# Patient Record
Sex: Male | Born: 1955
Health system: Southern US, Community
[De-identification: ages and names within clinical notes are randomized; demographics above are authoritative.]

## PROBLEM LIST (undated history)

## (undated) DIAGNOSIS — Z9289 Personal history of other medical treatment: Secondary | ICD-10-CM

## (undated) DIAGNOSIS — M1990 Unspecified osteoarthritis, unspecified site: Secondary | ICD-10-CM

## (undated) DIAGNOSIS — C61 Malignant neoplasm of prostate: Secondary | ICD-10-CM

## (undated) DIAGNOSIS — E785 Hyperlipidemia, unspecified: Secondary | ICD-10-CM

## (undated) DIAGNOSIS — T4145XA Adverse effect of unspecified anesthetic, initial encounter: Secondary | ICD-10-CM

## (undated) DIAGNOSIS — I2510 Atherosclerotic heart disease of native coronary artery without angina pectoris: Secondary | ICD-10-CM

## (undated) DIAGNOSIS — I519 Heart disease, unspecified: Secondary | ICD-10-CM

## (undated) DIAGNOSIS — I319 Disease of pericardium, unspecified: Secondary | ICD-10-CM

## (undated) DIAGNOSIS — C439 Malignant melanoma of skin, unspecified: Secondary | ICD-10-CM

## (undated) DIAGNOSIS — T8859XA Other complications of anesthesia, initial encounter: Secondary | ICD-10-CM

## (undated) DIAGNOSIS — K51919 Ulcerative colitis, unspecified with unspecified complications: Secondary | ICD-10-CM

## (undated) DIAGNOSIS — D500 Iron deficiency anemia secondary to blood loss (chronic): Secondary | ICD-10-CM

## (undated) HISTORY — DX: Ulcerative colitis, unspecified with unspecified complications: K51.919

## (undated) HISTORY — PX: MOHS SURGERY: SUR867

## (undated) HISTORY — PX: HIP ARTHROPLASTY: SHX981

## (undated) HISTORY — DX: Personal history of other medical treatment: Z92.89

## (undated) HISTORY — DX: Iron deficiency anemia secondary to blood loss (chronic): D50.0

## (undated) HISTORY — PX: CERVICAL SPINE SURGERY: SHX589

## (undated) HISTORY — PX: TONSILLECTOMY: SUR1361

---

## 2006-12-20 HISTORY — PX: CORONARY ANGIOPLASTY WITH STENT PLACEMENT: SHX49

## 2014-09-13 ENCOUNTER — Encounter: Payer: Self-pay | Admitting: Internal Medicine

## 2014-09-13 ENCOUNTER — Ambulatory Visit (INDEPENDENT_AMBULATORY_CARE_PROVIDER_SITE_OTHER): Payer: BC Managed Care – PPO | Admitting: Internal Medicine

## 2014-09-13 VITALS — BP 136/80 | HR 67 | Ht 64.0 in | Wt 184.0 lb

## 2014-09-13 DIAGNOSIS — I2510 Atherosclerotic heart disease of native coronary artery without angina pectoris: Secondary | ICD-10-CM

## 2014-09-13 DIAGNOSIS — E785 Hyperlipidemia, unspecified: Secondary | ICD-10-CM

## 2014-09-13 LAB — CBC
HEMATOCRIT: 39.8 % (ref 39.0–52.0)
HEMOGLOBIN: 13 g/dL (ref 13.0–17.0)
MCHC: 32.6 g/dL (ref 30.0–36.0)
MCV: 88.8 fl (ref 78.0–100.0)
PLATELETS: 312 10*3/uL (ref 150.0–400.0)
RBC: 4.48 Mil/uL (ref 4.22–5.81)
RDW: 14.3 % (ref 11.5–15.5)
WBC: 6.6 10*3/uL (ref 4.0–10.5)

## 2014-09-13 LAB — BASIC METABOLIC PANEL
BUN: 13 mg/dL (ref 6–23)
CHLORIDE: 106 meq/L (ref 96–112)
CO2: 26 mEq/L (ref 19–32)
Calcium: 9.4 mg/dL (ref 8.4–10.5)
Creatinine, Ser: 0.8 mg/dL (ref 0.4–1.5)
GFR: 104.11 mL/min (ref >60.00)
Glucose, Bld: 107 mg/dL — ABNORMAL HIGH (ref 70–99)
Potassium: 4.1 mEq/L (ref 3.5–5.1)
SODIUM: 138 meq/L (ref 135–145)

## 2014-09-13 LAB — LIPID PANEL
Cholesterol: 127 mg/dL (ref 0–200)
HDL: 36.8 mg/dL — AB (ref >39.00)
LDL Cholesterol: 65 mg/dL (ref 0–99)
NonHDL: 90.2
TRIGLYCERIDES: 127 mg/dL (ref 0.0–149.0)
Total CHOL/HDL Ratio: 3
VLDL: 25.4 mg/dL (ref 0.0–40.0)

## 2014-09-13 LAB — HEPATIC FUNCTION PANEL
ALBUMIN: 4 g/dL (ref 3.5–5.2)
ALK PHOS: 80 U/L (ref 39–117)
ALT: 25 U/L (ref 0–53)
AST: 24 U/L (ref 0–37)
BILIRUBIN DIRECT: 0.1 mg/dL (ref 0.0–0.3)
TOTAL PROTEIN: 7.3 g/dL (ref 6.0–8.3)
Total Bilirubin: 0.7 mg/dL (ref 0.2–1.2)

## 2014-09-13 MED ORDER — ASPIRIN EC 81 MG PO TBEC: 81.0000 mg | DELAYED_RELEASE_TABLET | Freq: Every day | ORAL | Status: DC

## 2014-09-13 NOTE — Progress Notes (Signed)
HPI Pateint is a 58 yo who relocated to [facility]  Had cardiologist in [place] Pain chest and jaw prior to cath in 2008  Admitted  :L heart cath done  Showed 50% LAD  90% D1; 100% RCA  LVEF 55%  Patient underwent PCI/stent Has had nuclear stress test since.  Normal Remained on chronic ASA and Plavix.    Memory problems a few yrs ago   Lasted about 30 min.  Testing done.  No recurrence  Hx of ulcerative colitis  Mild flare recently   Doubled up on asacol  Denies CP  No SOB  No dizziness  No palp Knees limit activity  Had hip replaced 1 year ago.  Has appt with Alvan Dame Also has appt with Cleaster Corin for UC No Known Allergies  Current Outpatient Prescriptions  Medication Sig Dispense Refill  . aspirin EC 325 MG tablet Take 325 mg by mouth daily.      . clopidogrel (PLAVIX) 75 MG tablet Take 75 mg by mouth daily.      Marland Kitchen desloratadine (CLARINEX) 5 MG tablet Take 5 mg by mouth daily.      . mesalamine (ASACOL) 400 MG EC tablet Take 400 mg by mouth 2 (two) times daily.      . metoprolol tartrate (LOPRESSOR) 25 MG tablet Take 1/2 tablet by mouth daily      . Multiple Vitamins-Minerals (CENTRUM SILVER PO) Take by mouth. 1 tablet daily      . Omega-3 Fatty Acids (FISH OIL) 1200 MG CAPS Take by mouth. Take 1 tablet by mouth twice a day      . simvastatin (ZOCOR) 40 MG tablet Take 40 mg by mouth daily.       No current facility-administered medications for this visit.    No past medical history on file.  No past surgical history on file.  History reviewed. No pertinent family history.  History   Social History  . Marital Status: Married    Spouse Name: N/A    Number of Children: N/A  . Years of Education: N/A   Occupational History  . Not on file.   Social History Main Topics  . Smoking status: Never Smoker   . Smokeless tobacco: Not on file  . Alcohol Use: Yes  . Drug Use: No  . Sexual Activity: Not on file   Other Topics Concern  . Not on file   Social History Narrative  . No  narrative on file    Review of Systems:  All systems reviewed.  They are negative to the above problem except as previously stated.  Vital Signs: BP 136/80  Pulse 67  Ht 5' 4"  (1.626 m)  Wt 184 lb (83.462 kg)  BMI 31.57 kg/m2  Physical Exam Patient is in AND  HEENT:  Normocephalic, atraumatic. EOMI, PERRLA.  Neck: JVP is normal.  No bruits.  Lungs: clear to auscultation. No rales no wheezes.  Heart: Regular rate and rhythm. Normal S1, S2. No S3.   No significant murmurs. PMI not displaced.  Abdomen:  Supple, nontender. Normal bowel sounds. No masses. No hepatomegaly.  Extremities:   Good distal pulses throughout. No lower extremity edema.  Musculoskeletal :moving all extremities.  Neuro:   alert and oriented x3.  CN II-XII grossly intact.  EKG  SR 67    Assessment and Plan:  1.  CAD  Doing well  Stay on ASA and Plavix  Cut ASA to 81 Check CBC  2.  HL  Check labs  3  DJD  Appt with ortho soon  If needs surgery I think he is OK to proceed with it from a cardiac standpoint  4  UC>  GI appt soon  5.  Question TIA  Few years ago  Nothing since  Follow    Recomm he increae his activity as tolerated.  Bikd  Pool  Will f/u in 1 year

## 2014-09-13 NOTE — Patient Instructions (Signed)
Your physician has recommended you make the following change in your medication:  1.) change aspirin to 81 mg tablet (enteric coated) once daily Your physician recommends that you return for lab work today (bmet, cbc, lipid, liver) Your physician wants you to follow-up in: 1 year with Dr. Harrington Challenger.  You will receive a reminder letter in the mail two months in advance. If you don't receive a letter, please call our office to schedule the follow-up appointment.

## 2014-10-03 ENCOUNTER — Other Ambulatory Visit: Payer: Self-pay

## 2014-10-03 MED ORDER — METOPROLOL TARTRATE 25 MG PO TABS: ORAL_TABLET | ORAL | Status: DC

## 2014-12-09 ENCOUNTER — Other Ambulatory Visit: Payer: Self-pay | Admitting: *Deleted

## 2014-12-09 MED ORDER — SIMVASTATIN 40 MG PO TABS: 40.0000 mg | ORAL_TABLET | Freq: Every day | ORAL | Status: DC

## 2015-02-21 ENCOUNTER — Other Ambulatory Visit: Payer: Self-pay

## 2015-02-21 MED ORDER — CLOPIDOGREL BISULFATE 75 MG PO TABS: 75.0000 mg | ORAL_TABLET | Freq: Every day | ORAL | Status: DC

## 2015-05-21 ENCOUNTER — Encounter (HOSPITAL_COMMUNITY): Payer: Self-pay | Admitting: *Deleted

## 2015-05-21 ENCOUNTER — Emergency Department (HOSPITAL_COMMUNITY)
Admission: EM | Admit: 2015-05-21 | Discharge: 2015-05-21 | Disposition: A | Discharge status: Home / Self Care | Payer: 59 | Attending: Emergency Medicine | Admitting: Emergency Medicine

## 2015-05-21 ENCOUNTER — Emergency Department (HOSPITAL_COMMUNITY): Payer: 59

## 2015-05-21 DIAGNOSIS — Z7902 Long term (current) use of antithrombotics/antiplatelets: Secondary | ICD-10-CM | POA: Diagnosis not present

## 2015-05-21 DIAGNOSIS — R109 Unspecified abdominal pain: Secondary | ICD-10-CM

## 2015-05-21 DIAGNOSIS — E876 Hypokalemia: Secondary | ICD-10-CM | POA: Insufficient documentation

## 2015-05-21 DIAGNOSIS — K5190 Ulcerative colitis, unspecified, without complications: Secondary | ICD-10-CM | POA: Insufficient documentation

## 2015-05-21 DIAGNOSIS — K51919 Ulcerative colitis, unspecified with unspecified complications: Secondary | ICD-10-CM

## 2015-05-21 DIAGNOSIS — Z79899 Other long term (current) drug therapy: Secondary | ICD-10-CM | POA: Diagnosis not present

## 2015-05-21 DIAGNOSIS — Z7982 Long term (current) use of aspirin: Secondary | ICD-10-CM | POA: Insufficient documentation

## 2015-05-21 DIAGNOSIS — Z9861 Coronary angioplasty status: Secondary | ICD-10-CM | POA: Diagnosis not present

## 2015-05-21 LAB — COMPREHENSIVE METABOLIC PANEL
ALBUMIN: 3.3 g/dL — AB (ref 3.5–5.0)
ALT: 16 U/L — AB (ref 17–63)
AST: 14 U/L — AB (ref 15–41)
Alkaline Phosphatase: 66 U/L (ref 38–126)
Anion gap: 12 (ref 5–15)
BUN: 8 mg/dL (ref 6–20)
CHLORIDE: 102 mmol/L (ref 101–111)
CO2: 24 mmol/L (ref 22–32)
Calcium: 9 mg/dL (ref 8.9–10.3)
Creatinine, Ser: 0.73 mg/dL (ref 0.61–1.24)
GFR calc Af Amer: >60 mL/min (ref >60)
GFR calc non Af Amer: >60 mL/min (ref >60)
Glucose, Bld: 126 mg/dL — ABNORMAL HIGH (ref 65–99)
POTASSIUM: 3 mmol/L — AB (ref 3.5–5.1)
SODIUM: 138 mmol/L (ref 135–145)
Total Bilirubin: 0.7 mg/dL (ref 0.3–1.2)
Total Protein: 6.6 g/dL (ref 6.5–8.1)

## 2015-05-21 LAB — CBC WITH DIFFERENTIAL/PLATELET
Basophils Absolute: 0 10*3/uL (ref 0.0–0.1)
Basophils Relative: 0 % (ref 0–1)
EOS ABS: 0.1 10*3/uL (ref 0.0–0.7)
Eosinophils Relative: 1 % (ref 0–5)
HCT: 37.7 % — ABNORMAL LOW (ref 39.0–52.0)
HEMOGLOBIN: 12.7 g/dL — AB (ref 13.0–17.0)
Lymphocytes Relative: 14 % (ref 12–46)
Lymphs Abs: 1.3 10*3/uL (ref 0.7–4.0)
MCH: 28.5 pg (ref 26.0–34.0)
MCHC: 33.7 g/dL (ref 30.0–36.0)
MCV: 84.7 fL (ref 78.0–100.0)
Monocytes Absolute: 1.7 10*3/uL — ABNORMAL HIGH (ref 0.1–1.0)
Monocytes Relative: 18 % — ABNORMAL HIGH (ref 3–12)
Neutro Abs: 6.2 10*3/uL (ref 1.7–7.7)
Neutrophils Relative %: 67 % (ref 43–77)
Platelets: 375 10*3/uL (ref 150–400)
RBC: 4.45 MIL/uL (ref 4.22–5.81)
RDW: 12.7 % (ref 11.5–15.5)
WBC: 9.3 10*3/uL (ref 4.0–10.5)

## 2015-05-21 LAB — ABO/RH: ABO/RH(D): B POS

## 2015-05-21 LAB — LIPASE, BLOOD: LIPASE: 20 U/L — AB (ref 22–51)

## 2015-05-21 LAB — TYPE AND SCREEN
ABO/RH(D): B POS
Antibody Screen: NEGATIVE

## 2015-05-21 MED ORDER — ONDANSETRON 8 MG PO TBDP: 8.0000 mg | ORAL_TABLET | Freq: Three times a day (TID) | ORAL | Status: DC | PRN

## 2015-05-21 MED ORDER — OXYCODONE-ACETAMINOPHEN 5-325 MG PO TABS: 1.0000 | ORAL_TABLET | ORAL | Status: DC | PRN

## 2015-05-21 MED ORDER — IOHEXOL 300 MG/ML  SOLN
100.0000 mL | Freq: Once | INTRAMUSCULAR | Status: AC | PRN
  Administered 2015-05-21: 100 mL via INTRAVENOUS

## 2015-05-21 MED ORDER — ONDANSETRON HCL 4 MG/2ML IJ SOLN
4.0000 mg | Freq: Once | INTRAMUSCULAR | Status: AC
  Administered 2015-05-21: 4 mg via INTRAVENOUS
  Filled 2015-05-21: qty 2

## 2015-05-21 MED ORDER — POTASSIUM CHLORIDE CRYS ER 20 MEQ PO TBCR
40.0000 meq | EXTENDED_RELEASE_TABLET | Freq: Once | ORAL | Status: AC
  Administered 2015-05-21: 40 meq via ORAL
  Filled 2015-05-21: qty 2

## 2015-05-21 MED ORDER — IOHEXOL 300 MG/ML  SOLN: 25.0000 mL | Freq: Once | INTRAMUSCULAR | Status: DC | PRN

## 2015-05-21 MED ORDER — PREDNISONE 10 MG PO TABS: 60.0000 mg | ORAL_TABLET | Freq: Every day | ORAL | Status: DC

## 2015-05-21 MED ORDER — POTASSIUM CHLORIDE 10 MEQ/100ML IV SOLN
10.0000 meq | Freq: Once | INTRAVENOUS | Status: AC
  Administered 2015-05-21: 10 meq via INTRAVENOUS
  Filled 2015-05-21: qty 100

## 2015-05-21 MED ORDER — SODIUM CHLORIDE 0.9 % IV BOLUS (SEPSIS)
2000.0000 mL | Freq: Once | INTRAVENOUS | Status: AC
  Administered 2015-05-21: 2000 mL via INTRAVENOUS

## 2015-05-21 MED ORDER — HYDROMORPHONE HCL 1 MG/ML IJ SOLN
1.0000 mg | Freq: Once | INTRAMUSCULAR | Status: AC
  Administered 2015-05-21: 1 mg via INTRAVENOUS
  Filled 2015-05-21: qty 1

## 2015-05-21 MED ORDER — METHYLPREDNISOLONE SODIUM SUCC 125 MG IJ SOLR
125.0000 mg | Freq: Once | INTRAMUSCULAR | Status: AC
  Administered 2015-05-21: 125 mg via INTRAVENOUS
  Filled 2015-05-21: qty 2

## 2015-05-21 MED ORDER — HYDROMORPHONE HCL 1 MG/ML IJ SOLN
1.0000 mg | Freq: Once | INTRAMUSCULAR | Status: AC
Start: 2015-05-21 — End: 2015-05-21
  Administered 2015-05-21: 1 mg via INTRAVENOUS
  Filled 2015-05-21: qty 1

## 2015-05-21 NOTE — ED Provider Notes (Signed)
Pt reevaluated - has recurrent cramping pain and nausea - redosed with m eds - solumedrol added, pt in agreement with plan - fluids, meds and trial at home.  5:09 PM   Noemi Chapel, MD 05/21/15 1709

## 2015-05-21 NOTE — ED Provider Notes (Signed)
CSN: [id number]     Arrival date & time 05/21/15  1245 History   First MD Initiated Contact with Patient 05/21/15 1307     Chief Complaint  Patient presents with  . Rectal Bleeding  . Abdominal Pain      HPI Patient reports nausea and abdominal cramping over the past 10 days.  Is also reported some bright red blood from his rectum.  He has a history of ulcerative colitis or was recently started on increased prednisone.  He is unable to see his GI doctor is GI doctors on vacation.  He states his been several years since his had a flare of his ulcerative colitis.  He also reports new left-sided abdominal discomfort and pain.  No history of diverticulitis or diverticulosis.  The patient.  His pain is mild to moderate in severity.  His rectal bleeding seems to be slowing down.  He is on Plavix.  No other complaints.   Past Medical History  Diagnosis Date  . Ulcerative colitis    Past Surgical History  Procedure Laterality Date  . Coronary angioplasty with stent placement    . Hip surgery     No family history on file. History  Substance Use Topics  . Smoking status: Never Smoker   . Smokeless tobacco: Not on file  . Alcohol Use: Yes    Review of Systems  All other systems reviewed and are negative.     Allergies  Review of patient's allergies indicates no known allergies.  Home Medications   Prior to Admission medications   Medication Sig Start Date End Date Taking? Authorizing Provider  aspirin EC 81 MG tablet Take 1 tablet (81 mg total) by mouth daily. 09/13/14   Fay Records, MD  clopidogrel (PLAVIX) 75 MG tablet Take 1 tablet (75 mg total) by mouth daily. 02/21/15   Fay Records, MD  desloratadine (CLARINEX) 5 MG tablet Take 5 mg by mouth daily.    Historical Provider, MD  mesalamine (ASACOL) 400 MG EC tablet Take 400 mg by mouth 2 (two) times daily.    Historical Provider, MD  metoprolol tartrate (LOPRESSOR) 25 MG tablet Take 1/2 tablet by mouth daily 10/03/14   Fay Records, MD  Multiple Vitamins-Minerals (CENTRUM SILVER PO) Take by mouth. 1 tablet daily    Historical Provider, MD  Omega-3 Fatty Acids (FISH OIL) 1200 MG CAPS Take by mouth. Take 1 tablet by mouth twice a day    Historical Provider, MD  simvastatin (ZOCOR) 40 MG tablet Take 1 tablet (40 mg total) by mouth daily. 12/09/14   Fay Records, MD   BP 138/84 mmHg  Pulse 91  Temp(Src) 98.4 F (36.9 C) (Oral)  Resp 18  SpO2 98% Physical Exam  Constitutional: He is oriented to person, place, and time. He appears well-developed and well-nourished.  HENT:  Head: Normocephalic and atraumatic.  Eyes: EOM are normal.  Neck: Normal range of motion.  Cardiovascular: Normal rate, regular rhythm, normal heart sounds and intact distal pulses.   Pulmonary/Chest: Effort normal and breath sounds normal. No respiratory distress.  Abdominal: Soft. He exhibits no distension.  Mild left lower quadrant abdominal tenderness.  No peritoneal signs.  Musculoskeletal: Normal range of motion.  Neurological: He is alert and oriented to person, place, and time.  Skin: Skin is warm and dry.  Psychiatric: He has a normal mood and affect. Judgment normal.  Nursing note and vitals reviewed.   ED Course  Procedures (including critical care time)  Labs Review Labs Reviewed  CBC WITH DIFFERENTIAL/PLATELET - Abnormal; Notable for the following:    Hemoglobin 12.7 (*)    HCT 37.7 (*)    Monocytes Relative 18 (*)    Monocytes Absolute 1.7 (*)    All other components within normal limits  COMPREHENSIVE METABOLIC PANEL - Abnormal; Notable for the following:    Potassium 3.0 (*)    Glucose, Bld 126 (*)    Albumin 3.3 (*)    AST 14 (*)    ALT 16 (*)    All other components within normal limits  LIPASE, BLOOD - Abnormal; Notable for the following:    Lipase 20 (*)    All other components within normal limits  TYPE AND SCREEN  ABO/RH    Imaging Review No results found.   EKG Interpretation None      MDM    Final diagnoses:  None    Mild hypokalemia.  Patient's potassium will be replaced.  Home with nausea medicine and pain medication.  CT scan still pending at this time.  His CT scan is normal I suspect the patient can be discharged home safely to follow-up with his GI doctor.  He was CT scan is without acute pathology will give him a dose of IV Solu-Medrol and sent home with a dose of steroids for 5 days.  Care to Dr Sabra Heck to follow up on CT imaging    Jola Schmidt, MD 05/21/15 [phone]

## 2015-05-21 NOTE — ED Notes (Signed)
Called CT to verify pt almost done with contrast

## 2015-05-21 NOTE — ED Notes (Signed)
Pt states that he has been sent from GI md for eval of rectal bleeding, nausea and abdominal cramping for 10 days.

## 2015-06-26 ENCOUNTER — Telehealth: Payer: Self-pay | Admitting: Internal Medicine

## 2015-06-26 NOTE — Telephone Encounter (Signed)
pt walked and wants to know if he can start the aspirin EC 81 MG tablet & clopidogrel (PLAVIX) 75 MG tablet back as he had a medical issue that caused him to have to hold those meds. Please call him @ [phone]

## 2015-06-27 NOTE — Telephone Encounter (Signed)
Called the patient. He said his GI Dr. Watt Climes and Dr. Harrington Challenger spoke about 3 weeks ago and determined he should hold asa and plavix due to his flare of ulcerative colitis.  He is still tapering his steroids, but states he feels "100 percent back to normal" and wants to know if it is time to restart asa and plavix.  Pt is aware I am forwarding to Dr. Harrington Challenger for recommendation and will call him once I get a reply.

## 2015-06-30 NOTE — Telephone Encounter (Signed)
Restart aspirin.   Call in 2 wks with how feeling

## 2015-07-02 NOTE — Telephone Encounter (Signed)
Informed patient to restart aspirin only.  He will not restart plavix. He will call with update on how he is feeling in couple weeks. Otherwise, he has appt in September with Dr. Harrington Challenger.

## 2015-07-18 ENCOUNTER — Other Ambulatory Visit: Payer: Self-pay | Admitting: Internal Medicine

## 2015-09-10 DIAGNOSIS — K51919 Ulcerative colitis, unspecified with unspecified complications: Secondary | ICD-10-CM | POA: Insufficient documentation

## 2015-09-10 DIAGNOSIS — D500 Iron deficiency anemia secondary to blood loss (chronic): Secondary | ICD-10-CM

## 2015-09-10 HISTORY — DX: Ulcerative colitis, unspecified with unspecified complications: K51.919

## 2015-09-10 HISTORY — DX: Iron deficiency anemia secondary to blood loss (chronic): D50.0

## 2015-09-15 ENCOUNTER — Ambulatory Visit (INDEPENDENT_AMBULATORY_CARE_PROVIDER_SITE_OTHER): Payer: 59 | Admitting: Internal Medicine

## 2015-09-15 VITALS — BP 138/70 | Ht 64.0 in | Wt 183.0 lb

## 2015-09-15 DIAGNOSIS — Z7982 Long term (current) use of aspirin: Secondary | ICD-10-CM | POA: Diagnosis not present

## 2015-09-15 DIAGNOSIS — R609 Edema, unspecified: Secondary | ICD-10-CM

## 2015-09-15 DIAGNOSIS — E785 Hyperlipidemia, unspecified: Secondary | ICD-10-CM

## 2015-09-15 LAB — BASIC METABOLIC PANEL
BUN: 10 mg/dL (ref 6–23)
CO2: 27 mEq/L (ref 19–32)
Calcium: 9.3 mg/dL (ref 8.4–10.5)
Chloride: 108 mEq/L (ref 96–112)
Creatinine, Ser: 0.82 mg/dL (ref 0.40–1.50)
GFR: 102.29 mL/min (ref >60.00)
GLUCOSE: 116 mg/dL — AB (ref 70–99)
Potassium: 4.1 mEq/L (ref 3.5–5.1)
SODIUM: 143 meq/L (ref 135–145)

## 2015-09-15 LAB — CBC
HEMATOCRIT: 39.8 % (ref 39.0–52.0)
Hemoglobin: 13.1 g/dL (ref 13.0–17.0)
MCHC: 32.8 g/dL (ref 30.0–36.0)
MCV: 84.4 fl (ref 78.0–100.0)
Platelets: 252 10*3/uL (ref 150.0–400.0)
RBC: 4.72 Mil/uL (ref 4.22–5.81)
RDW: 13.9 % (ref 11.5–15.5)
WBC: 7.3 10*3/uL (ref 4.0–10.5)

## 2015-09-15 LAB — LIPID PANEL
CHOL/HDL RATIO: 3
CHOLESTEROL: 115 mg/dL (ref 0–200)
HDL: 44.9 mg/dL (ref >39.00)
LDL CALC: 51 mg/dL (ref 0–99)
NonHDL: 70.45
Triglycerides: 99 mg/dL (ref 0.0–149.0)
VLDL: 19.8 mg/dL (ref 0.0–40.0)

## 2015-09-15 LAB — AST: AST: 28 U/L (ref 0–37)

## 2015-09-15 LAB — BRAIN NATRIURETIC PEPTIDE: Pro B Natriuretic peptide (BNP): 29 pg/mL (ref 0.0–100.0)

## 2015-09-15 MED ORDER — METOPROLOL TARTRATE 25 MG PO TABS: 12.5000 mg | ORAL_TABLET | Freq: Every day | ORAL | Status: DC

## 2015-09-15 MED ORDER — SIMVASTATIN 40 MG PO TABS: 40.0000 mg | ORAL_TABLET | Freq: Every day | ORAL | Status: DC

## 2015-09-15 NOTE — Progress Notes (Signed)
Cardiology Office Note   Date:  09/15/2015   ID:  Tyler Estrada, DOB June 07, 1956, MRN [id number]  PCP:  Eloise Levels, NP  Cardiologist:   Dorris Carnes, MD   No chief complaint on file.  F/U of CAD     History of Present Illness: Tyler Estrada is a 59 y.o. male with a history of  Had cardiologist in [facility] Pain chest and jaw prior to cath in 2008 Admitted :L heart cath done Showed 50% LAD 90% D1; 100% RCA LVEF 55% Patient underwent PCI/stent Has had nuclear stress test since. Normal Remained on chronic ASA and Plavix.  I saw him in Sept 2015  Flare of Ulcerative colitis in June  Worse spell ever  Had edema after  Had to get lasix for 1 wk due to ankle swelling  20 mg  Has bad knee  Mobiltiy down  Deneis CP No SOB  No edema now  Is not very active due to knee  Has already had hip replacement    Current Outpatient Prescriptions  Medication Sig Dispense Refill  . aspirin EC 81 MG tablet Take 1 tablet (81 mg total) by mouth daily. 90 tablet 3  . desloratadine (CLARINEX) 5 MG tablet Take 5 mg by mouth daily.    . mesalamine (ASACOL) 400 MG EC tablet Take 400 mg by mouth 6 (six) times daily.     . metoprolol tartrate (LOPRESSOR) 25 MG tablet TAKE HALF TABLET BY MOUTH DAILY 45 tablet 1  . Multiple Vitamins-Minerals (CENTRUM SILVER PO) Take by mouth. 1 tablet daily    . Omega-3 Fatty Acids (FISH OIL) 1200 MG CAPS Take by mouth. Take 1 tablet by mouth twice a day    . ondansetron (ZOFRAN ODT) 8 MG disintegrating tablet Take 1 tablet (8 mg total) by mouth every 8 (eight) hours as needed for nausea or vomiting. 12 tablet 0  . oxyCODONE-acetaminophen (PERCOCET/ROXICET) 5-325 MG per tablet Take 1 tablet by mouth every 4 (four) hours as needed for severe pain. 15 tablet 0  . predniSONE (DELTASONE) 10 MG tablet Take 6 tablets (60 mg total) by mouth daily. 30 tablet 0  . simvastatin (ZOCOR) 40 MG tablet Take 1 tablet (40 mg total) by mouth daily. 30 tablet 8   No current  facility-administered medications for this visit.    Allergies:   Review of patient's allergies indicates no known allergies.   Past Medical History  Diagnosis Date  . Ulcerative colitis   . Iron deficiency anemia due to chronic blood loss 09/10/2015  . Ulcerative colitis with complication 2/84/1324    Past Surgical History  Procedure Laterality Date  . Coronary angioplasty with stent placement    . Hip surgery       Social History:  The patient  reports that he has never smoked. He does not have any smokeless tobacco history on file. He reports that he drinks alcohol. He reports that he does not use illicit drugs.   Family History:  The patient's family history includes CAD in his father; Heart murmur in his sister; Hypertension in his brother and mother; Lymphoma in his mother.    ROS:  Please see the history of present illness. All other systems are reviewed and  Negative to the above problem except as noted.    PHYSICAL EXAM: VS:  BP 138/70 mmHg  Ht 5' 4"  (1.626 m)  Wt 183 lb (83.008 kg)  BMI 31.40 kg/m2  GEN: Well nourished, well developed, in no acute distress HEENT:  normal Neck: no JVD, carotid bruits, or masses Cardiac: RRR; no murmurs, rubs, or gallops,no edema  Respiratory:  clear to auscultation bilaterally, normal work of breathing GI: soft, nontender, nondistended, + BS  No hepatomegaly  MS: no deformity Moving all extremities   Skin: warm and dry, no rash Neuro:  Strength and sensation are intact Psych: euthymic mood, full affect   EKG:  EKG is ordered today.  SR 66 bpm   Lipid Panel    Component Value Date/Time   CHOL 127 09/13/2014 1111   TRIG 127.0 09/13/2014 1111   HDL 36.80* 09/13/2014 1111   CHOLHDL 3 09/13/2014 1111   VLDL 25.4 09/13/2014 1111   LDLCALC 65 09/13/2014 1111      Wt Readings from Last 3 Encounters:  09/15/15 183 lb (83.008 kg)  09/13/14 184 lb (83.462 kg)      ASSESSMENT AND PLAN:  1.  CAD  NO symptoms of angina    tTransient edema in June  Got IV fluids and steroids  None now   Continue current regiimen  Check labs  2. HL Check labs  From a cardiac standpoint I think he would be at low risk for surgery and OK to proceed.    F/U in 1 year         Signed, Dorris Carnes, MD  09/15/2015 8:36 AM    [address], [place], [place]  [address] Phone: [phone]; Fax: [phone]

## 2015-09-15 NOTE — Patient Instructions (Signed)
Your physician recommends that you continue on your current medications as directed. Please refer to the Current Medication list given to you today. Your physician recommends that you return for lab work (CBC, BMET, BNP, LIPIDS, AST)  Your physician wants you to follow-up in: [place].  You will receive a reminder letter in the mail two months in advance. If you don't receive a letter, please call our office to schedule the follow-up appointment.

## 2015-11-18 ENCOUNTER — Encounter (HOSPITAL_COMMUNITY): Payer: Self-pay | Admitting: Emergency Medicine

## 2015-11-18 ENCOUNTER — Encounter (HOSPITAL_COMMUNITY)
Admission: EM | Disposition: A | Discharge status: Home / Self Care | Payer: Self-pay | Source: Home / Self Care | Attending: Emergency Medicine

## 2015-11-18 ENCOUNTER — Emergency Department (HOSPITAL_COMMUNITY): Payer: 59

## 2015-11-18 ENCOUNTER — Observation Stay (HOSPITAL_COMMUNITY)
Admission: EM | Admit: 2015-11-18 | Discharge: 2015-11-19 | DRG: 287 | Disposition: A | Discharge status: Home / Self Care | Payer: 59 | Attending: Cardiology | Admitting: Cardiology

## 2015-11-18 DIAGNOSIS — R079 Chest pain, unspecified: Secondary | ICD-10-CM

## 2015-11-18 DIAGNOSIS — I2584 Coronary atherosclerosis due to calcified coronary lesion: Secondary | ICD-10-CM | POA: Insufficient documentation

## 2015-11-18 DIAGNOSIS — K51819 Other ulcerative colitis with unspecified complications: Secondary | ICD-10-CM | POA: Diagnosis not present

## 2015-11-18 DIAGNOSIS — Z9861 Coronary angioplasty status: Secondary | ICD-10-CM | POA: Diagnosis not present

## 2015-11-18 DIAGNOSIS — Y831 Surgical operation with implant of artificial internal device as the cause of abnormal reaction of the patient, or of later complication, without mention of misadventure at the time of the procedure: Secondary | ICD-10-CM | POA: Insufficient documentation

## 2015-11-18 DIAGNOSIS — Z7982 Long term (current) use of aspirin: Secondary | ICD-10-CM | POA: Diagnosis not present

## 2015-11-18 DIAGNOSIS — I249 Acute ischemic heart disease, unspecified: Secondary | ICD-10-CM | POA: Diagnosis not present

## 2015-11-18 DIAGNOSIS — I2510 Atherosclerotic heart disease of native coronary artery without angina pectoris: Secondary | ICD-10-CM | POA: Insufficient documentation

## 2015-11-18 DIAGNOSIS — Z79899 Other long term (current) drug therapy: Secondary | ICD-10-CM | POA: Insufficient documentation

## 2015-11-18 DIAGNOSIS — I309 Acute pericarditis, unspecified: Principal | ICD-10-CM | POA: Insufficient documentation

## 2015-11-18 DIAGNOSIS — Z8249 Family history of ischemic heart disease and other diseases of the circulatory system: Secondary | ICD-10-CM | POA: Insufficient documentation

## 2015-11-18 DIAGNOSIS — R0602 Shortness of breath: Secondary | ICD-10-CM | POA: Insufficient documentation

## 2015-11-18 DIAGNOSIS — E785 Hyperlipidemia, unspecified: Secondary | ICD-10-CM | POA: Diagnosis not present

## 2015-11-18 DIAGNOSIS — R748 Abnormal levels of other serum enzymes: Secondary | ICD-10-CM | POA: Diagnosis not present

## 2015-11-18 DIAGNOSIS — K51919 Ulcerative colitis, unspecified with unspecified complications: Secondary | ICD-10-CM | POA: Diagnosis present

## 2015-11-18 DIAGNOSIS — K5190 Ulcerative colitis, unspecified, without complications: Secondary | ICD-10-CM | POA: Diagnosis not present

## 2015-11-18 DIAGNOSIS — T82858A Stenosis of vascular prosthetic devices, implants and grafts, initial encounter: Secondary | ICD-10-CM | POA: Diagnosis not present

## 2015-11-18 DIAGNOSIS — R7989 Other specified abnormal findings of blood chemistry: Secondary | ICD-10-CM

## 2015-11-18 DIAGNOSIS — R778 Other specified abnormalities of plasma proteins: Secondary | ICD-10-CM

## 2015-11-18 HISTORY — DX: Hyperlipidemia, unspecified: E78.5

## 2015-11-18 HISTORY — DX: Atherosclerotic heart disease of native coronary artery without angina pectoris: I25.10

## 2015-11-18 HISTORY — DX: Disease of pericardium, unspecified: I31.9

## 2015-11-18 HISTORY — PX: CARDIAC CATHETERIZATION: SHX172

## 2015-11-18 LAB — BASIC METABOLIC PANEL
Anion gap: 8 (ref 5–15)
BUN: 12 mg/dL (ref 6–20)
CHLORIDE: 105 mmol/L (ref 101–111)
CO2: 26 mmol/L (ref 22–32)
CREATININE: 0.82 mg/dL (ref 0.61–1.24)
Calcium: 9.6 mg/dL (ref 8.9–10.3)
GFR calc non Af Amer: >60 mL/min (ref >60)
Glucose, Bld: 124 mg/dL — ABNORMAL HIGH (ref 65–99)
Potassium: 4.4 mmol/L (ref 3.5–5.1)
Sodium: 139 mmol/L (ref 135–145)

## 2015-11-18 LAB — CBC
HCT: 40 % (ref 39.0–52.0)
HEMOGLOBIN: 13 g/dL (ref 13.0–17.0)
MCH: 28.3 pg (ref 26.0–34.0)
MCHC: 32.5 g/dL (ref 30.0–36.0)
MCV: 87 fL (ref 78.0–100.0)
PLATELETS: 218 10*3/uL (ref 150–400)
RBC: 4.6 MIL/uL (ref 4.22–5.81)
RDW: 14 % (ref 11.5–15.5)
WBC: 12.9 10*3/uL — ABNORMAL HIGH (ref 4.0–10.5)

## 2015-11-18 LAB — PROTIME-INR
INR: 1.15 (ref 0.00–1.49)
PROTHROMBIN TIME: 14.9 s (ref 11.6–15.2)

## 2015-11-18 LAB — APTT: APTT: 26 s (ref 24–37)

## 2015-11-18 LAB — MRSA PCR SCREENING: MRSA by PCR: NEGATIVE

## 2015-11-18 LAB — I-STAT TROPONIN, ED: TROPONIN I, POC: 0 ng/mL (ref 0.00–0.08)

## 2015-11-18 LAB — TROPONIN I: Troponin I: 0.07 ng/mL — ABNORMAL HIGH (ref <0.031)

## 2015-11-18 SURGERY — LEFT HEART CATH AND CORONARY ANGIOGRAPHY
Anesthesia: LOCAL

## 2015-11-18 MED ORDER — FENTANYL CITRATE (PF) 100 MCG/2ML IJ SOLN
INTRAMUSCULAR | Status: AC
  Filled 2015-11-18: qty 2

## 2015-11-18 MED ORDER — SODIUM CHLORIDE 0.9 % IJ SOLN: 3.0000 mL | INTRAMUSCULAR | Status: DC | PRN

## 2015-11-18 MED ORDER — SODIUM CHLORIDE 0.9 % IJ SOLN
3.0000 mL | Freq: Two times a day (BID) | INTRAMUSCULAR | Status: DC
  Administered 2015-11-18: 3 mL via INTRAVENOUS

## 2015-11-18 MED ORDER — HEPARIN SODIUM (PORCINE) 1000 UNIT/ML IJ SOLN
INTRAMUSCULAR | Status: DC | PRN
  Administered 2015-11-18: 3000 [IU] via INTRAVENOUS

## 2015-11-18 MED ORDER — ONDANSETRON HCL 4 MG/2ML IJ SOLN: 4.0000 mg | Freq: Four times a day (QID) | INTRAMUSCULAR | Status: DC | PRN

## 2015-11-18 MED ORDER — SODIUM CHLORIDE 0.9 % IV SOLN: INTRAVENOUS | Status: AC

## 2015-11-18 MED ORDER — NITROGLYCERIN 0.4 MG SL SUBL: 0.4000 mg | SUBLINGUAL_TABLET | SUBLINGUAL | Status: DC | PRN

## 2015-11-18 MED ORDER — FENTANYL CITRATE (PF) 100 MCG/2ML IJ SOLN
INTRAMUSCULAR | Status: DC | PRN
  Administered 2015-11-18: 25 ug via INTRAVENOUS

## 2015-11-18 MED ORDER — HEPARIN SODIUM (PORCINE) 1000 UNIT/ML IJ SOLN
INTRAMUSCULAR | Status: AC
  Filled 2015-11-18: qty 1

## 2015-11-18 MED ORDER — VERAPAMIL HCL 2.5 MG/ML IV SOLN
INTRAVENOUS | Status: DC | PRN
  Administered 2015-11-18: 10 mL via INTRA_ARTERIAL

## 2015-11-18 MED ORDER — LORATADINE 10 MG PO TABS
10.0000 mg | ORAL_TABLET | Freq: Every day | ORAL | Status: DC
  Administered 2015-11-19: 10 mg via ORAL
  Filled 2015-11-18: qty 1

## 2015-11-18 MED ORDER — METOPROLOL TARTRATE 25 MG PO TABS: 25.0000 mg | ORAL_TABLET | Freq: Once | ORAL | Status: DC

## 2015-11-18 MED ORDER — ASPIRIN EC 81 MG PO TBEC
81.0000 mg | DELAYED_RELEASE_TABLET | Freq: Every day | ORAL | Status: DC
  Administered 2015-11-19: 81 mg via ORAL
  Filled 2015-11-18: qty 1

## 2015-11-18 MED ORDER — VERAPAMIL HCL 2.5 MG/ML IV SOLN
INTRAVENOUS | Status: AC
  Filled 2015-11-18: qty 2

## 2015-11-18 MED ORDER — METOPROLOL TARTRATE 12.5 MG HALF TABLET
12.5000 mg | ORAL_TABLET | Freq: Every day | ORAL | Status: DC
  Administered 2015-11-19: 12.5 mg via ORAL
  Filled 2015-11-18: qty 1

## 2015-11-18 MED ORDER — ACETAMINOPHEN 325 MG PO TABS: 650.0000 mg | ORAL_TABLET | ORAL | Status: DC | PRN

## 2015-11-18 MED ORDER — MESALAMINE 400 MG PO CPDR
1600.0000 mg | DELAYED_RELEASE_CAPSULE | Freq: Two times a day (BID) | ORAL | Status: DC
  Administered 2015-11-18 – 2015-11-19 (×2): 1600 mg via ORAL
  Filled 2015-11-18 (×3): qty 4

## 2015-11-18 MED ORDER — HEPARIN (PORCINE) IN NACL 2-0.9 UNIT/ML-% IJ SOLN
INTRAMUSCULAR | Status: AC
  Filled 2015-11-18: qty 1500

## 2015-11-18 MED ORDER — SODIUM CHLORIDE 0.9 % IV SOLN
INTRAVENOUS | Status: DC
  Administered 2015-11-18: 15:00:00 via INTRAVENOUS

## 2015-11-18 MED ORDER — SODIUM CHLORIDE 0.9 % IV SOLN: 250.0000 mL | INTRAVENOUS | Status: DC | PRN

## 2015-11-18 MED ORDER — ASPIRIN 81 MG PO CHEW
324.0000 mg | CHEWABLE_TABLET | Freq: Once | ORAL | Status: AC
Start: 2015-11-18 — End: 2015-11-18
  Administered 2015-11-18: 324 mg via ORAL
  Filled 2015-11-18: qty 4

## 2015-11-18 MED ORDER — IOHEXOL 350 MG/ML SOLN
INTRAVENOUS | Status: DC | PRN
  Administered 2015-11-18: 80 mL via INTRA_ARTERIAL

## 2015-11-18 MED ORDER — HEPARIN SODIUM (PORCINE) 5000 UNIT/ML IJ SOLN
4000.0000 [IU] | INTRAMUSCULAR | Status: AC
  Administered 2015-11-18: 4000 [IU] via INTRAVENOUS
  Filled 2015-11-18: qty 1

## 2015-11-18 MED ORDER — LIDOCAINE HCL (PF) 1 % IJ SOLN
INTRAMUSCULAR | Status: DC | PRN
  Administered 2015-11-18: 5 mL

## 2015-11-18 MED ORDER — ASPIRIN 81 MG PO CHEW: 324.0000 mg | CHEWABLE_TABLET | Freq: Once | ORAL | Status: DC

## 2015-11-18 MED ORDER — MORPHINE SULFATE (PF) 2 MG/ML IV SOLN
2.0000 mg | INTRAVENOUS | Status: DC | PRN
  Administered 2015-11-18 (×2): 2 mg via INTRAVENOUS
  Filled 2015-11-18: qty 1

## 2015-11-18 MED ORDER — MORPHINE SULFATE (PF) 2 MG/ML IV SOLN
INTRAVENOUS | Status: AC
  Filled 2015-11-18: qty 1

## 2015-11-18 MED ORDER — SIMVASTATIN 40 MG PO TABS
40.0000 mg | ORAL_TABLET | Freq: Every day | ORAL | Status: DC
  Administered 2015-11-19: 40 mg via ORAL
  Filled 2015-11-18: qty 1

## 2015-11-18 MED ORDER — PNEUMOCOCCAL VAC POLYVALENT 25 MCG/0.5ML IJ INJ
0.5000 mL | INJECTION | INTRAMUSCULAR | Status: DC
  Filled 2015-11-18: qty 0.5

## 2015-11-18 MED ORDER — NITROGLYCERIN 1 MG/10 ML FOR IR/CATH LAB
INTRA_ARTERIAL | Status: DC | PRN
  Administered 2015-11-18: 16:00:00

## 2015-11-18 MED ORDER — MIDAZOLAM HCL 2 MG/2ML IJ SOLN
INTRAMUSCULAR | Status: DC | PRN
  Administered 2015-11-18: 2 mg via INTRAVENOUS

## 2015-11-18 MED ORDER — MIDAZOLAM HCL 2 MG/2ML IJ SOLN
INTRAMUSCULAR | Status: AC
  Filled 2015-11-18: qty 2

## 2015-11-18 MED ORDER — LIDOCAINE HCL (PF) 1 % IJ SOLN
INTRAMUSCULAR | Status: AC
  Filled 2015-11-18: qty 30

## 2015-11-18 MED ORDER — IOHEXOL 350 MG/ML SOLN
100.0000 mL | Freq: Once | INTRAVENOUS | Status: AC | PRN
  Administered 2015-11-18: 100 mL via INTRAVENOUS

## 2015-11-18 MED ORDER — OXYCODONE-ACETAMINOPHEN 5-325 MG PO TABS
1.0000 | ORAL_TABLET | ORAL | Status: DC | PRN
  Administered 2015-11-18 – 2015-11-19 (×2): 1 via ORAL
  Filled 2015-11-18 (×2): qty 1

## 2015-11-18 MED ORDER — NITROGLYCERIN 1 MG/10 ML FOR IR/CATH LAB
INTRA_ARTERIAL | Status: AC
  Filled 2015-11-18: qty 10

## 2015-11-18 SURGICAL SUPPLY — 11 items

## 2015-11-18 NOTE — H&P (Signed)
Patient ID: Tyler Estrada MRN: [id number], DOB/AGE: [birth year]   Admit date: 11/18/2015  Primary Physician: Eloise Levels, NP Primary Cardiologist: Dr. Harrington Challenger  [id number] Tyler Estrada is a 59 y.o. male with past medical history of CAD (s/p DES to RCA in 2008) and Ulcerative Colitis who presents to [facility] ED on 11/18/2015 for an episode of chest pain which started at 1100 today.   Patient reports he was sitting down at work when all of a sudden he developed a pressure across his upper chest. He reports it was a 9/10 initially and associated with shortness of breath and fatigue. Denies any diaphoresis or radiating pain. He took SL NTG which then caused him to feel nauseated. He noticed little relief with the first NTG, then took two more 5 minutes apart which he says helped alleviate his pain.   At time of examination, he still reports having chest pressure which he rates as a 6/10. He denies any shortness of breath, nausea, or vomiting at this time. Reports he feels as if he cannot take a deep breath. Positional changes do not influence his pain.  Denies any chest pain in the recent months prior to today's episode. Reports having left arm pain and jaw pain in 2008 when he required stent placement, which he does not have currently. Reports good compliance with his medications. Had been on ASA and Plavix since his stent placement in 2008, however his Plavix was stopped earlier this year due to GI bleeding associated with his Ulcerative Colitis. Notes decreased physical activity in the past months due to knee pain (planning to have a knee replacement in the future).  His initial troponin is negative. Creatinine stable at 0.82. His EKG shows inferior lead ST elevation. This similar elevation was noted on previous tracings but is more prominent today.    Home Medications  Prior to Admission medications   Medication Sig Start Date End Date Taking? Authorizing Provider  aspirin EC 81 MG tablet  Take 1 tablet (81 mg total) by mouth daily. 09/13/14  Yes Fay Records, MD  desloratadine (CLARINEX) 5 MG tablet Take 5 mg by mouth daily.   Yes Historical Provider, MD  mesalamine (ASACOL) 400 MG EC tablet Take 1,600 mg by mouth 2 (two) times daily.    Yes Historical Provider, MD  metoprolol tartrate (LOPRESSOR) 25 MG tablet Take 0.5 tablets (12.5 mg total) by mouth daily. 09/15/15  Yes Fay Records, MD  Multiple Vitamins-Minerals (CENTRUM SILVER PO) Take 1 tablet by mouth daily.    Yes Historical Provider, MD  Omega-3 Fatty Acids (FISH OIL) 1200 MG CAPS Take 1 capsule by mouth 2 (two) times daily. Take 1 tablet by mouth twice a day   Yes Historical Provider, MD  simvastatin (ZOCOR) 40 MG tablet Take 1 tablet (40 mg total) by mouth daily. 09/15/15  Yes Fay Records, MD  ondansetron (ZOFRAN ODT) 8 MG disintegrating tablet Take 1 tablet (8 mg total) by mouth every 8 (eight) hours as needed for nausea or vomiting. Patient not taking: Reported on 11/18/2015 05/21/15   Jola Schmidt, MD  oxyCODONE-acetaminophen (PERCOCET/ROXICET) 5-325 MG per tablet Take 1 tablet by mouth every 4 (four) hours as needed for severe pain. Patient not taking: Reported on 11/18/2015 05/21/15   Jola Schmidt, MD    Allergies No Known Allergies  Past Medical History Past Medical History  Diagnosis Date  . Iron deficiency anemia due to chronic blood loss 09/10/2015  . Ulcerative colitis with complication ([place]) 06/22/8888  .  Coronary artery disease     a. s/p DES to RCA in 2008     Surgical History Past Surgical History  Procedure Laterality Date  . Coronary angioplasty with stent placement      a. Cordis stent to RCA in 2008 2.74m x 180m . Hip surgery       Family History Family History  Problem Relation Age of Onset  . CAD Father   . Lymphoma Mother   . Hypertension Mother   . Hypertension Brother   . Heart murmur Sister     "as a small child, resolved on its own"    Social History Social History   Social  History  . Marital Status: Married    Spouse Name: N/A  . Number of Children: N/A  . Years of Education: N/A   Occupational History  . Not on file.   Social History Main Topics  . Smoking status: Never Smoker   . Smokeless tobacco: Not on file  . Alcohol Use: 0.0 oz/week    0 Standard drinks or equivalent per week     Comment: 1-2 drinks per week.  . Drug Use: No  . Sexual Activity: Not on file   Other Topics Concern  . Not on file   Social History Narrative     Review of Systems General:  No chills, fever, night sweats or weight changes.  Cardiovascular:  No edema, orthopnea, palpitations, paroxysmal nocturnal dyspnea. Positive for chest pain and dyspnea on exertion. Dermatological: No rash, lesions/masses Respiratory: No cough, Positive for dyspnea Urologic: No hematuria, dysuria Abdominal:   No nausea, vomiting, diarrhea, bright red blood per rectum, melena, or hematemesis Neurologic:  No visual changes, wkns, changes in mental status. All other systems reviewed and are otherwise negative except as noted above.   Physical Exam: Blood pressure 102/75, pulse 90, temperature 97.8 F (36.6 C), temperature source Oral, resp. rate 18, height 5' 4"  (1.626 m), weight 185 lb (83.915 kg), SpO2 99 %.  General: Well developed, well nourished Caucasian ,male in no acute distress. Head: Normocephalic, atraumatic, sclera non-icteric, no xanthomas, nares are without discharge. Dentition:  Neck: No carotid bruits. JVD not elevated.  Lungs: Respirations regular and unlabored, without wheezes or rales.  Heart: Regular  Rhythm, tachycardiac rate. No S3 or S4.  No murmur, no rubs, or gallops appreciated. Abdomen: Soft, non-tender, non-distended with normoactive bowel sounds. No hepatomegaly. No rebound/guarding. No obvious abdominal masses. Msk:  Strength and tone appear normal for age. No joint deformities or effusions. Extremities: No clubbing or cyanosis. No edema.  Distal pedal pulses  are 2+ bilaterally. Neuro: Alert and oriented X 3. Moves all extremities spontaneously. No focal deficits noted. Psych:  Responds to questions appropriately with a normal affect. Skin: No rashes or lesions noted   Labs Lab Results  Component Value Date   WBC 12.9* 11/18/2015   HGB 13.0 11/18/2015   HCT 40.0 11/18/2015   MCV 87.0 11/18/2015   PLT 218 11/18/2015     Recent Labs Lab 11/18/15 1334  NA 139  K 4.4  CL 105  CO2 26  BUN 12  CREATININE 0.82  CALCIUM 9.6  GLUCOSE 124*   Troponin (Point of Care Test)  Recent Labs  11/18/15 1343  TROPIPOC 0.00   Lab Results  Component Value Date   CHOL 115 09/15/2015   HDL 44.90 09/15/2015   LDLCALC 51 09/15/2015   TRIG 99.0 09/15/2015   No results found for: BNP PRO B NATRIURETIC PEPTIDE (BNP)  Date/Time Value Ref Range Status  09/15/2015 09:09 AM 29.0 0.0 - 100.0 pg/mL Final    ECG: Sinus tach with rate of 107. ST elevation in inferior leads.  Radiology/Studies: Dg Chest 2 View: 11/18/2015  CLINICAL DATA:  59 year old male with history of chest pain and shortness of breath for the past 40 minutes. EXAM: CHEST  2 VIEW COMPARISON:  No priors. FINDINGS: Lung volumes are low. No consolidative airspace disease. No pleural effusions. No pneumothorax. No pulmonary nodule or mass noted. Pulmonary vasculature and the cardiomediastinal silhouette are within normal limits. IMPRESSION: 1. Low lung volumes without radiographic evidence of acute cardiopulmonary disease. Electronically Signed   By: Vinnie Langton M.D.   On: 11/18/2015 13:26    ASSESSMENT AND PLAN:   1. Chest pain concerning for acute coronary syndrome - onset while at rest. Accompanied by shortness of breath and fatigue. Relieved with SL NTG. Still having chest pain. - EKG shows minimal ST elevation in inferior leads, this was noted in previous EKG's, yet is more prominent on today's tracing. - initial troponin negative. Will cycle cardiac enzymes. - will take for  cardiac catheterization today. Reports having nothing to eat today. - continue ASA, statin, and BB. Received Heparin bolus in the ED.  2.  CAD S/P percutaneous coronary angioplasty - history of DES to RCA in 2008. - continue ASA, statin, and BB  3. Ulcerative colitis with complication (HCC) - Hgb stable at 13.0 - continue home medications.   Signed, Erma Heritage, PA-C 11/18/2015, 2:56 PM Pager: [phone]   I have seen, examined and evaluated the patient this PM along with Ms. Ahmed Prima, PA-C.  After reviewing all the available data and chart,  I agree with her findings, examination as well as impression recommendations.  Very pleasant 23 year old gentleman with history of a peer STEMI and PCI to the RCA (cath report not available - Was done in [place]) who recently stopped his Plavix after a counselor colitis flare in June. He presents here with some signs symptoms return for unstable angina/acute coronary syndrome. He continues to have chest pain despite having been given sublingual nitroglycerin. The chest discomfort and dyspnea is very similar to his MI related angina, however he does not have the radiation to the jaw and arm at this time. Pain is currently 7 out of 10 despite having received supplemental nitroglycerin. He is not taking his beta blocker today which we will go ahead and give him.  He is already on aspirin, statin and beta blocker.  Based on persistent symptoms subtle EKG changes but not consistent with STEMI, and known CAD this is clearly consistent with acute coronary syndrome until proven otherwise. We discussed the best course of action and felt like the best way to proceed was to receipt of directly to urgent cardiac catheterization to exclude ACS.  Because of his recent UC flare, he was no longer on Plavix. Would consider it DES is to be used that we would consider using Synergy stent which would allow for shorter duration of mandatory dual antiplatelet  therapy.  Further assessment plan pending results the cardiac catheterization.   Performing MD:  Sherren Mocha, M.D..  Procedure:  Left Heart Catheterization with Coronary Angiography and Possible Percutaneous Coronary Intervention  The procedure with Risks/Benefits/Alternatives and Indications was reviewed with the patient and family.  All questions were answered.    Risks / Complications include, but not limited to: Death, MI, CVA/TIA, VF/VT (with defibrillation), Bradycardia (need for temporary pacer placement), contrast induced nephropathy ,  bleeding / bruising / hematoma / pseudoaneurysm, vascular or coronary injury (with possible emergent CT or Vascular Surgery), adverse medication reactions, infection.  Additional risks involving the use of radiation with the possibility of radiation burns and cancer were explained in detail.  The patient (and family) voice understanding and agree to proceed.    Cath Lab Visit (complete for each Cath Lab visit)  Clinical Evaluation Leading to the Procedure:   ACS: Yes.    Non-ACS:    Anginal Classification: CCS IV  Anti-ischemic medical therapy: Maximal Therapy (2 or more classes of medications)  Non-Invasive Test Results: No non-invasive testing performed  Prior CABG: No previous CABG   AUC FOR PCI TIMI SCORE  Patient Information:  TIMI Score is 3  UA/NSTEMI and intermediate-risk features (e.g., TIMI score 3?4) for short-term risk of death or nonfatal MI  Revascularization of the presumed culprit artery   A (9)  Indication: 10; Score: 9    Kalon Erhardt, Leonie Green, M.D., M.S. Interventional Cardiologist   Pager # [phone]

## 2015-11-18 NOTE — ED Provider Notes (Signed)
CSN: [id number]     Arrival date & time 11/18/15  1223 History   First MD Initiated Contact with Patient 11/18/15 1345     Chief Complaint  Patient presents with  . Chest Pain  . Shortness of Breath    HPI   Tyler Estrada is a 59 y.o. male with a PMH of CAD s/p coronary angioplasty with stent placement who presents to the ED with chest pain and shortness of breath, which started approximately 1 hour prior to arrival. 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 He reports associated dizziness and nausea with the onset of his symptoms, now resolved. He denies exacerbating or alleviating factors. He states he took 3 nitroglycerin with subsequent brief period of symptom relief. He denies fever, chills, abdominal pain, vomiting, diarrhea, constipation, lower extremity edema.  Past Medical History  Diagnosis Date  . Iron deficiency anemia due to chronic blood loss 09/10/2015  . Ulcerative colitis with complication ([place]) 4/56/2563  . Coronary artery disease     a. s/p DES to RCA in 2008   Past Surgical History  Procedure Laterality Date  . Coronary angioplasty with stent placement      a. Cordis stent to RCA in 2008 2.71m x 165m . Hip surgery     Family History  Problem Relation Age of Onset  . CAD Father   . Lymphoma Mother   . Hypertension Mother   . Hypertension Brother   . Heart murmur Sister     "as a small child, resolved on its own"   Social History  Substance Use Topics  . Smoking status: Never Smoker   . Smokeless tobacco: None  . Alcohol Use: 0.0 oz/week    0 Standard drinks or equivalent per week     Comment: 1-2 drinks per week.      Review of Systems  Constitutional: Negative for fever and chills.  Respiratory: Positive for shortness of breath.   Cardiovascular: Positive for chest pain.  Gastrointestinal: Positive for nausea. Negative for vomiting, abdominal pain, diarrhea and constipation.  Neurological: Positive for  dizziness. Negative for syncope.  All other systems reviewed and are negative.     Allergies  Review of patient's allergies indicates no known allergies.  Home Medications   Prior to Admission medications   Medication Sig Start Date End Date Taking? Authorizing Provider  aspirin EC 81 MG tablet Take 1 tablet (81 mg total) by mouth daily. 09/13/14  Yes PaFay RecordsMD  desloratadine (CLARINEX) 5 MG tablet Take 5 mg by mouth daily.   Yes Historical Provider, MD  mesalamine (ASACOL) 400 MG EC tablet Take 1,600 mg by mouth 2 (two) times daily.    Yes Historical Provider, MD  metoprolol tartrate (LOPRESSOR) 25 MG tablet Take 0.5 tablets (12.5 mg total) by mouth daily. 09/15/15  Yes PaFay RecordsMD  Multiple Vitamins-Minerals (CENTRUM SILVER PO) Take 1 tablet by mouth daily.    Yes Historical Provider, MD  Omega-3 Fatty Acids (FISH OIL) 1200 MG CAPS Take 1 capsule by mouth 2 (two) times daily. Take 1 tablet by mouth twice a day   Yes Historical Provider, MD  simvastatin (ZOCOR) 40 MG tablet Take 1 tablet (40 mg total) by mouth daily. 09/15/15  Yes PaFay RecordsMD  ondansetron (ZOFRAN ODT) 8 MG disintegrating tablet Take 1 tablet (8 mg total) by mouth every 8 (eight) hours as needed for nausea or vomiting. Patient not taking:  Reported on 11/18/2015 05/21/15   Jola Schmidt, MD  oxyCODONE-acetaminophen (PERCOCET/ROXICET) 5-325 MG per tablet Take 1 tablet by mouth every 4 (four) hours as needed for severe pain. Patient not taking: Reported on 11/18/2015 05/21/15   Jola Schmidt, MD    BP 102/75 mmHg  Pulse 90  Temp(Src) 97.8 F (36.6 C) (Oral)  Resp 18  Ht 5' 4"  (1.626 m)  Wt 83.915 kg  BMI 31.74 kg/m2  SpO2 99% Physical Exam  Constitutional: He is oriented to person, place, and time. He appears well-developed and well-nourished. No distress.  HENT:  Head: Normocephalic and atraumatic.  Right Ear: External ear normal.  Left Ear: External ear normal.  Nose: Nose normal.  Mouth/Throat: Uvula  is midline, oropharynx is clear and moist and mucous membranes are normal.  Eyes: Conjunctivae, EOM and lids are normal. Pupils are equal, round, and reactive to light. Right eye exhibits no discharge. Left eye exhibits no discharge. No scleral icterus.  Neck: Normal range of motion. Neck supple.  Cardiovascular: Normal rate, regular rhythm, normal heart sounds, intact distal pulses and normal pulses.   Pulmonary/Chest: Effort normal and breath sounds normal. No respiratory distress. He has no wheezes. He has no rales.  Abdominal: Soft. Normal appearance and bowel sounds are normal. He exhibits no distension and no mass. There is no tenderness. There is no rigidity, no rebound and no guarding.  Musculoskeletal: Normal range of motion. He exhibits no edema or tenderness.  Neurological: He is alert and oriented to person, place, and time. He has normal strength. No cranial nerve deficit or sensory deficit.  Skin: Skin is warm, dry and intact. No rash noted. He is not diaphoretic. No erythema. No pallor.  Psychiatric: He has a normal mood and affect. His speech is normal and behavior is normal.  Nursing note and vitals reviewed.   ED Course  Procedures (including critical care time)  Labs Review Labs Reviewed  BASIC METABOLIC PANEL - Abnormal; Notable for the following:    Glucose, Bld 124 (*)    All other components within normal limits  CBC - Abnormal; Notable for the following:    WBC 12.9 (*)    All other components within normal limits  APTT  PROTIME-INR  I-STAT TROPOININ, ED  Randolm Idol, ED    Imaging Review Dg Chest 2 View  11/18/2015  CLINICAL DATA:  59 year old male with history of chest pain and shortness of breath for the past 40 minutes. EXAM: CHEST  2 VIEW COMPARISON:  No priors. FINDINGS: Lung volumes are low. No consolidative airspace disease. No pleural effusions. No pneumothorax. No pulmonary nodule or mass noted. Pulmonary vasculature and the cardiomediastinal  silhouette are within normal limits. IMPRESSION: 1. Low lung volumes without radiographic evidence of acute cardiopulmonary disease. Electronically Signed   By: Vinnie Langton M.D.   On: 11/18/2015 13:26   I have personally reviewed and evaluated these images and lab results as part of my medical decision-making.   EKG Interpretation   Date/Time:  Tuesday November 18 2015 13:53:51 EST Ventricular Rate:  107 PR Interval:  135 QRS Duration: 81 QT Interval:  314 QTC Calculation: 419 R Axis:   42 Text Interpretation:  Sinus tachycardia Low voltage, precordial leads  Minimal ST elevation, inferior leads Baseline wander in lead(s) V2 ST  elevation has improved but still mild diffuse ST elevation Confirmed by  Wyvonnia Dusky  MD, STEPHEN [phone]) on 11/18/2015 2:13:31 PM      MDM   Final diagnoses:  Chest pain,  unspecified chest pain type    59 year old male presents with chest pain and shortness of breath, which he states started approximately 1 hour prior to arrival. He reports associated dizziness and nausea, now resolved. Denies fever, chills, abdominal pain, vomiting, diarrhea, constipation, lower extremity edema. Per cardiology note, patient had cardiologist in [place], and had a cath in 2008 which showed 50% LAD, 90% D1, 100% RCA, LVEF 55%, and underwent PCI/stent. Patient follows with Dr. Harrington Challenger (last seen September 2016).  Patient is afebrile. Vital signs stable. Heart regular rate and rhythm. Lungs clear to auscultation bilaterally. Abdomen soft, nontender, nondistended. Distal pulses symmetric bilaterally. Strength and sensation intact. No lower extremity edema.  EKG remarkable for sinus tachycardia, minimal ST elevation in inferior leads. Initial troponin negative. Given ASA, heparin. Dr. Wyvonnia Dusky spoke with Dr. Irish Lack regarding EKG findings.  Cardiology consulted, spoke with Wannetta Sender, cardiology to see the patient.  CBC remarkable for leukocytosis of 12.9, hemoglobin 13. BMP  unremarkable. Chest x-ray with low lung volumes without radiographic evidence of acute cardiopulmonary disease.  Cardiology (Dr. Ellyn Hack) saw patient in the ED and advised they will take patient to the cath lab.  BP 102/75 mmHg  Pulse 90  Temp(Src) 97.8 F (36.6 C) (Oral)  Resp 18  Ht 5' 4"  (1.626 m)  Wt 83.915 kg  BMI 31.74 kg/m2  SpO2 99%   Marella Chimes, PA-C 11/18/15 [place], MD 11/18/15 [phone]

## 2015-11-18 NOTE — ED Notes (Signed)
Pt sts generalized CP and SOB starting today; pt sts took 3 SL nitro without relief; pt sts pain worse with inspiration and cough

## 2015-11-18 NOTE — ED Notes (Signed)
Unable to obtain IV access.

## 2015-11-19 ENCOUNTER — Inpatient Hospital Stay (HOSPITAL_COMMUNITY): Payer: 59

## 2015-11-19 ENCOUNTER — Encounter (HOSPITAL_COMMUNITY): Payer: Self-pay | Admitting: Cardiovascular Disease

## 2015-11-19 ENCOUNTER — Telehealth: Payer: Self-pay | Admitting: Physician Assistant

## 2015-11-19 DIAGNOSIS — I5189 Other ill-defined heart diseases: Secondary | ICD-10-CM

## 2015-11-19 DIAGNOSIS — R778 Other specified abnormalities of plasma proteins: Secondary | ICD-10-CM

## 2015-11-19 DIAGNOSIS — I2510 Atherosclerotic heart disease of native coronary artery without angina pectoris: Secondary | ICD-10-CM | POA: Diagnosis not present

## 2015-11-19 DIAGNOSIS — R7989 Other specified abnormal findings of blood chemistry: Secondary | ICD-10-CM

## 2015-11-19 DIAGNOSIS — I309 Acute pericarditis, unspecified: Secondary | ICD-10-CM

## 2015-11-19 DIAGNOSIS — E785 Hyperlipidemia, unspecified: Secondary | ICD-10-CM | POA: Diagnosis present

## 2015-11-19 DIAGNOSIS — R0602 Shortness of breath: Secondary | ICD-10-CM | POA: Diagnosis not present

## 2015-11-19 DIAGNOSIS — R079 Chest pain, unspecified: Secondary | ICD-10-CM

## 2015-11-19 DIAGNOSIS — I2584 Coronary atherosclerosis due to calcified coronary lesion: Secondary | ICD-10-CM | POA: Diagnosis not present

## 2015-11-19 HISTORY — DX: Other ill-defined heart diseases: I51.89

## 2015-11-19 LAB — BASIC METABOLIC PANEL
Anion gap: 7 (ref 5–15)
BUN: 8 mg/dL (ref 6–20)
CO2: 26 mmol/L (ref 22–32)
Calcium: 9.1 mg/dL (ref 8.9–10.3)
Chloride: 106 mmol/L (ref 101–111)
Creatinine, Ser: 0.77 mg/dL (ref 0.61–1.24)
GFR calc Af Amer: >60 mL/min (ref >60)
GLUCOSE: 108 mg/dL — AB (ref 65–99)
POTASSIUM: 3.7 mmol/L (ref 3.5–5.1)
Sodium: 139 mmol/L (ref 135–145)

## 2015-11-19 LAB — TROPONIN I
TROPONIN I: 0.1 ng/mL — AB (ref <0.031)
TROPONIN I: 0.06 ng/mL — AB (ref <0.031)

## 2015-11-19 MED ORDER — COLCHICINE 0.6 MG PO TABS
0.6000 mg | ORAL_TABLET | Freq: Two times a day (BID) | ORAL | Status: DC
  Administered 2015-11-19: 0.6 mg via ORAL
  Filled 2015-11-19: qty 1

## 2015-11-19 MED ORDER — COLCHICINE 0.6 MG PO TABS: 0.6000 mg | ORAL_TABLET | Freq: Two times a day (BID) | ORAL | Status: DC

## 2015-11-19 MED ORDER — NITROGLYCERIN 0.4 MG SL SUBL: 0.4000 mg | SUBLINGUAL_TABLET | SUBLINGUAL | Status: DC | PRN

## 2015-11-19 NOTE — Telephone Encounter (Signed)
New message      TCM appt made on 11-25-12 with Tyler Estrada

## 2015-11-19 NOTE — Discharge Summary (Signed)
Discharge Summary   Patient ID: Tyler Estrada,  MRN: [id number], DOB/AGE: 59-08-1956 59 y.o.  Admit date: 11/18/2015 Discharge date: 11/19/2015  Primary Care Provider: Eloise Levels Primary Cardiologist: Lizbeth Bark, MD   Discharge Diagnoses    Principal Problem:   Acute pericarditis  **S/P catheterization this admission revealing non-obstructive dzs.  **CTA chest this admission negative for PE.  **Colchicine initiated.  Active Problems:   CAD S/P percutaneous coronary angioplasty   Acute chest pain   Elevated troponin   Ulcerative colitis with complication (HCC)   Hyperlipidemia   Allergies No Known Allergies  Diagnostic Studies/Procedures    Cardiac Catheterization 11.29.2016  Coronary Findings     Dominance: Right    Left Main  . Vessel is angiographically normal. The left main is calcified without obstructive disease      Left Anterior Descending   . Ost LAD lesion, 50% stenosed. There is 50% stenosis of the proximal LAD best appreciated in the RAO caudal view      Left Circumflex   . First Obtuse Marginal Branch   The vessel exhibits minimal luminal irregularities.   . Second Obtuse Marginal Branch   The vessel exhibits minimal luminal irregularities.      Right Coronary Artery   . Prox RCA to Mid RCA lesion, 25% stenosed. The lesion was previously treated with a stent (unknown type). Mild diffuse in-stent restenosis   . Mid RCA to Dist RCA lesion, 30% stenosed. The lesion was previously treated with a stent (unknown type). Mild diffuse in-stent restenosis     Left Ventricle The left ventricular size is normal. There is hyperdynamic left ventricular systolic function. The left ventricular ejection fraction is greater tha 65% by visual estimate. There are no wall motion abnormalities in the left ventricle. _____________  CT Angio of the Chest with Contrast 11.29.2016  IMPRESSION: No evidence of acute pulmonary thromboembolism. _____________   2D  Echocardiogram 11.30.2016   Study Conclusions  - Left ventricle: The cavity size was normal. Wall thickness was   normal. Systolic function was normal. The estimated ejection   fraction was in the range of 60% to 65%. Wall motion was normal;   there were no regional wall motion abnormalities. Doppler   parameters are consistent with abnormal left ventricular   relaxation (grade 1 diastolic dysfunction). _____________   History of Present Illness  59 y/o male with a h/o CAD s/p DES to the RCA in 2008.  He also has a h/o ulcerative colitis on chronic mesalamine therapy.  He was in his usual state of health until 11/18/2015, when he developed sudden upper chest pressure associated with dyspnea and fatigue.  He took sublingual NTG without much relief, though subsequent NTG helped to reduce the severity of his discomfort.  He presented to the [facility] ED where he continued to have c/p, especially with lying down and deep breathing.  ECG was notable for inferior ST elevation, which was similar, though more prominent than what was noted on previous tracings.  Initial troponin was mildly elevated at 0.06.  He was seen by cardiology and subsequently admitted for further evaluation and management of presumed ACS.  Hospital Course   Consultants: None   Given ongoing chest pain and troponin elevation, Tyler Estrada underwent diagnostic catheterization on 11/29 revealing non-obstructive CAD with minimal in-stent restenosis within the RCA.  Medical therapy was recommended.  CT angio of the chest with contrast was then performed and was negative for pulmonary embolus.  Tyler Estrada continued  to have pleuritic chest discomfort and it is felt that this most likely represent pericarditis.  Neither CT nor Echo has shown any significant pericardial effusion and he has been hemodynamically stable.  We have started colchicine 0.6 mg BID and he has had symptomatic improvement.  Given h/o ulcerative colitis, we have opted to  forego usage of NSAIDS.  He should remain on colchicine for 3 months.    Tyler Estrada has been ambulating without limitations this afternoon.  He will be discharged home today in good condition and we have arranged for early follow-up next week. _____________  Discharge Vitals Blood pressure 119/106, pulse 86, temperature 98.3 F (36.8 C), temperature source Oral, resp. rate 12, height 5' 4"  (1.626 m), weight 185 lb (83.915 kg), SpO2 96 %.  Filed Weights   11/18/15 1400 11/18/15 1414  Weight: 185 lb (83.915 kg) 185 lb (83.915 kg)    Labs     CBC  Recent Labs  11/18/15 1334  WBC 12.9*  HGB 13.0  HCT 40.0  MCV 87.0  PLT 101   Basic Metabolic Panel  Recent Labs  11/18/15 1334 11/19/15 0653  NA 139 139  K 4.4 3.7  CL 105 106  CO2 26 26  GLUCOSE 124* 108*  BUN 12 8  CREATININE 0.82 0.77  CALCIUM 9.6 9.1   Cardiac Enzymes  Recent Labs  11/18/15 2036 11/19/15 0130 11/19/15 0653  TROPONINI 0.07* 0.10* 0.06*   Disposition   Pt is being discharged home today in good condition.  Follow-up Plans & Appointments    Follow-up Information    Follow up with [name], PA-C On 11/26/2015.   Specialties:  Cardiology, Radiology   Why:  9:00 AM - Dr. Harrington Challenger' PA   Contact information:   [address] [place] [address] [phone]       Follow up with Eloise Levels, NP.   Specialty:  Nurse Practitioner   Why:  as scheduled.   Contact information:   [address] [place] [address] [phone]       Discharge Medications   Current Discharge Medication List    START taking these medications   Details  colchicine 0.6 MG tablet Take 1 tablet (0.6 mg total) by mouth 2 (two) times daily. Qty: 60 tablet, Refills: 2    nitroGLYCERIN (NITROSTAT) 0.4 MG SL tablet Place 1 tablet (0.4 mg total) under the tongue every 5 (five) minutes x 3 doses as needed for chest pain. Qty: 25 tablet, Refills: 3      CONTINUE these medications  which have NOT CHANGED   Details  aspirin EC 81 MG tablet Take 1 tablet (81 mg total) by mouth daily. Qty: 90 tablet, Refills: 3    desloratadine (CLARINEX) 5 MG tablet Take 5 mg by mouth daily.    mesalamine (ASACOL) 400 MG EC tablet Take 1,600 mg by mouth 2 (two) times daily.     metoprolol tartrate (LOPRESSOR) 25 MG tablet Take 0.5 tablets (12.5 mg total) by mouth daily. Qty: 45 tablet, Refills: 3    Multiple Vitamins-Minerals (CENTRUM SILVER PO) Take 1 tablet by mouth daily.     Omega-3 Fatty Acids (FISH OIL) 1200 MG CAPS Take 1 capsule by mouth 2 (two) times daily. Take 1 tablet by mouth twice a day    simvastatin (ZOCOR) 40 MG tablet Take 1 tablet (40 mg total) by mouth daily. Qty: 30 tablet, Refills: 11         Aspirin prescribed  at discharge:  Yes High Intensity Statin Prescribed? (Lipitor 40-77m or Crestor 20-490m: No: prior home dose of statin continued as presentation deemed to be 2/2 pericarditis. Beta Blocker Prescribed: Yes ADP Receptor Inhibitor Prescribed? (i.e. Plavix etc.-Includes Medically Managed Patients): No: not indicated in setting of pericarditis. For EF <40%, Aldosterone Inhibitor Prescribed? No: N/A. Was EF assessed during THIS hospitalization? Yes Was Cardiac Rehab II ordered? (Included Medically managed Patients): Yes   Outstanding Labs/Studies   None  Duration of Discharge Encounter   Greater than 30 minutes including physician time.  Signed, ChMurray HodgkinsP 11/19/2015, 3:57 PM   I have seen, examined and evaluated the patient this PM along with Mr. BeCyndie MullAfter reviewing all the available data and chart, I agree with his summary.  Patient actually looks much more stable today. No further significant chest pain, but does have some residual discomfort. Thankfully his cardiac catheterization and CT angiogram both were unrevealing. He just had an echo done that looked relatively normal as well. No signs of major pericardial  effusion. I did not hear a rub on exam, however with moderate troponin elevation and what sounds a potentially positional chest discomfort would say that his diagnosis of exclusion is pericarditis. I agree with using colchicine for treatment. Reluctant to use NSAIDs given his ulcerative colitis. Would also prefer not to use steroids unless we need to.  I think he is stable for d/c today, provided the Echo is normal.   HAEllyn HackDALeonie GreenM.D., M.S. Interventional Cardiologist   Pager # [phone]

## 2015-11-19 NOTE — Discharge Instructions (Signed)
**  PLEASE REMEMBER TO BRING ALL OF YOUR MEDICATIONS TO EACH OF YOUR FOLLOW-UP OFFICE VISITS.  Radial Site Care Refer to this sheet in the next few weeks. These instructions provide you with information on caring for yourself after your procedure. Your caregiver may also give you more specific instructions. Your treatment has been planned according to current medical practices, but problems sometimes occur. Call your caregiver if you have any problems or questions after your procedure. HOME CARE INSTRUCTIONS  You may shower the day after the procedure.Remove the bandage (dressing) and gently wash the site with plain soap and water.Gently pat the site dry.   Do not apply powder or lotion to the site.   Do not submerge the affected site in water for 3 to 5 days.   Inspect the site at least twice daily.   Do not flex or bend the affected arm for 24 hours.   No lifting over 5 pounds (2.3 kg) for 5 days after your procedure.   Do not drive home if you are discharged the same day of the procedure. Have someone else drive you.   You may drive 24 hours after the procedure unless otherwise instructed by your caregiver.  What to expect:  Any bruising will usually fade within 1 to 2 weeks.   Blood that collects in the tissue (hematoma) may be painful to the touch. It should usually decrease in size and tenderness within 1 to 2 weeks.  SEEK IMMEDIATE MEDICAL CARE IF:  You have unusual pain at the radial site.   You have redness, warmth, swelling, or pain at the radial site.   You have drainage (other than a small amount of blood on the dressing).   You have chills.   You have a fever or persistent symptoms for more than 72 hours.   You have a fever and your symptoms suddenly get worse.   Your arm becomes pale, cool, tingly, or numb.   You have heavy bleeding from the site. Hold pressure on the site.

## 2015-11-19 NOTE — Progress Notes (Signed)
D/C instructions given. Patient verbalizes understanding. PIV d/c'd. Patient with no complaints at the current time. Will d/c via WC to car.

## 2015-11-19 NOTE — Progress Notes (Signed)
Echocardiogram 2D Echocardiogram has been performed.  Tyler Estrada 11/19/2015, 12:30 PM

## 2015-11-19 NOTE — Progress Notes (Signed)
Patient Name: Tyler Estrada Date of Encounter: 11/19/2015   Principal Problem:   Acute pericarditis Active Problems:   CAD S/P percutaneous coronary angioplasty   Acute chest pain   Elevated troponin   Ulcerative colitis with complication (HCC)   Hyperlipidemia   SUBJECTIVE  Chest pain improved overall.  He does have mild discomfort when lying flat or taking a deep breath however.  Echo just done.  Eager to go home.  CURRENT MEDS . aspirin EC  81 mg Oral Daily  . loratadine  10 mg Oral Daily  . Mesalamine  1,600 mg Oral BID  . metoprolol tartrate  12.5 mg Oral Daily  . metoprolol tartrate  25 mg Oral Once  . pneumococcal 23 valent vaccine  0.5 mL Intramuscular Tomorrow-1000  . simvastatin  40 mg Oral Daily  . sodium chloride  3 mL Intravenous Q12H    OBJECTIVE  Filed Vitals:   11/18/15 2330 11/19/15 0000 11/19/15 0400 11/19/15 0800  BP: 106/60 111/65 111/76 113/73  Pulse: 111 104 93 87  Temp:  99.2 F (37.3 C) 99 F (37.2 C) 97.9 F (36.6 C)  TempSrc:  Oral Oral Oral  Resp: 11 15 14 9   Height:      Weight:      SpO2: 95% 94% 94% 96%    Intake/Output Summary (Last 24 hours) at 11/19/15 1141 Last data filed at 11/19/15 0900  Gross per 24 hour  Intake 958.33 ml  Output   1301 ml  Net -342.67 ml   Filed Weights   11/18/15 1400 11/18/15 1414  Weight: 185 lb (83.915 kg) 185 lb (83.915 kg)    PHYSICAL EXAM  General: Pleasant, NAD. Neuro: Alert and oriented X 3. Moves all extremities spontaneously. Psych: Normal affect. HEENT:  Normal  Neck: Supple without bruits or JVD. Lungs:  Resp regular and unlabored, CTA. Heart: RRR no s3, s4, or murmurs.  No rub. Abdomen: Soft, non-tender, non-distended, BS + x 4.  Extremities: No clubbing, cyanosis or edema. DP/PT/Radials 2+ and equal bilaterally. R wrist cath site w/o bleeding/bruit/hematoma.  Accessory Clinical Findings  CBC  Recent Labs  11/18/15 1334  WBC 12.9*  HGB 13.0  HCT 40.0  MCV 87.0  PLT  321   Basic Metabolic Panel  Recent Labs  11/18/15 1334 11/19/15 0653  NA 139 139  K 4.4 3.7  CL 105 106  CO2 26 26  GLUCOSE 124* 108*  BUN 12 8  CREATININE 0.82 0.77  CALCIUM 9.6 9.1   Cardiac Enzymes  Recent Labs  11/18/15 2036 11/19/15 0130 11/19/15 0653  TROPONINI 0.07* 0.10* 0.06*   TELE  rsr sinus tach.  ECG  RSR, 94, no acute st/t changes.  Radiology/Studies  Dg Chest 2 View  11/18/2015  CLINICAL DATA:  59 year old male with history of chest pain and shortness of breath for the past 40 minutes. EXAM: CHEST  2 VIEW COMPARISON:  No priors. FINDINGS: Lung volumes are low. No consolidative airspace disease. No pleural effusions. No pneumothorax. No pulmonary nodule or mass noted. Pulmonary vasculature and the cardiomediastinal silhouette are within normal limits. IMPRESSION: 1. Low lung volumes without radiographic evidence of acute cardiopulmonary disease. Electronically Signed   By: Vinnie Langton M.D.   On: 11/18/2015 13:26   Ct Angio Chest Pe W/cm &/or Wo Cm  11/18/2015  CLINICAL DATA:  Chest pain and short of breath today. EXAM: CT ANGIOGRAPHY CHEST WITH CONTRAST TECHNIQUE: Multidetector CT imaging of the chest was performed using the standard protocol during  bolus administration of intravenous contrast. Multiplanar CT image reconstructions and MIPs were obtained to evaluate the vascular anatomy. CONTRAST:  146m OMNIPAQUE IOHEXOL 350 MG/ML SOLN COMPARISON:  None. FINDINGS: There are no filling defects in the pulmonary arterial tree to suggest acute pulmonary thromboembolism. Negative for abnormal adenopathy. Three vessel coronary artery calcification. No pneumothorax.  No pleural effusion Mild dependent atelectasis bilaterally. No acute bony deformity. Review of the MIP images confirms the above findings. IMPRESSION: No evidence of acute pulmonary thromboembolism. Electronically Signed   By: AMarybelle KillingsM.D.   On: 11/18/2015 19:29   ASSESSMENT AND PLAN  1.   Acute chest pain/Elevated troponin/Acute pericarditis:  Cath yesterday revealed non-obstructive CAD with minimal ISR w/in RCA stents.  No targets for intervention.  He continues to have pleuritic and somewhat positional chest pain, most consistent with pericarditis.  No ST elev on ECG.  No effusion noted on CTA yesterday.  Echo just done - read pending.  Will add colchicine 0.650mbid.  Avoiding NSAIDS in setting of Ulcerative Colitis.  Cont med Rx for CAD ASA, bb, statin.  Ambulate.  Possible d/c later today if pain stable and echo ok.  2.  Hyperlipidemia:  LDL 51 in Sept. Cont statin.  3.  Ulcerative Colitis:  Stable on mesalamine.  Signed, ChMurray HodgkinsP   I have seen, examined and evaluated the patient this PM along with Mr. BeCyndie Mull After reviewing all the available data and chart,  I agree with his findings, examination as well as impression recommendations.  Patient actually looks much more stable today. No further significant chest pain, but does have some residual discomfort. Thankfully his cardiac catheterization and CT angiogram both were unrevealing. He just had an echo done that looked relatively normal as well. No signs of major pericardial effusion. I did not hear a rub on exam, however with moderate troponin elevation and what sounds a potentially positional chest discomfort would say that his diagnosis of exclusion is pericarditis. I agree with using colchicine for treatment. Reluctant to use NSAIDs given his ulcerative colitis. Would also prefer not to use steroids unless we need to.  I thank you be stable today, we can likely discharge him home with close follow-up to reassess.   HALeonie ManM.D., M.S. Interventional Cardiologist   Pager # [phone]

## 2015-11-20 NOTE — Telephone Encounter (Signed)
Left message on machine for pt to contact the office.   

## 2015-11-21 NOTE — Telephone Encounter (Signed)
Patient contacted regarding discharge from [facility] on November 19, 2015.  Patient understands to follow up with provider Bonney Leitz, PA-C on November 26, 2015 at [facility]. Patient understands discharge instructions? yes Patient understands medications and regiment? yes Patient understands to bring all medications to this visit? yes

## 2015-11-25 NOTE — Progress Notes (Signed)
Cardiology Office Note   Date:  11/26/2015   ID:  Tyler Estrada, DOB 1955/12/24, MRN [id number]  PCP:  Eloise Levels, NP  Cardiologist:  Dr. Annamarie Major hospital follow up.    History of Present Illness: Tyler Estrada is a 59 y.o. male with a h/o CAD s/p DES to the RCA in 2008, HLD, and ulcerative colitis on chronic mesalamine therapy who presents to clinic for post hospital follow up.    He was in his usual state of health until 11/18/2015, when he developed sudden upper chest pressure associated with dyspnea and fatigue. He took sublingual NTG without much relief, though subsequent NTG helped to reduce the severity of his discomfort. He presented to the [facility] ED where he continued to have c/p, especially with lying down and deep breathing. ECG was notable for inferior ST elevation, which was similar, though more prominent than what was noted on previous tracings. Initial troponin was mildly elevated at 0.06. He was seen by cardiology and subsequently admitted for further evaluation and management of presumed ACS.  Given ongoing chest pain and troponin elevation, Tyler Estrada underwent diagnostic catheterization on 11/18/15 revealing non-obstructive CAD with minimal in-stent restenosis within the RCA. Medical therapy was recommended. CT angio of the chest with contrast was then performed and was negative for pulmonary embolus. Tyler Estrada continued to have pleuritic chest dbniscomfort and it is felt that this most likely represent pericarditis. Neither CT nor Echo has shown any significant pericardial effusion and he has been hemodynamically stable. He was started on colchicine 0.6 mg BID and he has had symptomatic improvement. Given h/o ulcerative colitis, we have opted to forego usage of NSAIDS. He should remain on colchicine for 3 months.   Today he presents to clinic for follow up.  No LE edema, orhopnea, PND. No dizziness or passing out. He still has some chest tightness that  comes and goes. It is much better than when he was admitted. It is worse lying flat and better leaning forward. He also has some exertional SOB. He feels fatigued more than anything      Past Medical History  Diagnosis Date  . Iron deficiency anemia due to chronic blood loss 09/10/2015  . Ulcerative colitis with complication ([place]) 7/61/6073  . Coronary artery disease     a. s/p DES to RCA in 2008;  b. 10/2015 Cath: LM nl, LAD 50ost/p, OM1/2 min irregs, RCA 46mISR, 38m ISR, EF 65%.  . Hyperlipidemia   . Pericarditis     a. 10/2015->colchicine.    Past Surgical History  Procedure Laterality Date  . Coronary angioplasty with stent placement      a. Cordis stent to RCA in 2008 2.7528m 45m16m Hip surgery    . Cardiac catheterization N/A 11/18/2015    Procedure: Left Heart Cath and Coronary Angiography;  Surgeon: MichSherren Mocha;  [facility];  Service: Cardiovascular;  Laterality: N/A;     Current Outpatient Prescriptions  Medication Sig Dispense Refill  . aspirin EC 81 MG tablet Take 1 tablet (81 mg total) by mouth daily. 90 tablet 3  . colchicine 0.6 MG tablet Take 1 tablet (0.6 mg total) by mouth 2 (two) times daily. 60 tablet 2  . desloratadine (CLARINEX) 5 MG tablet Take 5 mg by mouth daily.    . mesalamine (ASACOL) 400 MG EC tablet Take 1,600 mg by mouth 2 (two) times daily.     . metoprolol tartrate (LOPRESSOR) 25  MG tablet Take 0.5 tablets (12.5 mg total) by mouth daily. 45 tablet 3  . Multiple Vitamins-Minerals (CENTRUM SILVER PO) Take 1 tablet by mouth daily.     . nitroGLYCERIN (NITROSTAT) 0.4 MG SL tablet Place 1 tablet (0.4 mg total) under the tongue every 5 (five) minutes x 3 doses as needed for chest pain. 25 tablet 3  . Omega-3 Fatty Acids (FISH OIL) 1200 MG CAPS Take 1 capsule by mouth 2 (two) times daily. Take 1 tablet by mouth twice a day    . simvastatin (ZOCOR) 40 MG tablet Take 1 tablet (40 mg total) by mouth daily. 30 tablet 11  .  traMADol (ULTRAM) 50 MG tablet TAKE 1 TABLET BY MOUTH EVERY 8-12 HOURS AS NEEDED for pain  1   No current facility-administered medications for this visit.    Allergies:   Review of patient's allergies indicates no known allergies.    Social History:  The patient  reports that he has never smoked. He does not have any smokeless tobacco history on file. He reports that he drinks alcohol. He reports that he does not use illicit drugs.   Family History:  The patient's family history includes CAD in his father; Heart murmur in his sister; Hypertension in his brother and mother; Lymphoma in his mother.    ROS:  Please see the history of present illness.   Otherwise, review of systems are positive for NONE.   All other systems are reviewed and negative.    PHYSICAL EXAM: VS:  BP 126/84 mmHg  Pulse 79  Ht 5' 4"  (1.626 m)  Wt 187 lb 3.2 oz (84.913 kg)  BMI 32.12 kg/m2 , BMI Body mass index is 32.12 kg/(m^2). GEN: Well nourished, well developed, in no acute distress HEENT: normal Neck: no JVD, carotid bruits, or masses Cardiac: RRR; no murmurs, rubs, or gallops,no edema  Respiratory:  clear to auscultation bilaterally, normal work of breathing GI: soft, nontender, nondistended, + BS MS: no deformity or atrophy Skin: warm and dry, no rash Neuro:  Strength and sensation are intact Psych: euthymic mood, full affect   EKG:  EKG is ordered today. The ekg ordered today demonstrates HR 79 NSR   Recent Labs: 05/21/2015: ALT 16* 09/15/2015: Pro B Natriuretic peptide (BNP) 29.0 11/18/2015: Hemoglobin 13.0; Platelets 218 11/19/2015: BUN 8; Creatinine, Ser 0.77; Potassium 3.7; Sodium 139    Lipid Panel    Component Value Date/Time   CHOL 115 09/15/2015 0909   TRIG 99.0 09/15/2015 0909   HDL 44.90 09/15/2015 0909   CHOLHDL 3 09/15/2015 0909   VLDL 19.8 09/15/2015 0909   LDLCALC 51 09/15/2015 0909      Wt Readings from Last 3 Encounters:  11/26/15 187 lb 3.2 oz (84.913 kg)  11/18/15  185 lb (83.915 kg)  09/15/15 183 lb (83.008 kg)      Other studies Reviewed: Additional studies/ records that were reviewed today include: 2D ECHO, LHC, CTA Review of the above records demonstrates:   [facility] 11/18/15  Conclusion    1. Patent stents in the RCA with mild in-stent restenosis 2. Moderate proximal LAD stenosis, do not suspect 'flow-limiting' lesion 3. Widely patent and large left circumflex without stenosis 4. Vigorous LV systolic function with normal LVEDP  Recommend: suspect noncardiac chest pain. Pt with 7/10 chest pain on cath table with TIMI-3 flow and no high-grade coronary obstruction. Will refer urgently for CTA - PE study to evaluate for pulmonary embolus.    CT Angio of the Chest with  Contrast 11.29.2016  IMPRESSION: No evidence of acute pulmonary thromboembolism. _____________  2D Echocardiogram 11.30.2016 Study Conclusions - Left ventricle: The cavity size was normal. Wall thickness was normal. Systolic function was normal. The estimated ejection fraction was in the range of 60% to 65%. Wall motion was normal; there were no regional wall motion abnormalities. Doppler parameters are consistent with abnormal left ventricular relaxation (grade 1 diastolic dysfunction).   ASSESSMENT AND PLAN:  Tyler Estrada is a 59 y.o. male with a h/o CAD s/p DES to the RCA in 2008, HLD, and ulcerative colitis on chronic mesalamine therapy who presents to clinic for post hospital follow up.   CAD s/p DES to RCA ( 2008) -- LHC 11/18/15 revealing non-obstructive CAD with minimal in-stent restenosis within the RCA. Medical therapy was recommended.  -- Continue ASA, Lopressor 12.75m BID and simvastatin 464m   Chest pain 2/2 acute pericarditis -- Continue colchicine 0.6 mg BID and he has had symptomatic improvement. Given h/o ulcerative colitis, we have opted to forego usage of NSAIDS. He should remain on colchicine for 3 months.   HLD- continue  statin  UC- cont chronic mesalamine therapy   Current medicines are reviewed at length with the patient today.  The patient does not have concerns regarding medicines.  The following changes have been made:  no change  Labs/ tests ordered today include:   Orders Placed This Encounter  Procedures  . EKG 12-Lead     Disposition:   FU with Dr RoHarrington Challengern 3 months   Signed, THCrista Luria12/06/2015 9:14 AM    [id number]  [id number]: [phone]: [phone]

## 2015-11-26 ENCOUNTER — Encounter: Payer: Self-pay | Admitting: Physician Assistant

## 2015-11-26 ENCOUNTER — Ambulatory Visit (INDEPENDENT_AMBULATORY_CARE_PROVIDER_SITE_OTHER): Payer: 59 | Admitting: Physician Assistant

## 2015-11-26 VITALS — BP 126/84 | HR 79 | Ht 64.0 in | Wt 187.2 lb

## 2015-11-26 DIAGNOSIS — R079 Chest pain, unspecified: Secondary | ICD-10-CM

## 2015-11-26 DIAGNOSIS — Z9861 Coronary angioplasty status: Secondary | ICD-10-CM

## 2015-11-26 DIAGNOSIS — I2510 Atherosclerotic heart disease of native coronary artery without angina pectoris: Secondary | ICD-10-CM

## 2015-11-26 DIAGNOSIS — I309 Acute pericarditis, unspecified: Secondary | ICD-10-CM | POA: Diagnosis not present

## 2015-11-26 DIAGNOSIS — E785 Hyperlipidemia, unspecified: Secondary | ICD-10-CM

## 2015-11-26 MED ORDER — METOPROLOL TARTRATE 25 MG PO TABS: 12.5000 mg | ORAL_TABLET | Freq: Every day | ORAL | Status: DC

## 2015-11-26 MED ORDER — SIMVASTATIN 40 MG PO TABS: 40.0000 mg | ORAL_TABLET | Freq: Every day | ORAL | Status: DC

## 2015-11-26 NOTE — Patient Instructions (Addendum)
Medication Instructions:  Your physician recommends that you continue on your current medications as directed. Please refer to the Current Medication list given to you today.   Labwork: None ordered  Testing/Procedures: None ordered  Follow-Up: Your physician wants you to follow-up in:  [place].    Any Other Special Instructions Will Be Listed Below (If Applicable).

## 2016-02-26 ENCOUNTER — Ambulatory Visit: Payer: 59 | Admitting: Internal Medicine

## 2016-03-22 ENCOUNTER — Encounter: Payer: Self-pay | Admitting: Internal Medicine

## 2016-03-22 ENCOUNTER — Ambulatory Visit (INDEPENDENT_AMBULATORY_CARE_PROVIDER_SITE_OTHER): Payer: 59 | Admitting: Internal Medicine

## 2016-03-22 VITALS — BP 126/60 | HR 88 | Ht 64.0 in | Wt 190.2 lb

## 2016-03-22 DIAGNOSIS — R079 Chest pain, unspecified: Secondary | ICD-10-CM | POA: Diagnosis not present

## 2016-03-22 DIAGNOSIS — E785 Hyperlipidemia, unspecified: Secondary | ICD-10-CM | POA: Diagnosis not present

## 2016-03-22 NOTE — Patient Instructions (Signed)
Your physician recommends that you continue on your current medications as directed. Please refer to the Current Medication list given to you today. Your physician wants you to follow-up in: 9 months with Dr. Harrington Challenger.  You will receive a reminder letter in the mail two months in advance. If you don't receive a letter, please call our office to schedule the follow-up appointment.

## 2016-03-22 NOTE — Progress Notes (Signed)
Cardiology Office Note   Date:  03/22/2016   ID:  Tyler Estrada, DOB 01-15-1956, MRN [id number]  PCP:  Eloise Levels, NP  Cardiologist:   Dorris Carnes, MD   F/U of CAD    History of Present Illness: Tyler Estrada is a 60 y.o. male with a history of CAD  A cath in 2008Showed 50% LAD 90% D1; 100% RCA LVEF 55% Patient underwent PCI/stent Has had nuclear stress test since. Normal Remained on chronic ASA and Plavix.  I saw him in Sept 2016 He wa hosop in November  Elev inferiory on EKG  Cath on 11/18/15 showed nonobstructie CAD with minimal instent restenosis to the RCA  CT angio of chest neg for PE   CP felt to possibly be pericarditis  No effusion  Rx empirically with colchicine.   Seen in Dec by Leonides Sake also has a history of UC     SInce seen he has done well  SYmptoms have gone away  NO CP  No SOB  Does feel fatigued Attrib to pain meds for knee (Followed by Dr Alvan Dame who recomm partial L knee replacement)    Outpatient Prescriptions Prior to Visit  Medication Sig Dispense Refill  . aspirin EC 81 MG tablet Take 1 tablet (81 mg total) by mouth daily. 90 tablet 3  . desloratadine (CLARINEX) 5 MG tablet Take 5 mg by mouth daily.    . mesalamine (ASACOL) 400 MG EC tablet Take 1,600 mg by mouth 2 (two) times daily.     . metoprolol tartrate (LOPRESSOR) 25 MG tablet Take 0.5 tablets (12.5 mg total) by mouth daily. 15 tablet 11  . Multiple Vitamins-Minerals (CENTRUM SILVER PO) Take 1 tablet by mouth daily.     . nitroGLYCERIN (NITROSTAT) 0.4 MG SL tablet Place 1 tablet (0.4 mg total) under the tongue every 5 (five) minutes x 3 doses as needed for chest pain. 25 tablet 3  . Omega-3 Fatty Acids (FISH OIL) 1200 MG CAPS Take 1 capsule by mouth 2 (two) times daily. Take 1 tablet by mouth twice a day    . simvastatin (ZOCOR) 40 MG tablet Take 1 tablet (40 mg total) by mouth daily. 30 tablet 11  . traMADol (ULTRAM) 50 MG tablet TAKE 1 TABLET BY MOUTH EVERY 8-12 HOURS AS NEEDED  for pain  1  . colchicine 0.6 MG tablet Take 1 tablet (0.6 mg total) by mouth 2 (two) times daily. (Patient not taking: Reported on 03/22/2016) 60 tablet 2   No facility-administered medications prior to visit.     Allergies:   Review of patient's allergies indicates no known allergies.   Past Medical History  Diagnosis Date  . Iron deficiency anemia due to chronic blood loss 09/10/2015  . Ulcerative colitis with complication ([place]) 4/58/0998  . Coronary artery disease     a. s/p DES to RCA in 2008;  b. 10/2015 Cath: LM nl, LAD 50ost/p, OM1/2 min irregs, RCA 85mISR, 364m ISR, EF 65%.  . Hyperlipidemia   . Pericarditis     a. 10/2015->colchicine.    Past Surgical History  Procedure Laterality Date  . Coronary angioplasty with stent placement      a. Cordis stent to RCA in 2008 2.7598m 54m3m Hip surgery    . Cardiac catheterization N/A 11/18/2015    Procedure: Left Heart Cath and Coronary Angiography;  Surgeon: MichSherren Mocha;  [facility];  Service: Cardiovascular;  Laterality: N/A;  Social History:  The patient  reports that he has never smoked. He does not have any smokeless tobacco history on file. He reports that he drinks alcohol. He reports that he does not use illicit drugs.   Family History:  The patient's family history includes CAD in his father; Heart murmur in his sister; Hypertension in his brother and mother; Lymphoma in his mother.    ROS:  Please see the history of present illness. All other systems are reviewed and  Negative to the above problem except as noted.    PHYSICAL EXAM: VS:  BP 126/60 mmHg  Pulse 88  Ht 5' 4"  (1.626 m)  Wt 190 lb 3.2 oz (86.274 kg)  BMI 32.63 kg/m2  SpO2 97%  GEN: Well nourished, well developed, in no acute distress HEENT: normal Neck: no JVD, carotid bruits, or masses Cardiac: RRR; no murmurs, rubs, or gallops,no edema  Respiratory:  clear to auscultation bilaterally, normal work of breathing GI:  soft, nontender, nondistended, + BS  No hepatomegaly  MS: no deformity Moving all extremities   Skin: warm and dry, no rash Neuro:  Strength and sensation are intact Psych: euthymic mood, full affect   EKG:  EKG is not ordered today.   Lipid Panel    Component Value Date/Time   CHOL 115 09/15/2015 0909   TRIG 99.0 09/15/2015 0909   HDL 44.90 09/15/2015 0909   CHOLHDL 3 09/15/2015 0909   VLDL 19.8 09/15/2015 0909   LDLCALC 51 09/15/2015 0909      Wt Readings from Last 3 Encounters:  03/22/16 190 lb 3.2 oz (86.274 kg)  11/26/15 187 lb 3.2 oz (84.913 kg)  11/18/15 185 lb (83.915 kg)      ASSESSMENT AND PLAN: 1CAD  Cath results as noted  ? pericarditis Doing well now  COntinue current regimen    2  HL Lipids were excellent in Sept 2016  3  UC  No symtpoms at present    4.  Ortho  From a cardiac standpoint, pt is at low risk for a major cardiac event and OK to proceed   F/U in December  2017   Signed, Dorris Carnes, MD  03/22/2016 2:03 PM    [address], [place], [place]  [address] Phone: [phone]; Fax: [phone]

## 2016-05-11 ENCOUNTER — Other Ambulatory Visit: Payer: Self-pay | Admitting: Internal Medicine

## 2016-05-12 NOTE — Telephone Encounter (Signed)
Please refill Pt should have follow up appt in cardiology this summer

## 2016-05-13 ENCOUNTER — Telehealth: Payer: Self-pay | Admitting: Internal Medicine

## 2016-05-13 MED ORDER — COLCHICINE 0.6 MG PO TABS: 0.6000 mg | ORAL_TABLET | Freq: Two times a day (BID) | ORAL | Status: DC

## 2016-05-13 NOTE — Telephone Encounter (Signed)
°*  STAT* If patient is at the pharmacy, call can be transferred to refill team.   1. Which medications need to be refilled? (please list name of each medication and dose if known) Colchicine   2. Which pharmacy/location (including street and city if local pharmacy) is medication to be sent to? [facility]  3. Do they need a 30 day or 90 day supply? [place]

## 2016-05-13 NOTE — Telephone Encounter (Signed)
Please advise on request. Thanks, MI

## 2016-05-13 NOTE — Telephone Encounter (Signed)
New message       *STAT* If patient is at the pharmacy, call can be transferred to refill team.   1. Which medications need to be refilled? (please list name of each medication and dose if known) colchicine 0.44m----1 tab bid 2. Which pharmacy/location (including street and city if local pharmacy) [facility] [address]  3. Do they need a 30 day or 90 day supply? 30 day Pt states he received pres while in hosp

## 2016-05-13 NOTE — Telephone Encounter (Signed)
Spoke with patient. He had reported NOT taking colchicine at last OV-Dr. Harrington Challenger plan states continue same medication regimen.  Patient ran out of colchicine about 10 days ago.  Uses twice daily for about a week at times when he gets the chest pain that was thought to be pericarditis.  This is effective.  He is currently not having any symptoms.  Refilled colchicine.  Forwarding to Dr. Harrington Challenger to inform and for any other recommendations.

## 2016-05-14 NOTE — Telephone Encounter (Signed)
Agree with plan as pt is doing

## 2016-07-29 ENCOUNTER — Ambulatory Visit
Admission: RE | Admit: 2016-07-29 | Discharge: 2016-07-29 | Disposition: A | Discharge status: Home / Self Care | Payer: 59 | Source: Ambulatory Visit | Attending: Physician Assistant | Admitting: Physician Assistant

## 2016-07-29 ENCOUNTER — Telehealth: Payer: Self-pay | Admitting: Internal Medicine

## 2016-07-29 ENCOUNTER — Ambulatory Visit (INDEPENDENT_AMBULATORY_CARE_PROVIDER_SITE_OTHER): Payer: 59 | Admitting: Physician Assistant

## 2016-07-29 ENCOUNTER — Telehealth: Payer: Self-pay | Admitting: Cardiovascular Disease

## 2016-07-29 ENCOUNTER — Telehealth: Payer: Self-pay | Admitting: *Deleted

## 2016-07-29 ENCOUNTER — Encounter: Payer: Self-pay | Admitting: Physician Assistant

## 2016-07-29 VITALS — BP 132/86 | HR 62 | Ht 64.0 in | Wt 185.0 lb

## 2016-07-29 DIAGNOSIS — E785 Hyperlipidemia, unspecified: Secondary | ICD-10-CM | POA: Diagnosis not present

## 2016-07-29 DIAGNOSIS — R079 Chest pain, unspecified: Secondary | ICD-10-CM

## 2016-07-29 DIAGNOSIS — I2510 Atherosclerotic heart disease of native coronary artery without angina pectoris: Secondary | ICD-10-CM | POA: Diagnosis not present

## 2016-07-29 DIAGNOSIS — I309 Acute pericarditis, unspecified: Secondary | ICD-10-CM

## 2016-07-29 DIAGNOSIS — Z9861 Coronary angioplasty status: Secondary | ICD-10-CM

## 2016-07-29 LAB — CBC
HCT: 41.3 % (ref 38.5–50.0)
HEMOGLOBIN: 13.9 g/dL (ref 13.2–17.1)
MCH: 29.1 pg (ref 27.0–33.0)
MCHC: 33.7 g/dL (ref 32.0–36.0)
MCV: 86.4 fL (ref 80.0–100.0)
MPV: 9.7 fL (ref 7.5–12.5)
PLATELETS: 236 10*3/uL (ref 140–400)
RBC: 4.78 MIL/uL (ref 4.20–5.80)
RDW: 14 % (ref 11.0–15.0)
WBC: 5.4 10*3/uL (ref 3.8–10.8)

## 2016-07-29 LAB — BASIC METABOLIC PANEL
BUN: 10 mg/dL (ref 7–25)
CALCIUM: 9.6 mg/dL (ref 8.6–10.3)
CO2: 25 mmol/L (ref 20–31)
CREATININE: 0.78 mg/dL (ref 0.70–1.33)
Chloride: 107 mmol/L (ref 98–110)
Glucose, Bld: 106 mg/dL — ABNORMAL HIGH (ref 65–99)
Potassium: 4.2 mmol/L (ref 3.5–5.3)
SODIUM: 139 mmol/L (ref 135–146)

## 2016-07-29 LAB — C-REACTIVE PROTEIN: CRP: <0.5 mg/dL (ref <0.60)

## 2016-07-29 MED ORDER — PREDNISONE 20 MG PO TABS: 30.0000 mg | ORAL_TABLET | Freq: Every day | ORAL | 0 refills | Status: DC

## 2016-07-29 MED ORDER — OMEPRAZOLE 20 MG PO CPDR: 20.0000 mg | DELAYED_RELEASE_CAPSULE | Freq: Every day | ORAL | 11 refills | Status: DC

## 2016-07-29 NOTE — Patient Instructions (Addendum)
Medication Instructions:  Your physician has recommended you make the following change in your medication:  1.  START Prednisone 20 mg taking 1 1/2 tablet daily 2.  START Prilosec 20 mg taking 1 tablet daily  Labwork: TODAY:  BMET, CBC, SEDRATE, & CRP  Testing/Procedures: A chest x-ray takes a picture of the organs and structures inside the chest, including the heart, lungs, and blood vessels. This test can show several things, including, whether the heart is enlarges; whether fluid is building up in the lungs; and whether pacemaker / defibrillator leads are still in place.  Your physician has requested that you have an echocardiogram. Echocardiography is a painless test that uses sound waves to create images of your heart. It provides your doctor with information about the size and shape of your heart and how well your heart's chambers and valves are working. This procedure takes approximately one hour. There are no restrictions for this procedure.   Follow-Up: Your physician recommends that you schedule a follow-up appointment in: 08/09/16 ARRIVE AT 11:00 FOR AN APPT WITH MICHELLE LENZE, PA-C   Any Other Special Instructions Will Be Listed Below (If Applicable). X-rays X-rays are tests that create pictures of the inside of your body using radiation. Different body parts absorb different amounts of radiation, which show up on the X-ray pictures in shades of black, gray, and white.  X-rays are used to look for many health conditions, including broken bones, lung problems, and some types of cancer.  LET [facility] CARE PROVIDER KNOW ABOUT:  Any allergies you have.  All medicines you are taking, including vitamins, herbs, eye drops, creams, and over-the-counter medicines.  Previous surgeries you have had.  Medical conditions you have. RISK AND COMPLICATIONS Getting an X-ray is a safe procedure.  BEFORE THE PROCEDURE  Tell the X-ray technician if you are pregnant or might be  pregnant.  You may be asked to wear a protective lead apron to hide parts of your body from the X-ray.  You usually will need to undress whatever part of your body needs the X-ray. You will be given a hospital gown to wear, if needed.  You may need to remove your glasses, jewelry, and other metal objects. PROCEDURE   The X-ray machine creates a picture by using a tiny burst of radiation. It is painless.  You may need to have several pictures taken at different angles.  You will need to try to be as still as you can during the examination to get the best possible images. AFTER THE PROCEDURE  You will be able to resume your normal activities.  The X-ray will be examined by your health care provider or a radiology specialist.  It is your responsibility to get your test results. Ask your health care provider when to expect your results and how to get them.   This information is not intended to replace advice given to you by your health care provider. Make sure you discuss any questions you have with your health care provider.   Document Released: 12/06/2005 Document Revised: 12/27/2014 Document Reviewed: 01/30/2014 Elsevier Interactive Patient Education 2016 [employer].    Echocardiogram An echocardiogram, or echocardiography, uses sound waves (ultrasound) to produce an image of your heart. The echocardiogram is simple, painless, obtained within a short period of time, and offers valuable information to your health care provider. The images from an echocardiogram can provide information such as:  Evidence of coronary artery disease (CAD).  Heart size.  Heart muscle function.  Heart valve function.  Aneurysm detection.  Evidence of a past heart attack.  Fluid buildup around the heart.  Heart muscle thickening.  Assess heart valve function. LET [facility] CARE PROVIDER KNOW ABOUT:  Any allergies you have.  All medicines you are taking, including vitamins, herbs, eye  drops, creams, and over-the-counter medicines.  Previous problems you or members of your family have had with the use of anesthetics.  Any blood disorders you have.  Previous surgeries you have had.  Medical conditions you have.  Possibility of pregnancy, if this applies. BEFORE THE PROCEDURE  No special preparation is needed. Eat and drink normally.  PROCEDURE   In order to produce an image of your heart, gel will be applied to your chest and a wand-like tool (transducer) will be moved over your chest. The gel will help transmit the sound waves from the transducer. The sound waves will harmlessly bounce off your heart to allow the heart images to be captured in real-time motion. These images will then be recorded.  You may need an IV to receive a medicine that improves the quality of the pictures. AFTER THE PROCEDURE You may return to your normal schedule including diet, activities, and medicines, unless your health care provider tells you otherwise.   This information is not intended to replace advice given to you by your health care provider. Make sure you discuss any questions you have with your health care provider.   Document Released: 12/03/2000 Document Revised: 12/27/2014 Document Reviewed: 08/13/2013 Elsevier Interactive Patient Education [employer].    If you need a refill on your cardiac medications before your next appointment, please call your pharmacy.

## 2016-07-29 NOTE — Telephone Encounter (Signed)
New message   Pt c/o of Chest Pain: STAT if CP now or developed within 24 hours  1. Are you having CP right now? Yes  2. Are you experiencing any other symptoms (ex. SOB, nausea, vomiting, sweating)? no 3. How long have you been experiencing CP? Weeks and today its worse 4. Is your CP continuous or coming and going? Coming and going 5. Have you taken Nitroglycerin? no?

## 2016-07-29 NOTE — Telephone Encounter (Signed)
Calling stating he has been having CP similar to pain he had last Nov but woke him up this AM with more pronounced pain. Pain in mid chest. Denies radiating to arm or neck.  Denies SOB. Has a cough and pain is worse when he coughs.  He was told he had pericarditis last Nov and he believes this is the similar pain.  Has been off the Colchicine 0.6 mg BID until 2 wks ago when he restarted taking. Spoke w/Dayna Dunn,PA who advises for him to come in at 9:00 this AM. He come as scheduled.

## 2016-07-29 NOTE — Telephone Encounter (Signed)
Returning Tyler Estrada's call.  Advised of the results of the chest xray.  He was wondering if results of blood work was back.  Advised did not see the results and someone would call him when [facility] had commented on them.  He verbalizes understanding.

## 2016-07-29 NOTE — Telephone Encounter (Signed)
called to verify Prednisone dosage from todays OV, let pt know it was 1.5 tablets (30 mg) instead of 20 mg, reaffirmed this with Melina Copa, pt aware & expressed understanding.   ASSESSMENT & PLAN:    1. Chest pain - Concerning for recurrent pericarditis. Worse with laying down and cough. Better with laying forward. Diffuse minimal PR depression with ST elevation. Will treat emperically with colchicine for 3 months (started him self approximately 2 weeks ago however no improvement), longer if on-going symptoms.  Unable to add NSAID due to ulcerative colitis. Takes Ultram as PRN for pain. Will start prednison 73m qd  then taper down slowly depending on how he is doing until clinic visit. Will get CRP and Sed rate as baseline given UC. Get CBC, BMET, Cxr and limited echo. Start Nexium for GI protection. Vital signs stable today.

## 2016-07-29 NOTE — Progress Notes (Signed)
Cardiology Office Note    Date:  07/29/2016   ID:  Tyler Estrada, DOB 1956/03/16, MRN [id number]  PCP:  Eloise Levels, NP  Cardiologist:  Dr. Harrington Challenger  Chief Complaint: Chest pain   History of Present Illness:   Tyler Estrada is a 60 y.o. male h/o CAD s/p DES to the RCA in 2008, HLD, and ulcerative colitis on chronic mesalamine therapy and pericarditis who added to schedule for chest pain.   A cath in 2008Showed 50% LAD 90% D1; 100% RCA LVEF 55% Patient underwent PCI/stent.   He was in his usual state of health until 11/18/2015, when he developed sudden upper chest pressure associated with dyspnea and fatigue.  ECG was notable for inferior ST elevation, which was similar, though more prominent than what was noted on previous tracings.underwent diagnostic catheterization on 11/18/15 revealing non-obstructive CAD with minimal in-stent restenosis within the RCA. Medical therapy was recommended. CT angio of the chest with contrast was then performed and was negative for pulmonary embolus. Mr. [name] continued to have pleuritic chest dbniscomfort and it is felt that this most likely represent pericarditis. No effusion.  Rx empirically with colchicine.   He was doing well on cardiac stand point when last seen by Dr. Harrington Challenger 03/22/16.   The patient has been having chest pain similar to pain when he was dx with pericarditis but less intensity. He describes the pain as sharp underneath left breast. No radiation. Worse with laying down and cough. Better with laying forward. He started taking colchicine 0.80m BID x 2 weeks  However no improvement. Had worse episode this morning that woke him up from sleep. Denies recent fever, chills, orthopnea, PND, syncope, LE edema, abdominal pain, nausea or vomiting.   Past Medical History:  Diagnosis Date  . Coronary artery disease    a. s/p DES to RCA in 2008;  b. 10/2015 Cath: LM nl, LAD 50ost/p, OM1/2 min irregs, RCA 219mSR, 3023mISR, EF 65%.  .  Hyperlipidemia   . Iron deficiency anemia due to chronic blood loss 09/10/2015  . Pericarditis    a. 10/2015->colchicine.  . UMarland Kitchencerative colitis with complication ([facility]/28/11/7516 Past Surgical History:  Procedure Laterality Date  . CARDIAC CATHETERIZATION N/A 11/18/2015   Procedure: Left Heart Cath and Coronary Angiography;  Surgeon: MicSherren MochaD;  Location: [facility];  Service: Cardiovascular;  Laterality: N/A;  . CORONARY ANGIOPLASTY WITH STENT PLACEMENT     a. Cordis stent to RCA in 2008 2.28m14m18mm45mHIP SURGERY      Current Medications: Prior to Admission medications   Medication Sig Start Date End Date Taking? Authorizing Provider  aspirin EC 81 MG tablet Take 1 tablet (81 mg total) by mouth daily. 09/13/14   Paula[place] Records colchicine 0.6 MG tablet Take 1 tablet (0.6 mg total) by mouth 2 (two) times daily. 05/13/16   PaulaFay Records desloratadine (CLARINEX) 5 MG tablet Take 5 mg by mouth daily.    Historical Provider, MD  mesalamine (ASACOL) 400 MG EC tablet Take 1,600 mg by mouth 2 (two) times daily.     Historical Provider, MD  metoprolol tartrate (LOPRESSOR) 25 MG tablet Take 0.5 tablets (12.5 mg total) by mouth daily. 11/26/15   KathrEileen StanfordC  Multiple Vitamins-Minerals (CENTRUM SILVER PO) Take 1 tablet by mouth daily.     Historical Provider, MD  nitroGLYCERIN (NITROSTAT) 0.4 MG SL tablet Place 1 tablet (0.4 mg total) under  the tongue every 5 (five) minutes x 3 doses as needed for chest pain. 11/19/15   Rogelia Mire, NP  Omega-3 Fatty Acids (FISH OIL) 1200 MG CAPS Take 1 capsule by mouth 2 (two) times daily. Take 1 tablet by mouth twice a day    Historical Provider, MD  simvastatin (ZOCOR) 40 MG tablet Take 1 tablet (40 mg total) by mouth daily. 11/26/15   [name], PA-C  traMADol (ULTRAM) 50 MG tablet TAKE 1 TABLET BY MOUTH EVERY 8-12 HOURS AS NEEDED for pain 11/21/15   Historical Provider, MD    Allergies:   Review of  patient's allergies indicates no known allergies.   Social History   Social History  . Marital status: Married    Spouse name: N/A  . Number of children: N/A  . Years of education: N/A   Social History Main Topics  . Smoking status: Never Smoker  . Smokeless tobacco: None  . Alcohol use 0.0 oz/week     Comment: 1-2 drinks per week.  . Drug use: No  . Sexual activity: Not Asked   Other Topics Concern  . None   Social History Narrative  . None     Family History:  The patient's family history includes CAD in his father; Heart murmur in his sister; Hypertension in his brother and mother; Lymphoma in his mother.   ROS:   Please see the history of present illness.    ROS All other systems reviewed and are negative.   PHYSICAL EXAM:   VS:  BP 132/86 (BP Location: Right Arm, Patient Position: Sitting, Cuff Size: Large)   Pulse 62   Ht 5' 4"  (1.626 m)   Wt 185 lb (83.9 kg)   BMI 31.76 kg/m    GEN: Well nourished, well developed, in no acute distress  HEENT: normal  Neck: no JVD, carotid bruits, or masses Cardiac: RRR; no murmurs, rubs, or gallops,no edema  Respiratory:  clear to auscultation bilaterally, normal work of breathing GI: soft, nontender, nondistended, + BS MS: no deformity or atrophy  Skin: warm and dry, no rash Neuro:  Alert and Oriented x 3, Strength and sensation are intact Psych: euthymic mood, full affect  Wt Readings from Last 3 Encounters:  07/29/16 185 lb (83.9 kg)  03/22/16 190 lb 3.2 oz (86.3 kg)  11/26/15 187 lb 3.2 oz (84.9 kg)      Studies/Labs Reviewed:   EKG:  EKG is ordered today.  The ekg ordered today demonstrates sinus rhythm at rate of 62 bpm. Diffuse minimal PR depression with ST elevation.   Recent Labs: 09/15/2015: Pro B Natriuretic peptide (BNP) 29.0 11/18/2015: Hemoglobin 13.0; Platelets 218 11/19/2015: BUN 8; Creatinine, Ser 0.77; Potassium 3.7; Sodium 139   Lipid Panel    Component Value Date/Time   CHOL 115 09/15/2015  0909   TRIG 99.0 09/15/2015 0909   HDL 44.90 09/15/2015 0909   CHOLHDL 3 09/15/2015 0909   VLDL 19.8 09/15/2015 0909   LDLCALC 51 09/15/2015 0909    Additional studies/ records that were reviewed today include:   Echocardiogram: 11/19/15  LV EF: 60% -   65%  ------------------------------------------------------------------- Indications:      Chest pain 786.51.  ------------------------------------------------------------------- History:   PMH:   Coronary artery disease.  Risk factors: Dyslipidemia.  ------------------------------------------------------------------- Study Conclusions  - Left ventricle: The cavity size was normal. Wall thickness was   normal. Systolic function was normal. The estimated ejection   fraction was in the range of 60% to  65%. Wall motion was normal;   there were no regional wall motion abnormalities. Doppler   parameters are consistent with abnormal left ventricular   relaxation (grade 1 diastolic dysfunction).   Cardiac Catheterization: 11/18/15 Left Heart Cath and Coronary Angiography  Conclusion   1. Patent stents in the RCA with mild in-stent restenosis 2. Moderate proximal LAD stenosis, do not suspect 'flow-limiting' lesion 3. Widely patent and large left circumflex without stenosis 4. Vigorous LV systolic function with normal LVEDP  Recommend: suspect noncardiac chest pain. Pt with 7/10 chest pain on cath table with TIMI-3 flow and no high-grade coronary obstruction. Will refer urgently for CTA - PE study to evaluate for pulmonary embolus.       ASSESSMENT & PLAN:    1. Chest pain - Concerning for recurrent pericarditis. Worse with laying down and cough. Better with laying forward. Diffuse minimal PR depression with ST elevation. Will treat emperically with colchicine for 3 months (started him self approximately 2 weeks ago however no improvement), longer if on-going symptoms.  Unable to add NSAID due to ulcerative colitis.  Takes Ultram as PRN for pain. Will start prednison 4m qd  then taper down slowly depending on how he is doing until clinic visit. Will get CRP and Sed rate as baseline given UC. Get CBC, BMET, Cxr and limited echo. Start Nexium for GI protection. Vital signs stable today.    2. [place] revealing non-obstructive CAD with minimal in-stent restenosis within the RCA. Medical therapy was recommended. Continue ASA, BB and statin.  3. HLD - 09/15/2015: Cholesterol 115; HDL 44.90; LDL Cholesterol 51; Triglycerides 99.0; VLDL 19.8  - Continue statin  4. Ulcerative colitis - On chronic mesalamine therpay   Medication Adjustments/Labs and Tests Ordered: Current medicines are reviewed at length with the patient today.  Concerns regarding medicines are outlined above.  Medication changes, Labs and Tests ordered today are listed in the Patient Instructions below. Patient Instructions  Medication Instructions:  Your physician has recommended you make the following change in your medication:  1.  START Prednisone 20 mg taking 1 1/2 tablet daily 2.  START Prilosec 20 mg taking 1 tablet daily  Labwork: TODAY:  BMET, CBC, SEDRATE, & CRP  Testing/Procedures: A chest x-ray takes a picture of the organs and structures inside the chest, including the heart, lungs, and blood vessels. This test can show several things, including, whether the heart is enlarges; whether fluid is building up in the lungs; and whether pacemaker / defibrillator leads are still in place.  Your physician has requested that you have an echocardiogram. Echocardiography is a painless test that uses sound waves to create images of your heart. It provides your doctor with information about the size and shape of your heart and how well your heart's chambers and valves are working. This procedure takes approximately one hour. There are no restrictions for this procedure.   Follow-Up: Your physician  recommends that you schedule a follow-up appointment in: 08/09/16 ARRIVE AT 11:00 FOR AN APPT WITH MICHELLE LENZE, PA-C   Any Other Special Instructions Will Be Listed Below (If Applicable). X-rays X-rays are tests that create pictures of the inside of your body using radiation. Different body parts absorb different amounts of radiation, which show up on the X-ray pictures in shades of black, gray, and white.  X-rays are used to look for many health conditions, including broken bones, lung problems, and some types of cancer.  LET YOUR  HEALTH CARE PROVIDER KNOW ABOUT:  Any allergies you have.  All medicines you are taking, including vitamins, herbs, eye drops, creams, and over-the-counter medicines.  Previous surgeries you have had.  Medical conditions you have. RISK AND COMPLICATIONS Getting an X-ray is a safe procedure.  BEFORE THE PROCEDURE  Tell the X-ray technician if you are pregnant or might be pregnant.  You may be asked to wear a protective lead apron to hide parts of your body from the X-ray.  You usually will need to undress whatever part of your body needs the X-ray. You will be given a hospital gown to wear, if needed.  You may need to remove your glasses, jewelry, and other metal objects. PROCEDURE   The X-ray machine creates a picture by using a tiny burst of radiation. It is painless.  You may need to have several pictures taken at different angles.  You will need to try to be as still as you can during the examination to get the best possible images. AFTER THE PROCEDURE  You will be able to resume your normal activities.  The X-ray will be examined by your health care provider or a radiology specialist.  It is your responsibility to get your test results. Ask your health care provider when to expect your results and how to get them.   This information is not intended to replace advice given to you by your health care provider. Make sure you discuss any  questions you have with your health care provider.   Document Released: 12/06/2005 Document Revised: 12/27/2014 Document Reviewed: 01/30/2014 Elsevier Interactive Patient Education 2016 [employer].    Echocardiogram An echocardiogram, or echocardiography, uses sound waves (ultrasound) to produce an image of your heart. The echocardiogram is simple, painless, obtained within a short period of time, and offers valuable information to your health care provider. The images from an echocardiogram can provide information such as:  Evidence of coronary artery disease (CAD).  Heart size.  Heart muscle function.  Heart valve function.  Aneurysm detection.  Evidence of a past heart attack.  Fluid buildup around the heart.  Heart muscle thickening.  Assess heart valve function. LET [facility] CARE PROVIDER KNOW ABOUT:  Any allergies you have.  All medicines you are taking, including vitamins, herbs, eye drops, creams, and over-the-counter medicines.  Previous problems you or members of your family have had with the use of anesthetics.  Any blood disorders you have.  Previous surgeries you have had.  Medical conditions you have.  Possibility of pregnancy, if this applies. BEFORE THE PROCEDURE  No special preparation is needed. Eat and drink normally.  PROCEDURE   In order to produce an image of your heart, gel will be applied to your chest and a wand-like tool (transducer) will be moved over your chest. The gel will help transmit the sound waves from the transducer. The sound waves will harmlessly bounce off your heart to allow the heart images to be captured in real-time motion. These images will then be recorded.  You may need an IV to receive a medicine that improves the quality of the pictures. AFTER THE PROCEDURE You may return to your normal schedule including diet, activities, and medicines, unless your health care provider tells you otherwise.   This information is  not intended to replace advice given to you by your health care provider. Make sure you discuss any questions you have with your health care provider.   Document Released: 12/03/2000 Document Revised: 12/27/2014 Document  Reviewed: 08/13/2013 Elsevier Interactive Patient Education [employer].    If you need a refill on your cardiac medications before your next appointment, please call your pharmacy.      Jarrett Soho, [place]  07/29/2016 9:57 AM    [facility] [place], [place], [place]  [address] Phone: [phone]; Fax: [phone]

## 2016-07-29 NOTE — Telephone Encounter (Signed)
New message ° ° ° ° ° ° °Pt returning nurse call  °

## 2016-07-30 LAB — SEDIMENTATION RATE: Sed Rate: 1 mm/h (ref 0–20)

## 2016-08-06 ENCOUNTER — Other Ambulatory Visit (HOSPITAL_COMMUNITY): Payer: 59

## 2016-08-09 ENCOUNTER — Ambulatory Visit: Payer: 59 | Admitting: Physician Assistant

## 2016-08-12 ENCOUNTER — Ambulatory Visit: Payer: 59 | Admitting: Physician Assistant

## 2016-08-17 ENCOUNTER — Other Ambulatory Visit: Payer: Self-pay | Admitting: Internal Medicine

## 2016-08-24 ENCOUNTER — Other Ambulatory Visit: Payer: Self-pay | Admitting: Physician Assistant

## 2016-08-25 ENCOUNTER — Telehealth: Payer: Self-pay | Admitting: *Deleted

## 2016-08-25 NOTE — Telephone Encounter (Signed)
Cannot refill  See other phone note.

## 2016-08-25 NOTE — Telephone Encounter (Signed)
Spoke with Dr. Harrington Challenger re: request from [facility] to refill prednisone 30 mg daily.  Pt was to have follow up and echo but has not had those, also did not have taper dose of prednisone ordered.  Per Dr. Harrington Challenger patient needs seen ASAP, needs labs.  Called patient. He was unaware of the refill request. Has been tapering the prednisone for a few weeks due to blurry vision. Wife is nurse who has helped him taper.  Since 9/1 he has been taking 5 mg daily.   Still taking colchicine BID.  Since he was seen on 8/10, he has had 2 brief episodes of pain, "like someone is punching the inside of my chest".  They subside and go away after 1/2 hr.  Both times he was under work related stress.  Also he discussed that he takes Asacol for ulcerative colitis and takes extra when he has a flare.  He noted the pericarditis symptoms each time he took extra.   Researched side effects and believes it could be the cause.  Currently feeling well.  I scheduled him with APP on 9/11 and he is aware I am forwarding to Dr. Harrington Challenger for further recommendations and will call him back with any.

## 2016-08-25 NOTE — Telephone Encounter (Signed)
Pharmacy asking for a refill on prednisone 20 mg tablet. Pt is to taper off until next appointment with Dr. Harrington Challenger, per [facility],  which is 10/07/16 at 8:00 am. Would you like to refill this medication until the appt date. Please advise

## 2016-08-25 NOTE — Telephone Encounter (Signed)
Would actually cut back to 2.5 mg per day for 1 week and then off

## 2016-08-25 NOTE — Telephone Encounter (Signed)
Will defer management per Dr. Harrington Challenger. The patient has missed f/u appointment.

## 2016-08-25 NOTE — Telephone Encounter (Signed)
Cannot rewrite for prednisone  Pt needs to be seen in clinic this week  On review was started on prednisone 8/10 while I was out of town  Did not have echo as ordered. Needs to be seen before rewriting Rx or changing to confirm status .

## 2016-08-26 MED ORDER — PREDNISONE 2.5 MG PO TABS: 2.5000 mg | ORAL_TABLET | Freq: Every day | ORAL | 0 refills | Status: AC

## 2016-08-26 NOTE — Telephone Encounter (Signed)
Patient informed to continue prednisone at 2.5 mg once a day for 1 week, [facility], keep appointment with APP on 08/30/16.  He voices understanding and appreciation.

## 2016-08-27 ENCOUNTER — Encounter: Payer: Self-pay | Admitting: Physician Assistant

## 2016-08-30 ENCOUNTER — Ambulatory Visit (INDEPENDENT_AMBULATORY_CARE_PROVIDER_SITE_OTHER): Payer: 59 | Admitting: Physician Assistant

## 2016-08-30 ENCOUNTER — Encounter: Payer: Self-pay | Admitting: Physician Assistant

## 2016-08-30 ENCOUNTER — Encounter (INDEPENDENT_AMBULATORY_CARE_PROVIDER_SITE_OTHER): Payer: Self-pay

## 2016-08-30 VITALS — BP 126/78 | HR 74 | Ht 64.0 in | Wt 188.4 lb

## 2016-08-30 DIAGNOSIS — I2510 Atherosclerotic heart disease of native coronary artery without angina pectoris: Secondary | ICD-10-CM | POA: Diagnosis not present

## 2016-08-30 DIAGNOSIS — E785 Hyperlipidemia, unspecified: Secondary | ICD-10-CM

## 2016-08-30 DIAGNOSIS — I319 Disease of pericardium, unspecified: Secondary | ICD-10-CM

## 2016-08-30 DIAGNOSIS — Z8679 Personal history of other diseases of the circulatory system: Secondary | ICD-10-CM | POA: Insufficient documentation

## 2016-08-30 NOTE — Progress Notes (Signed)
Cardiology Office Note    Date:  08/30/2016   ID:  Tyler Estrada, DOB 09/09/1956, MRN [id number]  PCP:  Tyler Levels, NP  Cardiologist:  Dr. Harrington Estrada  Chief Complaint: Pericarditis follow up  History of Present Illness:   Tyler Estrada is a 60 y.o. male h/o CAD s/p DES to the RCA in 2008, HLD, and ulcerative colitis on chronic mesalamine therapy and pericarditis who presented for follow up.   Acath in 2008Showed 50% LAD 90% D1; 100% RCA LVEF 55% Patient underwent PCI/stent. He was in his usual state of health until 11/18/2015, when he developed sudden upper chest pressure associated with dyspnea and fatigue. ECG was notable for inferior ST elevation, which was similar, though more prominent than what was noted on previous tracings.underwent diagnostic catheterization on 11/18/15 revealing non-obstructive CAD with minimal in-stent restenosis within the RCA. Medical therapy was recommended. CT angio of the chest with contrast was then performed and was negative for pulmonary embolus. Tyler Estrada continued to have pleuritic chest discomfort and it is felt that this most likely represent pericarditis. No effusion. Rx empirically with colchicine.   He was doing well on cardiac stand point when last seen by Dr. Harrington Estrada 03/22/16.   Seen in clinic 07/29/16 for chest pain similar to pain when he was dx with pericarditis but less intensity. He started taking colchicine 0.79m BID x 2 weeks  However no improvement. EKG showed diffuse minimal PR depression with ST elevation. He was  treated emperically with colchicine for 3 months. He was placed on prednison 313mqd  and plan was to taper down during follow up appointment. He missed 2 appointments since then. Also he did not showed up for echo.   The patient has episode of blurred vision and then patient took taper dose of prednisone (wife is nurse who has helped him taper). Since 9/1 he has been taking 5 mg daily. The was advised to cut back to  2.41m36md 08/25/16 for one week and then discontinue.   He is here today for follow up. Blurred vision has been resolved on taper dose. He starts that this is second time he has blurred vision on prednisone (did not mentioned last time). His chest pain has been resolved except one episode of pleuritic chest pain about 2 weeks ago. No dizziness, SOB, orthopnea, PND, syncope or LE edema. He thinks that his recurrent pericarditic episode likely due to increased in Asacol prior to presentation of 07/29/16. His first episode of pericarditis also occurred in setting of up-titrating Asacol.    Past Medical History:  Diagnosis Date  . Coronary artery disease    a. s/p DES to RCA in 2008;  b. 10/2015 Cath: LM nl, LAD 50ost/p, OM1/2 min irregs, RCA 241m42m, 73m/1mR, EF 65%.  . Hyperlipidemia   . Iron deficiency anemia due to chronic blood loss 09/10/2015  . Pericarditis    a. 10/2015->colchicine.  . UlcMarland Kitchenrative colitis with complication (HCC) [place]1/3/84/5364ast Surgical History:  Procedure Laterality Date  . CARDIAC CATHETERIZATION N/A 11/18/2015   Procedure: Left Heart Cath and Coronary Angiography;  Surgeon: MichaSherren Estrada  [facility];  Service: Cardiovascular;  Laterality: N/A;  . CORONARY ANGIOPLASTY WITH STENT PLACEMENT     a. Cordis stent to RCA in 2008 2.741mm 74mmm  14mP SURGERY      Current Medications: Prior to Admission medications   Medication Sig Start Date End Date Taking? Authorizing Provider  aspirin EC  81 MG tablet Take 1 tablet (81 mg total) by mouth daily. 09/13/14   Tyler Records, MD  colchicine 0.6 MG tablet Take 1 tablet (0.6 mg total) by mouth 2 (two) times daily. 05/13/16   Tyler Records, MD  desloratadine (CLARINEX) 5 MG tablet Take 5 mg by mouth daily.    Historical Provider, MD  mesalamine (ASACOL) 400 MG EC tablet Take 1,600 mg by mouth 2 (two) times daily.     Historical Provider, MD  metoprolol tartrate (LOPRESSOR) 25 MG tablet Take 0.5 tablets (12.5  mg total) by mouth daily. 11/26/15   Tyler Stanford, PA-C  Multiple Vitamins-Minerals (CENTRUM SILVER PO) Take 1 tablet by mouth daily.     Historical Provider, MD  nitroGLYCERIN (NITROSTAT) 0.4 MG SL tablet Place 1 tablet (0.4 mg total) under the tongue every 5 (five) minutes x 3 doses as needed for chest pain. 11/19/15   Tyler Mire, NP  Omega-3 Fatty Acids (FISH OIL) 1200 MG CAPS Take 1 capsule by mouth 2 (two) times daily. Take 1 tablet by mouth twice a day    Historical Provider, MD  omeprazole (PRILOSEC) 20 MG capsule Take 1 capsule (20 mg total) by mouth daily. 07/29/16   Tyler Kail, PA  predniSONE (DELTASONE) 2.5 MG tablet Take 1 tablet (2.5 mg total) by mouth daily with breakfast. 08/26/16 09/02/16  Tyler Records, MD  simvastatin (ZOCOR) 40 MG tablet TAKE 1 TABLET (40 MG TOTAL) BY MOUTH DAILY. 08/17/16   Tyler Records, MD  traMADol (ULTRAM) 50 MG tablet TAKE 1 TABLET BY MOUTH EVERY 8-12 HOURS AS NEEDED for pain 11/21/15   Historical Provider, MD    Allergies:   Prednisone   Social History   Social History  . Marital status: Married    Spouse name: N/A  . Number of children: N/A  . Years of education: N/A   Social History Main Topics  . Smoking status: Never Smoker  . Smokeless tobacco: Never Used  . Alcohol use 0.0 oz/week     Comment: 1-2 drinks per week.  . Drug use: No  . Sexual activity: Not Asked   Other Topics Concern  . None   Social History Narrative  . None     Family History:  The patient's family history includes CAD in his father; Heart murmur in his sister; Hypertension in his brother and mother; Lymphoma in his mother.   ROS:   Please see the history of present illness.    ROS All other systems reviewed and are negative.   PHYSICAL EXAM:   VS:  BP 126/78   Pulse 74   Ht 5' 4"  (1.626 m)   Wt 188 lb 6.4 oz (85.5 kg)   BMI 32.34 kg/m    GEN: Well nourished, well developed, in no acute distress  HEENT: normal  Neck: no JVD, carotid  bruits, or masses Cardiac: RRR; no murmurs, rubs, or gallops,no edema  Respiratory:  clear to auscultation bilaterally, normal work of breathing GI: soft, nontender, nondistended, + BS MS: no deformity or atrophy  Skin: warm and dry, no rash Neuro:  Alert and Oriented x 3, Strength and sensation are intact Psych: euthymic mood, full affect  Wt Readings from Last 3 Encounters:  08/30/16 188 lb 6.4 oz (85.5 kg)  07/29/16 185 lb (83.9 kg)  03/22/16 190 lb 3.2 oz (86.3 kg)      Studies/Labs Reviewed:   EKG:  EKG is not ordered today.   Recent Labs: 09/15/2015:  Pro B Natriuretic peptide (BNP) 29.0 07/29/2016: BUN 10; Creat 0.78; Hemoglobin 13.9; Platelets 236; Potassium 4.2; Sodium 139   Lipid Panel    Component Value Date/Time   CHOL 115 09/15/2015 0909   TRIG 99.0 09/15/2015 0909   HDL 44.90 09/15/2015 0909   CHOLHDL 3 09/15/2015 0909   VLDL 19.8 09/15/2015 0909   LDLCALC 51 09/15/2015 0909    Additional studies/ Estrada that were reviewed today include:   Echocardiogram: 11/19/15  LV EF: 60% - 65%  ------------------------------------------------------------------- Indications: Chest pain 786.51.  ------------------------------------------------------------------- History: PMH: Coronary artery disease. Risk factors: Dyslipidemia.  ------------------------------------------------------------------- Study Conclusions  - Left ventricle: The cavity size was normal. Wall thickness was normal. Systolic function was normal. The estimated ejection fraction was in the range of 60% to 65%. Wall motion was normal; there were no regional wall motion abnormalities. Doppler parameters are consistent with abnormal left ventricular relaxation (grade 1 diastolic dysfunction).   Cardiac Catheterization: 11/18/15 Left Heart Cath and Coronary Angiography  Conclusion   1. Patent stents in the RCA with mild in-stent restenosis 2. Moderate proximal  LAD stenosis, do not suspect 'flow-limiting' lesion 3. Widely patent and large left circumflex without stenosis 4. Vigorous LV systolic function with normal LVEDP  Recommend: suspect noncardiac chest pain. Pt with 7/10 chest pain on cath table with TIMI-3 flow and no high-grade coronary obstruction. Will refer urgently for CTA - PE study to evaluate for pulmonary embolus.      ASSESSMENT & PLAN:    1. Recurrent pericarditis - Symptoms improve, almost resolved. Continue to taper dose of prednisone as scheduled. Continue Colchicine. Will determine length of therapy during next office visit. Likely his pericarditis in setting of of up-titrating Asacol (both times). He will discuss this with his GI.     2. CAD s/p DES to RCA in 2008 - [place] 11/18/15 revealing non-obstructive CAD with minimal in-stent restenosis within the RCA. Medical therapy was recommended. Continue ASA, BB and statin.  3. HLD - 09/15/2015: Cholesterol 115; HDL 44.90; LDL Cholesterol 51; Triglycerides 99.0; VLDL 19.8  - Continue statin  4. Ulcerative colitis - On chronic mesalamine therpay. As above.    Medication Adjustments/Labs and Tests Ordered: Current medicines are reviewed at length with the patient today.  Concerns regarding medicines are outlined above.  Medication changes, Labs and Tests ordered today are listed in the Patient Instructions below. Patient Instructions  Medication Instructions:   Your physician recommends that you continue on your current medications as directed. Please refer to the Current Medication list given to you today.    If you need a refill on your cardiac medications before your next appointment, please call your pharmacy.  Labwork: NONE ORDER TODAY    Testing/Procedures: NONE ORDER TODAY    Follow-Up: KEEP APPOINTMENT WITH DR ROSS AS SCHEDULED   Any Other Special Instructions Will Be Listed Below (If Applicable).  Jarrett Soho, [place]  08/30/2016 11:54 AM    [facility] [address], [place], [place]  [address] Phone: [phone]; Fax: [phone]

## 2016-08-30 NOTE — Patient Instructions (Addendum)
Medication Instructions:   Your physician recommends that you continue on your current medications as directed. Please refer to the Current Medication list given to you today.    If you need a refill on your cardiac medications before your next appointment, please call your pharmacy.  Labwork: NONE ORDER TODAY    Testing/Procedures: NONE ORDER TODAY    Follow-Up: KEEP APPOINTMENT WITH DR ROSS AS SCHEDULED   Any Other Special Instructions Will Be Listed Below (If Applicable).

## 2016-10-07 ENCOUNTER — Ambulatory Visit (INDEPENDENT_AMBULATORY_CARE_PROVIDER_SITE_OTHER): Payer: 59 | Admitting: Internal Medicine

## 2016-10-07 ENCOUNTER — Other Ambulatory Visit: Payer: Self-pay

## 2016-10-07 ENCOUNTER — Encounter (INDEPENDENT_AMBULATORY_CARE_PROVIDER_SITE_OTHER): Payer: Self-pay

## 2016-10-07 ENCOUNTER — Encounter: Payer: Self-pay | Admitting: Internal Medicine

## 2016-10-07 VITALS — BP 136/84 | HR 68 | Ht 64.0 in | Wt 191.2 lb

## 2016-10-07 DIAGNOSIS — E785 Hyperlipidemia, unspecified: Secondary | ICD-10-CM

## 2016-10-07 DIAGNOSIS — I2510 Atherosclerotic heart disease of native coronary artery without angina pectoris: Secondary | ICD-10-CM

## 2016-10-07 DIAGNOSIS — Z9861 Coronary angioplasty status: Secondary | ICD-10-CM | POA: Diagnosis not present

## 2016-10-07 LAB — CBC WITH DIFFERENTIAL/PLATELET
BASOS ABS: 58 {cells}/uL (ref 0–200)
Basophils Relative: 1 %
EOS ABS: 290 {cells}/uL (ref 15–500)
Eosinophils Relative: 5 %
HEMATOCRIT: 42.4 % (ref 38.5–50.0)
HEMOGLOBIN: 14.3 g/dL (ref 13.2–17.1)
LYMPHS ABS: 1740 {cells}/uL (ref 850–3900)
LYMPHS PCT: 30 %
MCH: 29.4 pg (ref 27.0–33.0)
MCHC: 33.7 g/dL (ref 32.0–36.0)
MCV: 87.2 fL (ref 80.0–100.0)
MONO ABS: 870 {cells}/uL (ref 200–950)
MPV: 9.4 fL (ref 7.5–12.5)
Monocytes Relative: 15 %
NEUTROS PCT: 49 %
Neutro Abs: 2842 cells/uL (ref 1500–7800)
Platelets: 233 10*3/uL (ref 140–400)
RBC: 4.86 MIL/uL (ref 4.20–5.80)
RDW: 13.4 % (ref 11.0–15.0)
WBC: 5.8 10*3/uL (ref 3.8–10.8)

## 2016-10-07 LAB — LIPID PANEL
CHOL/HDL RATIO: 2.8 ratio (ref <=5.0)
Cholesterol: 113 mg/dL — ABNORMAL LOW (ref 125–200)
HDL: 41 mg/dL (ref >=40)
LDL CALC: 58 mg/dL (ref <130)
Triglycerides: 71 mg/dL (ref <150)
VLDL: 14 mg/dL (ref <30)

## 2016-10-07 NOTE — Progress Notes (Signed)
Cardiology Office Note   Date:  10/07/2016   ID:  Tyler Estrada, DOB 08-04-1956, MRN [id number]  PCP:  Eloise Levels, NP  Cardiologist:   Dorris Carnes, MD    F/U of CAD    History of Present Illness: Tyler Estrada is a 60 y.o. male with a history ofCAD  A cath in 2008Showed 50% LAD 90% D1; 100% RCA LVEF 55% Patient underwent PCI/stent He was hosp in November  Inferior STEMI   Cath on 11/18/15 showed nonobstructie CAD with minimal instent restenosis to the RCA  CT angio of chest neg for PE   CP felt to possibly be pericarditis  No effusion  Rx empirically with colchicine.   Seen in Dec by Kathlene November   Seen in august by B Bhagat  Was having sharp chest pain  Took  colchicine with no improvement  Prednisone started   He is feeling good  NO CP Breathing is OK  He thinks recent flare may have been associated with UC flare He is now on colchicine only      Outpatient Medications Prior to Visit  Medication Sig Dispense Refill  . aspirin EC 81 MG tablet Take 1 tablet (81 mg total) by mouth daily. 90 tablet 3  . colchicine 0.6 MG tablet Take 1 tablet (0.6 mg total) by mouth 2 (two) times daily. 60 tablet 3  . desloratadine (CLARINEX) 5 MG tablet Take 5 mg by mouth daily.    . mesalamine (ASACOL) 400 MG EC tablet Take 1,600 mg by mouth 2 (two) times daily.     . metoprolol tartrate (LOPRESSOR) 25 MG tablet Take 0.5 tablets (12.5 mg total) by mouth daily. 15 tablet 11  . Multiple Vitamins-Minerals (CENTRUM SILVER PO) Take 1 tablet by mouth daily.     . nitroGLYCERIN (NITROSTAT) 0.4 MG SL tablet Place 1 tablet (0.4 mg total) under the tongue every 5 (five) minutes x 3 doses as needed for chest pain. 25 tablet 3  . Omega-3 Fatty Acids (FISH OIL) 1200 MG CAPS Take 1 capsule by mouth 2 (two) times daily. Take 1 tablet by mouth twice a day    . omeprazole (PRILOSEC) 20 MG capsule Take 1 capsule (20 mg total) by mouth daily. 30 capsule 11  . simvastatin (ZOCOR) 40 MG tablet TAKE 1 TABLET  (40 MG TOTAL) BY MOUTH DAILY. 30 tablet 11  . traMADol (ULTRAM) 50 MG tablet TAKE 1 TABLET BY MOUTH EVERY 8-12 HOURS AS NEEDED for pain  1   No facility-administered medications prior to visit.      Allergies:   Prednisone   Past Medical History:  Diagnosis Date  . Coronary artery disease    a. s/p DES to RCA in 2008;  b. 10/2015 Cath: LM nl, LAD 50ost/p, OM1/2 min irregs, RCA 75mISR, 335m ISR, EF 65%.  . Hyperlipidemia   . Iron deficiency anemia due to chronic blood loss 09/10/2015  . Pericarditis    a. 10/2015->colchicine.  . Marland Kitchenlcerative colitis with complication ([id number]/5/36/[id number]  Past Surgical History:  Procedure Laterality Date  . CARDIAC CATHETERIZATION N/A 11/18/2015   Procedure: Left Heart Cath and Coronary Angiography;  Surgeon: MiSherren MochaMD;  [facility];  Service: Cardiovascular;  Laterality: N/A;  . CORONARY ANGIOPLASTY WITH STENT PLACEMENT     a. Cordis stent to RCA in 2008 2.7544m 14m88m HIP SURGERY       Social History:  The patient  reports that he has  never smoked. He has never used smokeless tobacco. He reports that he drinks alcohol. He reports that he does not use drugs.   Family History:  The patient's family history includes CAD in his father; Heart murmur in his sister; Hypertension in his brother and mother; Lymphoma in his mother.    ROS:  Please see the history of present illness. All other systems are reviewed and  Negative to the above problem except as noted.    PHYSICAL EXAM: VS:  BP 136/84   Pulse 68   Ht 5' 4"  (1.626 m)   Wt 191 lb 3.2 oz (86.7 kg)   SpO2 97%   BMI 32.82 kg/m   GEN: Well nourished, well developed, in no acute distress  HEENT: normal  Neck: no JVD, carotid bruits, or masses Cardiac: RRR; no murmurs, rubs, or gallops,no edema  Respiratory:  clear to auscultation bilaterally, normal work of breathing GI: soft, nontender, nondistended, + BS  No hepatomegaly  MS: no deformity Moving all  extremities   Skin: warm and dry, no rash Neuro:  Strength and sensation are intact Psych: euthymic mood, full affect   EKG:  EKG is ordered today.   Lipid Panel    Component Value Date/Time   CHOL 115 09/15/2015 0909   TRIG 99.0 09/15/2015 0909   HDL 44.90 09/15/2015 0909   CHOLHDL 3 09/15/2015 0909   VLDL 19.8 09/15/2015 0909   LDLCALC 51 09/15/2015 0909      Wt Readings from Last 3 Encounters:  10/07/16 191 lb 3.2 oz (86.7 kg)  08/30/16 188 lb 6.4 oz (85.5 kg)  07/29/16 185 lb (83.9 kg)      ASSESSMENT AND PLAN: 1  CAD  Cath in Nov 2016 patent stent  Rx for pericarditis    Had a couple spells of poss pericarditis  This has resolved  I would taper colchicine to qd and then stop after a couple months    2  HL  Check lipids   3  UC    4  Ortho  Activity limited by knee DJD  From cardiac standpoint he is low risk for any surgery      Current medicines are reviewed at length with the patient today.  The patient does not have concerns regarding medicines.  Signed, Dorris Carnes, MD  10/07/2016 8:46 AM    [address], [place], [place]  [address] Phone: [phone]; Fax: [phone]

## 2016-10-07 NOTE — Patient Instructions (Signed)
Medication Instructions:  Your physician recommends that you continue on your current medications as directed. Please refer to the Current Medication list given to you today.   Labwork: Lipid profile/CBCd today  Testing/Procedures: None   Follow-Up: Your physician wants you to follow-up in: July 2018 with Dr Harrington Challenger. You will receive a reminder letter in the mail two months in advance. If you don't receive a letter, please call our office to schedule the follow-up appointment.        If you need a refill on your cardiac medications before your next appointment, please call your pharmacy.

## 2016-10-08 ENCOUNTER — Telehealth: Payer: Self-pay | Admitting: Internal Medicine

## 2016-10-08 NOTE — Telephone Encounter (Signed)
New message    Pt verbalized that he is returning call for rn for labs

## 2016-10-08 NOTE — Telephone Encounter (Signed)
  Notified the pt of his lab results and recommendations per Dr Harrington Challenger, as mentioned below: Pt verbalized understanding and agrees with this plan.    Notes Recorded by Fay Records, MD on 10/07/2016 at 10:20 PM EDT CBC is normal Lpids are excellent  Keep on same meds   Notes Recorded by Fay Records, MD on 10/07/2016 at 3:43 PM EDT CBC is normal No change in meds

## 2016-11-14 ENCOUNTER — Other Ambulatory Visit: Payer: Self-pay | Admitting: Physician Assistant

## 2017-08-01 ENCOUNTER — Other Ambulatory Visit: Payer: Self-pay | Admitting: Internal Medicine

## 2017-09-01 ENCOUNTER — Other Ambulatory Visit: Payer: Self-pay | Admitting: Internal Medicine

## 2017-09-29 ENCOUNTER — Other Ambulatory Visit: Payer: Self-pay | Admitting: *Deleted

## 2017-09-29 MED ORDER — SIMVASTATIN 40 MG PO TABS: 40.0000 mg | ORAL_TABLET | Freq: Every day | ORAL | 0 refills | Status: DC

## 2017-11-12 ENCOUNTER — Other Ambulatory Visit: Payer: Self-pay | Admitting: Internal Medicine

## 2017-12-12 ENCOUNTER — Other Ambulatory Visit: Payer: Self-pay

## 2017-12-12 MED ORDER — METOPROLOL TARTRATE 25 MG PO TABS: 12.5000 mg | ORAL_TABLET | Freq: Every day | ORAL | 0 refills | Status: DC

## 2017-12-16 ENCOUNTER — Other Ambulatory Visit: Payer: Self-pay | Admitting: Internal Medicine

## 2017-12-16 MED ORDER — METOPROLOL TARTRATE 25 MG PO TABS: 12.5000 mg | ORAL_TABLET | Freq: Every day | ORAL | 0 refills | Status: DC

## 2017-12-16 NOTE — Addendum Note (Signed)
Addended by: Derl Barrow on: 12/16/2017 11:26 AM   Modules accepted: Orders

## 2017-12-20 HISTORY — PX: COLONOSCOPY: SHX174

## 2017-12-23 DIAGNOSIS — M1712 Unilateral primary osteoarthritis, left knee: Secondary | ICD-10-CM | POA: Diagnosis not present

## 2017-12-26 ENCOUNTER — Other Ambulatory Visit: Payer: Self-pay | Admitting: Internal Medicine

## 2017-12-26 MED ORDER — SIMVASTATIN 40 MG PO TABS: 40.0000 mg | ORAL_TABLET | Freq: Every day | ORAL | 0 refills | Status: DC

## 2017-12-29 ENCOUNTER — Ambulatory Visit (INDEPENDENT_AMBULATORY_CARE_PROVIDER_SITE_OTHER): Payer: 59 | Admitting: Internal Medicine

## 2017-12-29 ENCOUNTER — Encounter: Payer: Self-pay | Admitting: Internal Medicine

## 2017-12-29 VITALS — BP 134/88 | HR 67 | Ht 64.0 in | Wt 177.2 lb

## 2017-12-29 DIAGNOSIS — I10 Essential (primary) hypertension: Secondary | ICD-10-CM | POA: Diagnosis not present

## 2017-12-29 DIAGNOSIS — Z9861 Coronary angioplasty status: Secondary | ICD-10-CM | POA: Diagnosis not present

## 2017-12-29 DIAGNOSIS — I2510 Atherosclerotic heart disease of native coronary artery without angina pectoris: Secondary | ICD-10-CM

## 2017-12-29 DIAGNOSIS — E785 Hyperlipidemia, unspecified: Secondary | ICD-10-CM

## 2017-12-29 LAB — LIPID PANEL
CHOL/HDL RATIO: 2.6 ratio (ref 0.0–5.0)
Cholesterol, Total: 138 mg/dL (ref 100–199)
HDL: 53 mg/dL (ref >39)
LDL Calculated: 71 mg/dL (ref 0–99)
Triglycerides: 72 mg/dL (ref 0–149)
VLDL Cholesterol Cal: 14 mg/dL (ref 5–40)

## 2017-12-29 LAB — BASIC METABOLIC PANEL
BUN / CREAT RATIO: 18 (ref 10–24)
BUN: 14 mg/dL (ref 8–27)
CHLORIDE: 102 mmol/L (ref 96–106)
CO2: 24 mmol/L (ref 20–29)
Calcium: 9.6 mg/dL (ref 8.6–10.2)
Creatinine, Ser: 0.8 mg/dL (ref 0.76–1.27)
GFR calc Af Amer: 111 mL/min/{1.73_m2} (ref >59)
GFR calc non Af Amer: 96 mL/min/{1.73_m2} (ref >59)
GLUCOSE: 106 mg/dL — AB (ref 65–99)
Potassium: 4.6 mmol/L (ref 3.5–5.2)
SODIUM: 141 mmol/L (ref 134–144)

## 2017-12-29 LAB — CBC
HEMATOCRIT: 41.9 % (ref 37.5–51.0)
Hemoglobin: 14 g/dL (ref 13.0–17.7)
MCH: 29.6 pg (ref 26.6–33.0)
MCHC: 33.4 g/dL (ref 31.5–35.7)
MCV: 89 fL (ref 79–97)
Platelets: 295 10*3/uL (ref 150–379)
RBC: 4.73 x10E6/uL (ref 4.14–5.80)
RDW: 14.1 % (ref 12.3–15.4)
WBC: 6.1 10*3/uL (ref 3.4–10.8)

## 2017-12-29 MED ORDER — SIMVASTATIN 40 MG PO TABS: 40.0000 mg | ORAL_TABLET | Freq: Every day | ORAL | 3 refills | Status: DC

## 2017-12-29 MED ORDER — METOPROLOL TARTRATE 25 MG PO TABS: 12.5000 mg | ORAL_TABLET | Freq: Every day | ORAL | 3 refills | Status: DC

## 2017-12-29 NOTE — Patient Instructions (Signed)
Your physician recommends that you continue on your current medications as directed. Please refer to the Current Medication list given to you today. Your physician recommends that you return for lab work today --cbc, lipid, bmet Your physician wants you to follow-up in: 1 year with Dr. Harrington Challenger.  You will receive a reminder letter in the mail two months in advance. If you don't receive a letter, please call our office to schedule the follow-up appointment.

## 2017-12-29 NOTE — Progress Notes (Signed)
Cardiology Office Note   Date:  12/29/2017   ID:  Caedmon Louque, DOB 1956/08/30, MRN [id number]  PCP:  Eloise Levels, NP (Inactive)  Cardiologist:   Dorris Carnes, MD    F/U of CAD    History of Present Illness: Tyler Estrada is a 62 y.o. male with a history ofCAD  A cath in 2008Showed 50% LAD 90% D1; 100% RCA LVEF 55% Patient underwent PCI/stent  Cath on 11/18/15 showed nonobstructie CAD with minimal instent restenosis to the RCA  CT angio of chest neg for PE   CP felt to possibly be pericarditis  No effusion  Rx empirically with colchicine.    Pt seen in Oct 2017    Since then he denies CP  Feeling good  No SOB  About to retire    Outpatient Medications Prior to Visit  Medication Sig Dispense Refill  . aspirin EC 81 MG tablet Take 1 tablet (81 mg total) by mouth daily. 90 tablet 3  . desloratadine (CLARINEX) 5 MG tablet Take 5 mg by mouth daily.    . mesalamine (ASACOL) 400 MG EC tablet Take 1,600 mg by mouth 2 (two) times daily.     . metoprolol tartrate (LOPRESSOR) 25 MG tablet Take 0.5 tablets (12.5 mg total) by mouth daily. Please keep upcoming appt in January with Dr. Harrington Challenger. Thanks 45 tablet 0  . Multiple Vitamins-Minerals (CENTRUM SILVER PO) Take 1 tablet by mouth daily.     . nitroGLYCERIN (NITROSTAT) 0.4 MG SL tablet Place 1 tablet (0.4 mg total) under the tongue every 5 (five) minutes x 3 doses as needed for chest pain. 25 tablet 3  . Omega-3 Fatty Acids (FISH OIL) 1200 MG CAPS Take 1 capsule by mouth 2 (two) times daily. Take 1 tablet by mouth twice a day    . simvastatin (ZOCOR) 40 MG tablet Take 1 tablet (40 mg total) by mouth daily. Please keep 12/29/17 appointment for further refills 30 tablet 0  . colchicine 0.6 MG tablet Take 1 tablet (0.6 mg total) by mouth 2 (two) times daily. (Patient not taking: Reported on 12/29/2017) 60 tablet 3  . omeprazole (PRILOSEC) 20 MG capsule Take 1 capsule (20 mg total) by mouth daily. (Patient not taking: Reported on 12/29/2017) 30  capsule 11  . traMADol (ULTRAM) 50 MG tablet TAKE 1 TABLET BY MOUTH EVERY 8-12 HOURS AS NEEDED for pain  1   No facility-administered medications prior to visit.      Allergies:   Prednisone   Past Medical History:  Diagnosis Date  . Coronary artery disease    a. s/p DES to RCA in 2008;  b. 10/2015 Cath: LM nl, LAD 50ost/p, OM1/2 min irregs, RCA 49mISR, 355m ISR, EF 65%.  . Hyperlipidemia   . Iron deficiency anemia due to chronic blood loss 09/10/2015  . Pericarditis    a. 10/2015->colchicine.  . Marland Kitchenlcerative colitis with complication ([id number]/1/85/6314  Past Surgical History:  Procedure Laterality Date  . CARDIAC CATHETERIZATION N/A 11/18/2015   Procedure: Left Heart Cath and Coronary Angiography;  Surgeon: MiSherren MochaMD;  [facility];  Service: Cardiovascular;  Laterality: N/A;  . CORONARY ANGIOPLASTY WITH STENT PLACEMENT     a. Cordis stent to RCA in 2008 2.7561m 36m36m HIP SURGERY       Social History:  The patient  reports that  has never smoked. he has never used smokeless tobacco. He reports that he drinks alcohol. He reports that  he does not use drugs.   Family History:  The patient's family history includes CAD in his father; Heart murmur in his sister; Hypertension in his brother and mother; Lymphoma in his mother.    ROS:  Please see the history of present illness. All other systems are reviewed and  Negative to the above problem except as noted.    PHYSICAL EXAM: VS:  BP 134/88   Pulse 67   Ht 5' 4"  (1.626 m)   Wt 177 lb 3.2 oz (80.4 kg)   BMI 30.42 kg/m   BP on my check 122/84     GEN: Well nourished, well developed, in no acute distress  HEENT: normal  Neck: JVP normal   carotid bruits, or masses Cardiac: RRR; no murmurs, rubs, or gallops,no edema  Respiratory:  clear to auscultation bilaterally, normal work of breathing GI: soft, nontender, nondistended, + BS  No hepatomegaly  MS: no deformity Moving all extremities   Skin:  warm and dry, no rash Neuro:  Strength and sensation are intact Psych: euthymic mood, full affect   EKG:  EKG is ordered today.  SR 67 bpm     Lipid Panel    Component Value Date/Time   CHOL 113 (L) 10/07/2016 0857   TRIG 71 10/07/2016 0857   HDL 41 10/07/2016 0857   CHOLHDL 2.8 10/07/2016 0857   VLDL 14 10/07/2016 0857   LDLCALC 58 10/07/2016 0857      Wt Readings from Last 3 Encounters:  12/29/17 177 lb 3.2 oz (80.4 kg)  10/07/16 191 lb 3.2 oz (86.7 kg)  08/30/16 188 lb 6.4 oz (85.5 kg)      ASSESSMENT AND PLAN: 1  CAD  Pt without anginal symptoms  Keep on same regimen   Encouraged him to try stationary bike (has knee problems) 2  HL  Check lipids today    3  HTN  BP is improved on second check    3  UC    Denies active issues   4  Ortho  Activity limited by knee DJD  From cardiac standpoint he is low risk for any surgery      Current medicines are reviewed at length with the patient today.  The patient does not have concerns regarding medicines.  Signed, Dorris Carnes, MD  12/29/2017 9:50 AM    [address], [place], [place]  [address] Phone: [phone]; Fax: [phone]

## 2017-12-30 ENCOUNTER — Telehealth: Payer: Self-pay

## 2017-12-30 NOTE — Telephone Encounter (Signed)
-----   Message from Fay Records, MD sent at 12/29/2017 11:24 PM EST ----- CBC is normal   Electrolytes and kidney funciton are OK Lpids are very good

## 2017-12-30 NOTE — Telephone Encounter (Signed)
Spoke with patient and advised him of recent lab results per Dr. Harrington Challenger. Patient verbalized understanding and had no questions.

## 2018-01-12 DIAGNOSIS — B349 Viral infection, unspecified: Secondary | ICD-10-CM | POA: Diagnosis not present

## 2018-01-12 DIAGNOSIS — R509 Fever, unspecified: Secondary | ICD-10-CM | POA: Diagnosis not present

## 2018-03-29 DIAGNOSIS — M1712 Unilateral primary osteoarthritis, left knee: Secondary | ICD-10-CM | POA: Diagnosis not present

## 2018-03-29 DIAGNOSIS — M25562 Pain in left knee: Secondary | ICD-10-CM | POA: Diagnosis not present

## 2018-05-03 ENCOUNTER — Telehealth: Payer: Self-pay | Admitting: *Deleted

## 2018-05-03 NOTE — Telephone Encounter (Signed)
Patient called and requested a refill on colchicine. Not on his current med list but patient reports that he takes this medication off and on. He would like a call back at [phone]. He has a few pills on hand. Please advise. Thanks, MI

## 2018-05-04 MED ORDER — COLCHICINE 0.6 MG PO TABS: 0.6000 mg | ORAL_TABLET | Freq: Every day | ORAL | 3 refills | Status: DC

## 2018-05-04 NOTE — Telephone Encounter (Signed)
Pains off and on-same symptoms that he ended up in hospital with. Was given colchicine then and it's been helpful. Same sharp stabbing pain under left breast area.  The pain is not nearly as bad as originally. Last few seconds only, can be once a day or every other day or couple times per day.  Not as intense, otherwise same as before.  No other symptoms.  Took one every day this week because it happened couple times at beginning of week.  Took last one today. It is sporadic Cant tell what brings it on.  Can be just sitting and it hits him.    Advised we would refill colchicine 0.6 mg one tablet daily for now and that I will forward this to Dr. Harrington Challenger and if she has any new recommendations we will call him back.

## 2018-05-04 NOTE — Telephone Encounter (Signed)
Okay to refill? Please advise. Thanks, MI

## 2018-05-05 NOTE — Telephone Encounter (Signed)
See note below

## 2018-05-05 NOTE — Telephone Encounter (Signed)
Unusual for pericarditis to be so fleeting. ? Muscular   ? Skeletal Would try colchicine to see if hepss

## 2018-05-09 ENCOUNTER — Other Ambulatory Visit: Payer: Self-pay

## 2018-05-09 MED ORDER — MITIGARE 0.6 MG PO CAPS: 0.6000 mg | ORAL_CAPSULE | Freq: Every day | ORAL | 1 refills | Status: DC

## 2018-05-10 NOTE — Telephone Encounter (Signed)
I called patient and informed of Dr. Alan Ripper comments. Advised patient to follow up with PCP if symptoms persist in this manner. Advised to call back if symptoms worsen. He was appreciative for my call.

## 2018-06-08 DIAGNOSIS — K51919 Ulcerative colitis, unspecified with unspecified complications: Secondary | ICD-10-CM | POA: Diagnosis not present

## 2018-06-21 DIAGNOSIS — M1712 Unilateral primary osteoarthritis, left knee: Secondary | ICD-10-CM | POA: Diagnosis not present

## 2018-06-21 DIAGNOSIS — M25562 Pain in left knee: Secondary | ICD-10-CM | POA: Diagnosis not present

## 2018-07-11 DIAGNOSIS — L309 Dermatitis, unspecified: Secondary | ICD-10-CM | POA: Diagnosis not present

## 2018-09-02 DIAGNOSIS — Z23 Encounter for immunization: Secondary | ICD-10-CM | POA: Diagnosis not present

## 2018-09-05 ENCOUNTER — Ambulatory Visit: Payer: 59 | Admitting: Family Medicine

## 2018-09-06 DIAGNOSIS — R21 Rash and other nonspecific skin eruption: Secondary | ICD-10-CM | POA: Diagnosis not present

## 2018-09-06 DIAGNOSIS — L308 Other specified dermatitis: Secondary | ICD-10-CM | POA: Diagnosis not present

## 2018-09-13 ENCOUNTER — Telehealth: Payer: Self-pay

## 2018-09-13 NOTE — Telephone Encounter (Signed)
Please advise if okay for patient to be worked in sooner?     Copied from [place] [id number]. Topic: Appointment Scheduling - Scheduling Inquiry for Clinic >> Sep 11, 2018  2:00 PM Hewitt Shorts wrote: Pt was scheduled to see Dr. Deborra Medina on 09/05/18 and it was cancelled due to provider not being in the office  and provider is scheduling all the way into next year pt feels this is not fair to him and would like to be worked in sooner since the cancellation was not his doing  Best number   [phone]

## 2018-09-13 NOTE — Telephone Encounter (Signed)
Can we get him in with Dr. Zigmund Daniel sooner?

## 2018-09-15 NOTE — Telephone Encounter (Signed)
Is there anything we can do for this pt?

## 2018-09-15 NOTE — Telephone Encounter (Signed)
Patient wife called to cancel her appointment on 09/19/18 at 12.15 pm due to issues with driving from recent surgery. She was hoping that her husband would had been able to get this time slot but it was not possible since he is a new patient. She was hoping that since his last appointment was cancelled through no fault of his that the Dr  would make an exception this one time. Also request a call back with a decision. Ph# [phone]

## 2018-09-26 ENCOUNTER — Telehealth: Payer: Self-pay | Admitting: Internal Medicine

## 2018-09-26 ENCOUNTER — Telehealth: Payer: Self-pay | Admitting: *Deleted

## 2018-09-26 NOTE — Telephone Encounter (Signed)
   [facility] Pre-operative Risk Assessment    Request for surgical clearance:  1. What type of surgery is being performed? Colonoscopy    2. When is this surgery scheduled? 10-06-18    3. What type of clearance is required (medical clearance vs. Pharmacy clearance to hold med vs. Both)? pharmacy   4. Are there any medications that need to be held prior to surgery and how long? Plavix   5. Practice name and name of physician performing surgery? Dr. Watt Climes   6. What is your office phone number:  [phone]    7.   What is your office fax number [phone]  8.   Anesthesia type (None, local, MAC, general) ? General    Tyler Estrada M 09/26/2018, 11:17 AM  _________________________________________________________________   (provider comments below)

## 2018-09-26 NOTE — Telephone Encounter (Signed)
ERROR

## 2018-09-26 NOTE — Telephone Encounter (Signed)
   [facility] Pre-operative Risk Assessment    Request for surgical clearance:  1. What type of surgery is being performed? Left Parial Knee replacement   2. When is this surgery scheduled? 11/30/18   3. What type of clearance is required (medical clearance vs. Pharmacy clearance to hold med vs. Both)? Both  4. Are there any medications that need to be held prior to surgery and how long? ASA for 7 days prior   5. Practice name and name of physician performing surgery? Tyler Estrada   6. What is your office phone number [phone] Tyler Estrada)    7.   What is your office fax number [phone]  8.   Anesthesia type (None, local, MAC, general) ? Spinal   Tyler Estrada 09/26/2018, 2:20 PM  _________________________________________________________________   (provider comments below)

## 2018-09-29 NOTE — Telephone Encounter (Signed)
   Primary Cardiologist:Paula Harrington Challenger, MD  Chart reviewed as part of pre-operative protocol coverage. Because of Bodee Hoes's past medical history and time since last visit, he/she will require a follow-up visit in order to better assess preoperative cardiovascular risk.  Pre-op covering staff:  For 2 procedures, the first is on 10/06/18 Colonoscopy and ortho surgery TBA. - Please schedule appointment and call patient to inform them. - Please contact requesting surgeon's office via preferred method (i.e, phone, fax) to inform them of need for appointment prior to surgery.  Cecilie Kicks, NP  09/29/2018, 12:20 PM

## 2018-09-29 NOTE — Telephone Encounter (Signed)
Left a detailed message for pt to call the office to make an appt to get surgical clearance.

## 2018-10-02 NOTE — Telephone Encounter (Signed)
S/w pt toady and he has been scheduled to see [place] PA 10/16 for pre op clearance. Pt is having colonoscopy 10/18 and knee surgery 12/2. Pt would like to be cleared for both surgeries on the 10/04/18 visit if possible please. Pt states he does not have a PCP and that he has a list of lab work that surgeon's office will need. I advised pt to please bring that information with him to d/w PA. Pt thanked me for the call.

## 2018-10-03 ENCOUNTER — Encounter: Payer: Self-pay | Admitting: Physician Assistant

## 2018-10-04 ENCOUNTER — Encounter: Payer: Self-pay | Admitting: Physician Assistant

## 2018-10-04 ENCOUNTER — Ambulatory Visit (INDEPENDENT_AMBULATORY_CARE_PROVIDER_SITE_OTHER): Payer: 59 | Admitting: Physician Assistant

## 2018-10-04 VITALS — BP 142/84 | HR 68 | Ht 64.0 in | Wt 185.0 lb

## 2018-10-04 DIAGNOSIS — Z01810 Encounter for preprocedural cardiovascular examination: Secondary | ICD-10-CM | POA: Diagnosis not present

## 2018-10-04 DIAGNOSIS — I10 Essential (primary) hypertension: Secondary | ICD-10-CM

## 2018-10-04 DIAGNOSIS — I2510 Atherosclerotic heart disease of native coronary artery without angina pectoris: Secondary | ICD-10-CM | POA: Diagnosis not present

## 2018-10-04 DIAGNOSIS — E785 Hyperlipidemia, unspecified: Secondary | ICD-10-CM | POA: Diagnosis not present

## 2018-10-04 NOTE — Progress Notes (Signed)
OK to hold ASA 7 days prior

## 2018-10-04 NOTE — Telephone Encounter (Signed)
Clearance letter has been recv'd by surgeon's office. I will remove from the call back pool.

## 2018-10-04 NOTE — Telephone Encounter (Signed)
Call back staff: Marland Kitchen Clearance letter has been faxed to the requesting surgeon (Dr. Watt Climes and Dr. Alvan Dame). . Please contact the surgeon's office to ensure it has been received. . This phone note will be removed from the preop pool. . Please sign encounter when completed.  Richardson Dopp, PA-C    10/04/2018 1:43 PM

## 2018-10-04 NOTE — Patient Instructions (Signed)
Medication Instructions:  1. Your physician recommends that you continue on your current medications as directed. Please refer to the Current Medication list given to you today.  If you need a refill on your cardiac medications before your next appointment, please call your pharmacy.   Lab work: TODAY CMET, HGB A1C, U/A  If you have labs (blood work) drawn today and your tests are completely normal, you will receive your results only by: Marland Kitchen MyChart Message (if you have MyChart) OR . A paper copy in the mail If you have any lab test that is abnormal or we need to change your treatment, we will call you to review the results.  Testing/Procedures: NONE ORDERED   Follow-Up: At [facility], you and your health needs are our priority.  As part of our continuing mission to provide you with exceptional heart care, we have created designated Provider Care Teams.  These Care Teams include your primary Cardiologist (physician) and Advanced Practice Providers (APPs -  Physician Assistants and Nurse Practitioners) who all work together to provide you with the care you need, when you need it. You will need a follow up appointment in:  4 months.  Please call our office 2 months in advance to schedule this appointment.  You may see Dorris Carnes, MD or one of the following Advanced Practice Providers on your designated Care Team: Richardson Dopp, PA-C [place], [place] . Daune Perch, NP  Any Other Special Instructions Will Be Listed Below (If Applicable).

## 2018-10-04 NOTE — Progress Notes (Signed)
Cardiology Office Note:    Date:  10/04/2018   ID:  Tyler Estrada, DOB [birth year], MRN [id number]  PCP:  Eloise Levels, NP  Cardiologist:  Dorris Carnes, MD   Electrophysiologist:  None   Referring MD: Eloise Levels, NP   Chief Complaint  Patient presents with  . Surgical Clearance     History of Present Illness:    Tyler Estrada is a 62 y.o. male with coronary artery disease status post prior stenting to the RCA in 2008, prior pericarditis treated with colchicine, hypertension, hyperlipidemia, ulcerative colitis.  Cardiac catheterization in November 2016 demonstrated patent stents in the RCA and moderate nonobstructive disease in the LAD.  Ejection fraction by echocardiogram in 2016 was normal.  Tyler Estrada was last seen by Dr. Harrington Challenger in January 2019.     Tyler Estrada for preoperative cardiovascular risk assessment.  Tyler Estrada needs a colonoscopy on October 18 with Dr. Watt Climes and a left partial knee replacement in December 2019 with Dr. Alvan Dame.  Tyler Estrada is here alone today.  Tyler Estrada has not had any chest discomfort consistent with Tyler Estrada prior angina, shortness of breath, syncope, paroxysmal nocturnal dyspnea or lower extremity swelling.  Tyler Estrada knee limits him somewhat but Tyler Estrada is able to achieve >4 METs.    Prior CV studies:   The following studies were reviewed today:  Echocardiogram 11/19/2015 EF 60-65, normal wall motion, grade 1 diastolic dysfunction  Cardiac catheterization 11/18/2015 LM calcified, normal LAD ostial 50 OM1 irregularities, OM2 irregularities RCA proximal stent patent with 25 ISR, mid stent patent with 30 ISR EF >65  Chest CTA 11/18/2015 FINDINGS: There are no filling defects in the pulmonary arterial tree to suggest acute pulmonary thromboembolism. Negative for abnormal adenopathy. Three vessel coronary artery calcification. No pneumothorax.  No pleural effusion Mild dependent atelectasis bilaterally. No acute bony deformity. Review of the MIP images confirms the above  findings. IMPRESSION: No evidence of acute pulmonary thromboembolism.  Nuclear stress test 10/2010 [place], [place]) EF 76, no ischemia  Past Medical History:  Diagnosis Date  . Coronary artery disease    a. s/p DES to RCA in 2008;  b. 10/2015 Cath: LM nl, LAD 50ost/p, OM1/2 min irregs, RCA 11mISR, 357m ISR, EF 65%.  . Echocardiogram    Echo 11/16: EF 60-65, normal wall motion, grade 1 diastolic dysfunction  . Hyperlipidemia   . Iron deficiency anemia due to chronic blood loss 09/10/2015  . Pericarditis    a. 10/2015->colchicine.  . Marland Kitchenlcerative colitis with complication ([id number]/[id number] Surgical Hx: The patient  has a past surgical history that includes Coronary angioplasty with stent; Hip surgery; and Cardiac catheterization (N/A, 11/18/2015).   Current Medications: Current Meds  Medication Sig  . aspirin EC 81 MG tablet Take 1 tablet (81 mg total) by mouth daily.  . Marland Kitchenesloratadine (CLARINEX) 5 MG tablet Take 5 mg by mouth daily.  . mesalamine (ASACOL) 400 MG EC tablet Take 1,600 mg by mouth 2 (two) times daily.   . metoprolol tartrate (LOPRESSOR) 25 MG tablet Take 0.5 tablets (12.5 mg total) by mouth daily.  . Marland KitchenITIGARE 0.6 MG CAPS Take 0.6 mg by mouth daily. Changed to Brand name to see if insurance will cover [place]. Multiple Vitamins-Minerals (CENTRUM SILVER PO) Take 1 tablet by mouth daily.   . nitroGLYCERIN (NITROSTAT) 0.4 MG SL tablet Place 1 tablet (0.4 mg total) under the tongue every 5 (five) minutes x 3 doses as needed for chest pain.  . Omega-3 Fatty Acids (FISH OIL) 1200 MG  CAPS Take 1 capsule by mouth 2 (two) times daily. Take 1 tablet by mouth twice a day  . simvastatin (ZOCOR) 40 MG tablet Take 1 tablet (40 mg total) by mouth daily.     Allergies:   Prednisone   Social History   Tobacco Use  . Smoking status: Never Smoker  . Smokeless tobacco: Never Used  Substance Use Topics  . Alcohol use: Yes    Alcohol/week: 0.0 standard drinks    Comment: 1-2 drinks  per week.  . Drug use: No     Family Hx: The patient's family history includes CAD in Tyler Estrada father; Heart murmur in Tyler Estrada sister; Hypertension in Tyler Estrada brother and mother; Lymphoma in Tyler Estrada mother.  ROS:   Please see the history of present illness.    Review of Systems  Musculoskeletal: Positive for joint pain and joint swelling.   All other systems reviewed and are negative.   EKGs/Labs/Other Test Reviewed:    EKG:  EKG is  ordered today.  The ekg ordered today demonstrates normal sinus rhythm, heart rate 68, normal axis, QTC 408, similar to old EKG  Recent Labs: 12/29/2017: BUN 14; Creatinine, Ser 0.80; Hemoglobin 14.0; Platelets 295; Potassium 4.6; Sodium 141   Recent Lipid Panel Lab Results  Component Value Date/Time   CHOL 138 12/29/2017 10:11 AM   TRIG 72 12/29/2017 10:11 AM   HDL 53 12/29/2017 10:11 AM   CHOLHDL 2.6 12/29/2017 10:11 AM   CHOLHDL 2.8 10/07/2016 08:57 AM   LDLCALC 71 12/29/2017 10:11 AM    Physical Exam:    VS:  BP (!) 142/84   Pulse 68   Ht 5' 4"  (1.626 m)   Wt 185 lb (83.9 kg)   SpO2 99%   BMI 31.76 kg/m     Wt Readings from Last 3 Encounters:  10/04/18 185 lb (83.9 kg)  12/29/17 177 lb 3.2 oz (80.4 kg)  10/07/16 191 lb 3.2 oz (86.7 kg)     Physical Exam  Constitutional: Tyler Estrada is oriented to person, place, and time. Tyler Estrada appears well-developed and well-nourished. No distress.  HENT:  Head: Normocephalic and atraumatic.  Eyes: No scleral icterus.  Neck: No JVD present. Carotid bruit is not present. No thyromegaly present.  Cardiovascular: Normal rate and regular rhythm.  No murmur heard. Pulmonary/Chest: Effort normal. Tyler Estrada has no rales.  Abdominal: Soft.  Musculoskeletal: Tyler Estrada exhibits no edema.  Lymphadenopathy:    Tyler Estrada has no cervical adenopathy.  Neurological: Tyler Estrada is alert and oriented to person, place, and time.  Skin: Skin is warm and dry.  Psychiatric: Tyler Estrada has a normal mood and affect.    ASSESSMENT & PLAN:    Preoperative cardiovascular  examination  The Revised Cardiac Risk Index indicates that Tyler Estrada Perioperative Risk of Major Cardiac Event is (%): 0.9.  Therefore, Tyler Estrada is at low risk for perioperative complications.  Tyler Estrada Functional Capacity in METs is: 4.31 as indicated by the Duke Activity Status Index (DASI).  According to ACC/AHA guidelines, no further cardiovascular testing needed.  The patient may proceed to surgery at acceptable risk.   Ideally, Tyler Estrada should remain on ASA without interruption.  However, if the bleeding risk is too great, Tyler Estrada may hold Tyler Estrada ASA for 7 days prior to Tyler Estrada procedure and resume it post op when felt to be safe.     Coronary artery disease involving native coronary artery of native heart without angina pectoris History of stenting to the RCA in 2008.  Cardiac catheterization in 2016 demonstrated patent RCA stents  and moderate nonobstructive disease in the LAD.  Tyler Estrada denies anginal symptoms.  Continue aspirin, statin.  Essential hypertension Blood pressure somewhat above target.  It has usually been well controlled.  Continue to monitor for now.  Hyperlipidemia, unspecified hyperlipidemia type   LDL optimal on most recent lab work.  Continue current Rx.     Dispo:  Return in about 4 months (around 02/04/2019) for Routine Follow Up, w/ Dr. Harrington Challenger.   Medication Adjustments/Labs and Tests Ordered: Current medicines are reviewed at length with the patient today.  Concerns regarding medicines are outlined above.  Tests Ordered: Orders Placed This Encounter  Procedures  . Comp Met (CMET)  . HgB A1c  . Urinalysis  . EKG 12-Lead   Medication Changes: No orders of the defined types were placed in this encounter.   Signed, Richardson Dopp, PA-C  10/04/2018 12:52 PM    [facility] [address], [place], [place]  [address] Phone: [phone]; Fax: [phone]

## 2018-10-05 ENCOUNTER — Telehealth: Payer: Self-pay

## 2018-10-05 LAB — URINALYSIS
BILIRUBIN UA: NEGATIVE
GLUCOSE, UA: NEGATIVE
Ketones, UA: NEGATIVE
Leukocytes, UA: NEGATIVE
Nitrite, UA: NEGATIVE
PROTEIN UA: NEGATIVE
RBC, UA: NEGATIVE
Specific Gravity, UA: 1.008 (ref 1.005–1.030)
Urobilinogen, Ur: 0.2 mg/dL (ref 0.2–1.0)
pH, UA: 5 (ref 5.0–7.5)

## 2018-10-05 LAB — COMPREHENSIVE METABOLIC PANEL
ALBUMIN: 4.7 g/dL (ref 3.6–4.8)
ALK PHOS: 82 IU/L (ref 39–117)
ALT: 24 IU/L (ref 0–44)
AST: 25 IU/L (ref 0–40)
Albumin/Globulin Ratio: 2.2 (ref 1.2–2.2)
BILIRUBIN TOTAL: 0.5 mg/dL (ref 0.0–1.2)
BUN / CREAT RATIO: 14 (ref 10–24)
BUN: 11 mg/dL (ref 8–27)
CHLORIDE: 105 mmol/L (ref 96–106)
CO2: 21 mmol/L (ref 20–29)
Calcium: 9.3 mg/dL (ref 8.6–10.2)
Creatinine, Ser: 0.8 mg/dL (ref 0.76–1.27)
GFR calc Af Amer: 111 mL/min/{1.73_m2} (ref >59)
GFR calc non Af Amer: 96 mL/min/{1.73_m2} (ref >59)
GLUCOSE: 99 mg/dL (ref 65–99)
Globulin, Total: 2.1 g/dL (ref 1.5–4.5)
Potassium: 4.6 mmol/L (ref 3.5–5.2)
Sodium: 144 mmol/L (ref 134–144)
Total Protein: 6.8 g/dL (ref 6.0–8.5)

## 2018-10-05 LAB — HEMOGLOBIN A1C
Est. average glucose Bld gHb Est-mCnc: 114 mg/dL
HEMOGLOBIN A1C: 5.6 % (ref 4.8–5.6)

## 2018-10-05 NOTE — Telephone Encounter (Signed)
-----   Message from Liliane Shi, [place] sent at 10/05/2018  4:29 PM EDT ----- Urinalysis, renal function, potassium, LFTs, hemoglobin A1c normal. Recommendations:  -Please send copy to his orthopedic surgeon, Dr. Alvan Dame, who requested these labs with his preoperative cardiovascular risk assessment exam. Richardson Dopp, PA-C    10/05/2018 4:28 PM

## 2018-10-05 NOTE — Telephone Encounter (Signed)
Notes recorded by Frederik Schmidt, RN on 10/05/2018 at 4:34 PM EDT The patient has been notified of the result and verbalized understanding. All questions (if any) were answered. Frederik Schmidt, RN 10/05/2018 4:34 PM

## 2018-10-06 DIAGNOSIS — K5100 Ulcerative (chronic) pancolitis without complications: Secondary | ICD-10-CM | POA: Diagnosis not present

## 2018-10-06 DIAGNOSIS — K5289 Other specified noninfective gastroenteritis and colitis: Secondary | ICD-10-CM | POA: Diagnosis not present

## 2018-10-06 DIAGNOSIS — K5140 Inflammatory polyps of colon without complications: Secondary | ICD-10-CM | POA: Diagnosis not present

## 2018-10-06 NOTE — Progress Notes (Signed)
Pt has been advised he will need to hold ASA 7 days prior to surgery. Pt thanked me for the call.

## 2018-10-19 ENCOUNTER — Ambulatory Visit: Payer: 59 | Admitting: Family Medicine

## 2018-10-23 ENCOUNTER — Ambulatory Visit: Payer: 59 | Admitting: Physician Assistant

## 2018-11-03 DIAGNOSIS — M069 Rheumatoid arthritis, unspecified: Secondary | ICD-10-CM | POA: Diagnosis not present

## 2018-11-03 DIAGNOSIS — D72820 Lymphocytosis (symptomatic): Secondary | ICD-10-CM | POA: Diagnosis not present

## 2018-11-13 NOTE — H&P (Signed)
UNICOMPARTMENTAL KNEE ADMISSION H&P  Patient is being admitted for left medial unicompartmental knee arthroplasty.  Subjective:  Chief Complaint:  Left knee medial compartmental primary OA /pain    HPI: Tyler Estrada, 62 y.o. male , has a history of pain and functional disability in the left and has failed non-surgical conservative treatments for greater than 12 weeks to include NSAID's and/or analgesics, corticosteriod injections and activity modification.  Onset of symptoms was gradual, starting  years ago with gradually worsening course since that time. The patient noted no past surgery on the left knee(s).  Patient currently rates pain in the left knee(s) at 9 out of 10 with activity. Patient has night pain, worsening of pain with activity and weight bearing, pain that interferes with activities of daily living, pain with passive range of motion, crepitus and joint swelling.  Patient has evidence of periarticular osteophytes and joint space narrowing of the medial compartment by imaging studies.  There is no active infection.  Risks, benefits and expectations were discussed with the patient.  Risks including but not limited to the risk of anesthesia, blood clots, nerve damage, blood vessel damage, failure of the prosthesis, infection and up to and including death.  Patient understand the risks, benefits and expectations and wishes to proceed with surgery.   PCP: Eloise Levels, NP  D/C Plans:       Home   Post-op Meds:       No Rx given   Tranexamic Acid:      To be given - IV   Decadron:      Is to be given  FYI:      ASA  Norco  DME:   Pt already has equipment  PT:   OPPT    Past Medical History:  Diagnosis Date  . Coronary artery disease    a. s/p DES to RCA in 2008;  b. 10/2015 Cath: LM nl, LAD 50ost/p, OM1/2 min irregs, RCA 2mISR, 380m ISR, EF 65%.  . Echocardiogram    Echo 11/16: EF 60-65, normal wall motion, grade 1 diastolic dysfunction  . Hyperlipidemia     . Iron deficiency anemia due to chronic blood loss 09/10/2015  . Pericarditis    a. 10/2015->colchicine.  . Marland Kitchenlcerative colitis with complication ([id number]/2/[id number]   Past Surgical History:  Procedure Laterality Date  . CARDIAC CATHETERIZATION N/A 11/18/2015   Procedure: Left Heart Cath and Coronary Angiography;  Surgeon: MiSherren MochaMD;  [facility];  Service: Cardiovascular;  Laterality: N/A;  . CORONARY ANGIOPLASTY WITH STENT PLACEMENT     a. Cordis stent to RCA in 2008 2.7573m 58m14m HIP SURGERY      Allergies  Allergen Reactions  . Prednisone     Blurred vision     Social History   Tobacco Use  . Smoking status: Never Smoker  . Smokeless tobacco: Never Used  Substance Use Topics  . Alcohol use: Yes    Alcohol/week: 0.0 standard drinks    Comment: 1-2 drinks per week.  . Drug use: No    Family History  Problem Relation Age of Onset  . Lymphoma Mother   . Hypertension Mother   . CAD Father   . Hypertension Brother   . Heart murmur Sister        "as a small child, resolved on its own"     Review of Systems  Constitutional: Negative.   HENT: Negative.   Eyes: Negative.  Respiratory: Negative.   Cardiovascular: Negative.   Gastrointestinal: Negative.   Genitourinary: Negative.   Musculoskeletal: Positive for joint pain.  Skin: Negative.   Neurological: Negative.   Endo/Heme/Allergies: Negative.   Psychiatric/Behavioral: Negative.      Objective:   Physical Exam  Constitutional: He is oriented to person, place, and time. He appears well-developed.  HENT:  Head: Normocephalic.  Eyes: Pupils are equal, round, and reactive to light.  Neck: Neck supple. No JVD present. No tracheal deviation present. No thyromegaly present.  Cardiovascular: Normal rate, regular rhythm and intact distal pulses.  Respiratory: Effort normal and breath sounds normal. No respiratory distress. He has no wheezes.  GI: Soft. There is no tenderness.  There is no guarding.  Musculoskeletal:       Left knee: He exhibits decreased range of motion, swelling and bony tenderness. He exhibits no ecchymosis, no deformity, no laceration and no erythema. Tenderness found. Medial joint line tenderness noted.  Lymphadenopathy:    He has no cervical adenopathy.  Neurological: He is alert and oriented to person, place, and time.  Skin: Skin is warm and dry.  Psychiatric: He has a normal mood and affect.      Labs:  Estimated body mass index is 31.76 kg/m as calculated from the following:   Height as of 10/04/18: 5' 4"  (1.626 m).   Weight as of 10/04/18: 83.9 kg.   Imaging Review Plain radiographs demonstrate severe degenerative joint disease of the left knee(s) medial compartment. The bone quality appears to be good for age and reported activity level.  Assessment/Plan:  End stage arthritis, left knee medial compartment  The patient history, physical examination, clinical judgment of the provider and imaging studies are consistent with end stage degenerative joint disease of the left knee(s) and medial unicompartmental knee arthroplasty is deemed medically necessary. The treatment options including medical management, injection therapy arthroscopy and arthroplasty were discussed at length. The risks and benefits of total knee arthroplasty were presented and reviewed. The risks due to aseptic loosening, infection, stiffness, patella tracking problems, thromboembolic complications and other imponderables were discussed. The patient acknowledged the explanation, agreed to proceed with the plan and consent was signed. Patient is being admitted for outpatient / observation treatment for surgery, pain control, PT, OT, prophylactic antibiotics, VTE prophylaxis, progressive ambulation and ADL's and discharge planning. The patient is planning to be discharged home.     [place] Pugh Marv Alfrey   PA-C  11/13/2018, 8:21 AM.

## 2018-11-14 ENCOUNTER — Encounter (HOSPITAL_COMMUNITY): Payer: Self-pay

## 2018-11-14 NOTE — Pre-Procedure Instructions (Signed)
The following are in epic: Cardiac clearance and Last office visit Dr. Harrington Challenger 10/04/2018 EKG 10/04/2018 Hgb A1C (5.6) 10/04/2018

## 2018-11-14 NOTE — Patient Instructions (Addendum)
Your procedure is scheduled on: Thursday, Dec. 12, 2019   Surgery Time: 7:15AM-8:30AM   Report to [place]  Entrance    Report to admitting at 5:30 AM   Call this number if you have problems the morning of surgery [phone]   Do not eat food or drink liquids :After Midnight.   Brush your teeth the morning of surgery.   Do NOT smoke after Midnight   Take these medicines the morning of surgery with A SIP OF WATER: Clarinex, Mesalamine, Metoprolol,                                You may not have any metal on your body including jewelry, and body piercings             Do not wear lotions, powders, perfumes/cologne, or deodorant                           Men may shave face and neck.   Do not bring valuables to the hospital. [place].   Contacts, dentures or bridgework may not be worn into surgery.   Leave suitcase in the car. After surgery it may be brought to your room.    Special Instructions: Bring a copy of your healthcare power of attorney and living will documents         the day of surgery if you haven't scanned them in before.              Please read over the following fact sheets you were given:  [facility] - Preparing for Surgery Before surgery, you can play an important role.  Because skin is not sterile, your skin needs to be as free of germs as possible.  You can reduce the number of germs on your skin by washing with CHG (chlorahexidine gluconate) soap before surgery.  CHG is an antiseptic cleaner which kills germs and bonds with the skin to continue killing germs even after washing. Please DO NOT use if you have an allergy to CHG or antibacterial soaps.  If your skin becomes reddened/irritated stop using the CHG and inform your nurse when you arrive at Short Stay. Do not shave (including legs and underarms) for at least 48 hours prior to the first CHG shower.  You may shave your  face/neck.  Please follow these instructions carefully:  1.  Shower with CHG Soap the night before surgery and the  morning of surgery.  2.  If you choose to wash your hair, wash your hair first as usual with your normal  shampoo.  3.  After you shampoo, rinse your hair and body thoroughly to remove the shampoo.                             4.  Use CHG as you would any other liquid soap.  You can apply chg directly to the skin and wash.  Gently with a scrungie or clean washcloth.  5.  Apply the CHG Soap to your body ONLY FROM THE NECK DOWN.   Do   not use on face/ open  Wound or open sores. Avoid contact with eyes, ears mouth and   genitals (private parts).                       Wash face,  Genitals (private parts) with your normal soap.             6.  Wash thoroughly, paying special attention to the area where your    surgery  will be performed.  7.  Thoroughly rinse your body with warm water from the neck down.  8.  DO NOT shower/wash with your normal soap after using and rinsing off the CHG Soap.                9.  Pat yourself dry with a clean towel.            10.  Wear clean pajamas.            11.  Place clean sheets on your bed the night of your first shower and do not  sleep with pets. Day of Surgery : Do not apply any lotions/deodorants the morning of surgery.  Please wear clean clothes to the hospital/surgery center.  FAILURE TO FOLLOW THESE INSTRUCTIONS MAY RESULT IN THE CANCELLATION OF YOUR SURGERY  PATIENT SIGNATURE_________________________________  NURSE SIGNATURE__________________________________  ________________________________________________________________________   Adam Phenix  An incentive spirometer is a tool that can help keep your lungs clear and active. This tool measures how well you are filling your lungs with each breath. Taking long deep breaths may help reverse or decrease the chance of developing breathing (pulmonary)  problems (especially infection) following:  A long period of time when you are unable to move or be active. BEFORE THE PROCEDURE   If the spirometer includes an indicator to show your best effort, your nurse or respiratory therapist will set it to a desired goal.  If possible, sit up straight or lean slightly forward. Try not to slouch.  Hold the incentive spirometer in an upright position. INSTRUCTIONS FOR USE  1. Sit on the edge of your bed if possible, or sit up as far as you can in bed or on a chair. 2. Hold the incentive spirometer in an upright position. 3. Breathe out normally. 4. Place the mouthpiece in your mouth and seal your lips tightly around it. 5. Breathe in slowly and as deeply as possible, raising the piston or the ball toward the top of the column. 6. Hold your breath for 3-5 seconds or for as long as possible. Allow the piston or ball to fall to the bottom of the column. 7. Remove the mouthpiece from your mouth and breathe out normally. 8. Rest for a few seconds and repeat Steps 1 through 7 at least 10 times every 1-2 hours when you are awake. Take your time and take a few normal breaths between deep breaths. 9. The spirometer may include an indicator to show your best effort. Use the indicator as a goal to work toward during each repetition. 10. After each set of 10 deep breaths, practice coughing to be sure your lungs are clear. If you have an incision (the cut made at the time of surgery), support your incision when coughing by placing a pillow or rolled up towels firmly against it. Once you are able to get out of bed, walk around indoors and cough well. You may stop using the incentive spirometer when instructed by your caregiver.  RISKS AND COMPLICATIONS  Take your time  so you do not get dizzy or light-headed.  If you are in pain, you may need to take or ask for pain medication before doing incentive spirometry. It is harder to take a deep breath if you are having  pain. AFTER USE  Rest and breathe slowly and easily.  It can be helpful to keep track of a log of your progress. Your caregiver can provide you with a simple table to help with this. If you are using the spirometer at home, follow these instructions: [place] IF:   You are having difficultly using the spirometer.  You have trouble using the spirometer as often as instructed.  Your pain medication is not giving enough relief while using the spirometer.  You develop fever of 100.5 F (38.1 C) or higher. SEEK IMMEDIATE MEDICAL CARE IF:   You cough up bloody sputum that had not been present before.  You develop fever of 102 F (38.9 C) or greater.  You develop worsening pain at or near the incision site. MAKE SURE YOU:   Understand these instructions.  Will watch your condition.  Will get help right away if you are not doing well or get worse. Document Released: 04/18/2007 Document Revised: 02/28/2012 Document Reviewed: 06/19/2007 ExitCare Patient Information 2014 Exit[employer], [place].   ________________________________________________________________________  WHAT IS A BLOOD TRANSFUSION? Blood Transfusion Information  A transfusion is the replacement of blood or some of its parts. Blood is made up of multiple cells which provide different functions.  Red blood cells carry oxygen and are used for blood loss replacement.  White blood cells fight against infection.  Platelets control bleeding.  Plasma helps clot blood.  Other blood products are available for specialized needs, such as hemophilia or other clotting disorders. BEFORE THE TRANSFUSION  Who gives blood for transfusions?   Healthy volunteers who are fully evaluated to make sure their blood is safe. This is blood bank blood. Transfusion therapy is the safest it has ever been in the practice of medicine. Before blood is taken from a donor, a complete history is taken to make sure that person has no history  of diseases nor engages in risky social behavior (examples are intravenous drug use or sexual activity with multiple partners). The donor's travel history is screened to minimize risk of transmitting infections, such as malaria. The donated blood is tested for signs of infectious diseases, such as HIV and hepatitis. The blood is then tested to be sure it is compatible with you in order to minimize the chance of a transfusion reaction. If you or a relative donates blood, this is often done in anticipation of surgery and is not appropriate for emergency situations. It takes many days to process the donated blood. RISKS AND COMPLICATIONS Although transfusion therapy is very safe and saves many lives, the main dangers of transfusion include:   Getting an infectious disease.  Developing a transfusion reaction. This is an allergic reaction to something in the blood you were given. Every precaution is taken to prevent this. The decision to have a blood transfusion has been considered carefully by your caregiver before blood is given. Blood is not given unless the benefits outweigh the risks. AFTER THE TRANSFUSION  Right after receiving a blood transfusion, you will usually feel much better and more energetic. This is especially true if your red blood cells have gotten low (anemic). The transfusion raises the level of the red blood cells which carry oxygen, and this usually causes an energy increase.  The  nurse administering the transfusion will monitor you carefully for complications. HOME CARE INSTRUCTIONS  No special instructions are needed after a transfusion. You may find your energy is better. Speak with your caregiver about any limitations on activity for underlying diseases you may have. SEEK MEDICAL CARE IF:   Your condition is not improving after your transfusion.  You develop redness or irritation at the intravenous (IV) site. SEEK IMMEDIATE MEDICAL CARE IF:  Any of the following symptoms  occur over the next 12 hours:  Shaking chills.  You have a temperature by mouth above 102 F (38.9 C), not controlled by medicine.  Chest, back, or muscle pain.  People around you feel you are not acting correctly or are confused.  Shortness of breath or difficulty breathing.  Dizziness and fainting.  You get a rash or develop hives.  You have a decrease in urine output.  Your urine turns a dark color or changes to pink, red, or brown. Any of the following symptoms occur over the next 10 days:  You have a temperature by mouth above 102 F (38.9 C), not controlled by medicine.  Shortness of breath.  Weakness after normal activity.  The white part of the eye turns yellow (jaundice).  You have a decrease in the amount of urine or are urinating less often.  Your urine turns a dark color or changes to pink, red, or brown. Document Released: 12/03/2000 Document Revised: 02/28/2012 Document Reviewed: 07/22/2008 [facility] Patient Information 2014 [place], [place].  _______________________________________________________________________

## 2018-11-15 ENCOUNTER — Other Ambulatory Visit: Payer: Self-pay

## 2018-11-15 ENCOUNTER — Encounter (HOSPITAL_COMMUNITY): Payer: Self-pay

## 2018-11-15 ENCOUNTER — Encounter (HOSPITAL_COMMUNITY)
Admission: RE | Admit: 2018-11-15 | Discharge: 2018-11-15 | Disposition: A | Discharge status: Home / Self Care | Payer: 59 | Source: Ambulatory Visit | Attending: Orthopedic Surgery | Admitting: Orthopedic Surgery

## 2018-11-15 DIAGNOSIS — M1712 Unilateral primary osteoarthritis, left knee: Secondary | ICD-10-CM | POA: Diagnosis not present

## 2018-11-15 DIAGNOSIS — Z01812 Encounter for preprocedural laboratory examination: Secondary | ICD-10-CM | POA: Insufficient documentation

## 2018-11-15 HISTORY — DX: Malignant melanoma of skin, unspecified: C43.9

## 2018-11-15 HISTORY — DX: Adverse effect of unspecified anesthetic, initial encounter: T41.45XA

## 2018-11-15 HISTORY — DX: Other complications of anesthesia, initial encounter: T88.59XA

## 2018-11-15 HISTORY — DX: Unspecified osteoarthritis, unspecified site: M19.90

## 2018-11-15 HISTORY — DX: Heart disease, unspecified: I51.9

## 2018-11-15 LAB — CBC
HEMATOCRIT: 40.5 % (ref 39.0–52.0)
HEMOGLOBIN: 13.1 g/dL (ref 13.0–17.0)
MCH: 29.6 pg (ref 26.0–34.0)
MCHC: 32.3 g/dL (ref 30.0–36.0)
MCV: 91.4 fL (ref 80.0–100.0)
Platelets: 251 10*3/uL (ref 150–400)
RBC: 4.43 MIL/uL (ref 4.22–5.81)
RDW: 13.1 % (ref 11.5–15.5)
WBC: 6.8 10*3/uL (ref 4.0–10.5)
nRBC: 0 % (ref 0.0–0.2)

## 2018-11-15 LAB — BASIC METABOLIC PANEL
Anion gap: 5 (ref 5–15)
BUN: 15 mg/dL (ref 8–23)
CO2: 28 mmol/L (ref 22–32)
Calcium: 9.1 mg/dL (ref 8.9–10.3)
Chloride: 109 mmol/L (ref 98–111)
Creatinine, Ser: 0.93 mg/dL (ref 0.61–1.24)
GFR calc Af Amer: >60 mL/min (ref >60)
GFR calc non Af Amer: >60 mL/min (ref >60)
Glucose, Bld: 103 mg/dL — ABNORMAL HIGH (ref 70–99)
Potassium: 3.8 mmol/L (ref 3.5–5.1)
Sodium: 142 mmol/L (ref 135–145)

## 2018-11-15 LAB — SURGICAL PCR SCREEN
MRSA, PCR: NEGATIVE
STAPHYLOCOCCUS AUREUS: NEGATIVE

## 2018-11-15 NOTE — Pre-Procedure Instructions (Signed)
BMP results 11/15/2018 sen to Dr. Alvan Dame via epic.

## 2018-11-29 NOTE — Anesthesia Preprocedure Evaluation (Addendum)
Anesthesia Evaluation  Patient identified by MRN, date of birth, ID band Patient awake    Reviewed: Allergy & Precautions, NPO status , Patient's Chart, lab work & pertinent test results, reviewed documented beta blocker date and time   Airway Mallampati: II  TM Distance: >3 FB Neck ROM: Full    Dental no notable dental hx. (+) Teeth Intact, Dental Advisory Given   Pulmonary neg pulmonary ROS,    Pulmonary exam normal breath sounds clear to auscultation       Cardiovascular + CAD and + Cardiac Stents (2008, 2016)  Normal cardiovascular exam Rhythm:Regular Rate:Normal  TTE 2016 - Left ventricle: The cavity size was normal. Wall thickness was normal. Systolic function was normal. The estimated ejection fraction was in the range of 60% to 65%. Wall motion was normal; there were no regional wall motion abnormalities. Doppler parameters are consistent with abnormal left ventricular relaxation (grade 1 diastolic dysfunction).  LHC 2016 1. Patent stents in the RCA with mild in-stent restenosis 2. Moderate proximal LAD stenosis, do not suspect 'flow-limiting' lesion 3. Widely patent and large left circumflex without stenosis 4. Vigorous LV systolic function with normal LVEDP   Neuro/Psych negative neurological ROS  negative psych ROS   GI/Hepatic negative GI ROS, Neg liver ROS, UC   Endo/Other  negative endocrine ROS  Renal/GU negative Renal ROS  negative genitourinary   Musculoskeletal  (+) Arthritis , Osteoarthritis,    Abdominal   Peds  Hematology  (+) Blood dyscrasia, anemia ,   Anesthesia Other Findings   Reproductive/Obstetrics                            Anesthesia Physical Anesthesia Plan  ASA: III  Anesthesia Plan: Regional and Spinal   Post-op Pain Management:    Induction: Intravenous  PONV Risk Score and Plan: 1 and Treatment may vary due to age or medical condition  Airway  Management Planned: Natural Airway  Additional Equipment:   Intra-op Plan:   Post-operative Plan:   Informed Consent: I have reviewed the patients History and Physical, chart, labs and discussed the procedure including the risks, benefits and alternatives for the proposed anesthesia with the patient or authorized representative who has indicated his/her understanding and acceptance.   Dental advisory given  Plan Discussed with: CRNA  Anesthesia Plan Comments:         Anesthesia Quick Evaluation

## 2018-11-30 ENCOUNTER — Encounter (HOSPITAL_COMMUNITY)
Admission: RE | Disposition: A | Discharge status: Home / Self Care | Payer: Self-pay | Source: Ambulatory Visit | Attending: Orthopedic Surgery

## 2018-11-30 ENCOUNTER — Ambulatory Visit (HOSPITAL_COMMUNITY): Payer: 59 | Admitting: Certified Registered"

## 2018-11-30 ENCOUNTER — Other Ambulatory Visit: Payer: Self-pay

## 2018-11-30 ENCOUNTER — Encounter (HOSPITAL_COMMUNITY): Payer: Self-pay

## 2018-11-30 ENCOUNTER — Observation Stay (HOSPITAL_COMMUNITY)
Admission: RE | Admit: 2018-11-30 | Discharge: 2018-12-01 | Disposition: A | Discharge status: Home / Self Care | Payer: 59 | Source: Ambulatory Visit | Attending: Orthopedic Surgery | Admitting: Orthopedic Surgery

## 2018-11-30 DIAGNOSIS — M1711 Unilateral primary osteoarthritis, right knee: Secondary | ICD-10-CM | POA: Diagnosis not present

## 2018-11-30 DIAGNOSIS — E669 Obesity, unspecified: Secondary | ICD-10-CM | POA: Diagnosis present

## 2018-11-30 DIAGNOSIS — I2510 Atherosclerotic heart disease of native coronary artery without angina pectoris: Secondary | ICD-10-CM | POA: Diagnosis not present

## 2018-11-30 DIAGNOSIS — Z955 Presence of coronary angioplasty implant and graft: Secondary | ICD-10-CM | POA: Diagnosis not present

## 2018-11-30 DIAGNOSIS — M25761 Osteophyte, right knee: Secondary | ICD-10-CM | POA: Insufficient documentation

## 2018-11-30 DIAGNOSIS — Z96652 Presence of left artificial knee joint: Secondary | ICD-10-CM

## 2018-11-30 DIAGNOSIS — Z85820 Personal history of malignant melanoma of skin: Secondary | ICD-10-CM | POA: Insufficient documentation

## 2018-11-30 DIAGNOSIS — G8918 Other acute postprocedural pain: Secondary | ICD-10-CM | POA: Diagnosis not present

## 2018-11-30 DIAGNOSIS — M1712 Unilateral primary osteoarthritis, left knee: Principal | ICD-10-CM | POA: Insufficient documentation

## 2018-11-30 DIAGNOSIS — E785 Hyperlipidemia, unspecified: Secondary | ICD-10-CM | POA: Diagnosis not present

## 2018-11-30 HISTORY — PX: PARTIAL KNEE ARTHROPLASTY: SHX2174

## 2018-11-30 LAB — TYPE AND SCREEN
ABO/RH(D): B POS
Antibody Screen: NEGATIVE

## 2018-11-30 LAB — ABO/RH: ABO/RH(D): B POS

## 2018-11-30 SURGERY — ARTHROPLASTY, KNEE, UNICOMPARTMENTAL
Anesthesia: Regional | Site: Knee | Laterality: Left

## 2018-11-30 MED ORDER — LACTATED RINGERS IV SOLN
INTRAVENOUS | Status: DC
  Administered 2018-11-30: 1000 mL via INTRAVENOUS

## 2018-11-30 MED ORDER — ACETAMINOPHEN 325 MG PO TABS: 325.0000 mg | ORAL_TABLET | Freq: Four times a day (QID) | ORAL | Status: DC | PRN

## 2018-11-30 MED ORDER — PROPOFOL 10 MG/ML IV BOLUS
INTRAVENOUS | Status: AC
  Filled 2018-11-30: qty 20

## 2018-11-30 MED ORDER — BISACODYL 10 MG RE SUPP: 10.0000 mg | Freq: Every day | RECTAL | Status: DC | PRN

## 2018-11-30 MED ORDER — ASPIRIN 81 MG PO CHEW: 81.0000 mg | CHEWABLE_TABLET | Freq: Two times a day (BID) | ORAL | 0 refills | Status: AC

## 2018-11-30 MED ORDER — DIPHENHYDRAMINE HCL 12.5 MG/5ML PO ELIX: 12.5000 mg | ORAL_SOLUTION | ORAL | Status: DC | PRN

## 2018-11-30 MED ORDER — MENTHOL 3 MG MT LOZG: 1.0000 | LOZENGE | OROMUCOSAL | Status: DC | PRN

## 2018-11-30 MED ORDER — HYDROCODONE-ACETAMINOPHEN 5-325 MG PO TABS
ORAL_TABLET | ORAL | Status: AC
  Administered 2018-12-01: 2 via ORAL
  Filled 2018-11-30: qty 2

## 2018-11-30 MED ORDER — KETOROLAC TROMETHAMINE 30 MG/ML IJ SOLN
INTRAMUSCULAR | Status: DC | PRN
  Administered 2018-11-30: 30 mg

## 2018-11-30 MED ORDER — LORATADINE 10 MG PO TABS
10.0000 mg | ORAL_TABLET | Freq: Every day | ORAL | Status: DC
  Administered 2018-11-30 – 2018-12-01 (×2): 10 mg via ORAL
  Filled 2018-11-30 (×2): qty 1

## 2018-11-30 MED ORDER — MIDAZOLAM HCL 2 MG/2ML IJ SOLN
INTRAMUSCULAR | Status: AC
  Filled 2018-11-30: qty 2

## 2018-11-30 MED ORDER — TRANEXAMIC ACID-NACL 1000-0.7 MG/100ML-% IV SOLN: 1000.0000 mg | Freq: Once | INTRAVENOUS | Status: DC

## 2018-11-30 MED ORDER — PROPOFOL 10 MG/ML IV BOLUS
INTRAVENOUS | Status: DC | PRN
  Administered 2018-11-30 (×2): 20 mg via INTRAVENOUS

## 2018-11-30 MED ORDER — PROPOFOL 500 MG/50ML IV EMUL
INTRAVENOUS | Status: DC | PRN
  Administered 2018-11-30: 125 ug/kg/min via INTRAVENOUS

## 2018-11-30 MED ORDER — CHLORHEXIDINE GLUCONATE 4 % EX LIQD: 60.0000 mL | Freq: Once | CUTANEOUS | Status: DC

## 2018-11-30 MED ORDER — METHOCARBAMOL 500 MG PO TABS: 500.0000 mg | ORAL_TABLET | Freq: Four times a day (QID) | ORAL | 0 refills | Status: DC | PRN

## 2018-11-30 MED ORDER — ONDANSETRON HCL 4 MG PO TABS: 4.0000 mg | ORAL_TABLET | Freq: Four times a day (QID) | ORAL | Status: DC | PRN

## 2018-11-30 MED ORDER — METHOCARBAMOL 500 MG IVPB - SIMPLE MED
INTRAVENOUS | Status: AC
  Filled 2018-11-30: qty 50

## 2018-11-30 MED ORDER — METOCLOPRAMIDE HCL 5 MG/ML IJ SOLN: 5.0000 mg | Freq: Three times a day (TID) | INTRAMUSCULAR | Status: DC | PRN

## 2018-11-30 MED ORDER — PHENYLEPHRINE 40 MCG/ML (10ML) SYRINGE FOR IV PUSH (FOR BLOOD PRESSURE SUPPORT)
PREFILLED_SYRINGE | INTRAVENOUS | Status: DC | PRN
  Administered 2018-11-30 (×5): 120 ug via INTRAVENOUS

## 2018-11-30 MED ORDER — ONDANSETRON HCL 4 MG/2ML IJ SOLN
INTRAMUSCULAR | Status: DC | PRN
  Administered 2018-11-30: 4 mg via INTRAVENOUS

## 2018-11-30 MED ORDER — CEFAZOLIN SODIUM-DEXTROSE 2-4 GM/100ML-% IV SOLN
2.0000 g | INTRAVENOUS | Status: AC
  Administered 2018-11-30: 2 g via INTRAVENOUS
  Filled 2018-11-30: qty 100

## 2018-11-30 MED ORDER — SODIUM CHLORIDE (PF) 0.9 % IJ SOLN
INTRAMUSCULAR | Status: DC | PRN
  Administered 2018-11-30: 30 mL

## 2018-11-30 MED ORDER — HYDROCODONE-ACETAMINOPHEN 5-325 MG PO TABS
1.0000 | ORAL_TABLET | ORAL | Status: DC | PRN
  Administered 2018-11-30 – 2018-12-01 (×5): 2 via ORAL
  Filled 2018-11-30 (×4): qty 2

## 2018-11-30 MED ORDER — HYDROMORPHONE HCL 1 MG/ML IJ SOLN: 0.5000 mg | INTRAMUSCULAR | Status: DC | PRN

## 2018-11-30 MED ORDER — ROPIVACAINE HCL 7.5 MG/ML IJ SOLN
INTRAMUSCULAR | Status: DC | PRN
  Administered 2018-11-30: 20 mL via PERINEURAL

## 2018-11-30 MED ORDER — FENTANYL CITRATE (PF) 100 MCG/2ML IJ SOLN
25.0000 ug | INTRAMUSCULAR | Status: DC | PRN
  Administered 2018-11-30 (×2): 50 ug via INTRAVENOUS

## 2018-11-30 MED ORDER — SODIUM CHLORIDE 0.9 % IV SOLN
INTRAVENOUS | Status: DC
  Administered 2018-11-30: 14:00:00 via INTRAVENOUS

## 2018-11-30 MED ORDER — SODIUM CHLORIDE (PF) 0.9 % IJ SOLN
INTRAMUSCULAR | Status: AC
  Filled 2018-11-30: qty 30

## 2018-11-30 MED ORDER — CELECOXIB 200 MG PO CAPS
200.0000 mg | ORAL_CAPSULE | Freq: Two times a day (BID) | ORAL | Status: DC
  Filled 2018-11-30 (×2): qty 1

## 2018-11-30 MED ORDER — PHENOL 1.4 % MT LIQD: 1.0000 | OROMUCOSAL | Status: DC | PRN

## 2018-11-30 MED ORDER — METHOCARBAMOL 500 MG PO TABS
500.0000 mg | ORAL_TABLET | Freq: Four times a day (QID) | ORAL | Status: DC | PRN
  Administered 2018-11-30 – 2018-12-01 (×2): 500 mg via ORAL
  Filled 2018-11-30 (×2): qty 1

## 2018-11-30 MED ORDER — POLYETHYLENE GLYCOL 3350 17 G PO PACK
17.0000 g | PACK | Freq: Two times a day (BID) | ORAL | Status: DC
  Filled 2018-11-30: qty 1

## 2018-11-30 MED ORDER — CLONIDINE HCL (ANALGESIA) 100 MCG/ML EP SOLN
EPIDURAL | Status: DC | PRN
  Administered 2018-11-30: 50 ug

## 2018-11-30 MED ORDER — DEXAMETHASONE SODIUM PHOSPHATE 10 MG/ML IJ SOLN
INTRAMUSCULAR | Status: DC | PRN
  Administered 2018-11-30: 8 mg via INTRAVENOUS

## 2018-11-30 MED ORDER — FENTANYL CITRATE (PF) 100 MCG/2ML IJ SOLN
INTRAMUSCULAR | Status: AC
  Filled 2018-11-30: qty 2

## 2018-11-30 MED ORDER — MAGNESIUM CITRATE PO SOLN: 1.0000 | Freq: Once | ORAL | Status: DC | PRN

## 2018-11-30 MED ORDER — METHOCARBAMOL 500 MG IVPB - SIMPLE MED
500.0000 mg | Freq: Four times a day (QID) | INTRAVENOUS | Status: DC | PRN
  Administered 2018-11-30: 500 mg via INTRAVENOUS
  Filled 2018-11-30: qty 50

## 2018-11-30 MED ORDER — HYDROCODONE-ACETAMINOPHEN 7.5-325 MG PO TABS: 1.0000 | ORAL_TABLET | ORAL | 0 refills | Status: DC | PRN

## 2018-11-30 MED ORDER — BUPIVACAINE IN DEXTROSE 0.75-8.25 % IT SOLN
INTRATHECAL | Status: DC | PRN
  Administered 2018-11-30: 1.4 mL via INTRATHECAL

## 2018-11-30 MED ORDER — SIMVASTATIN 20 MG PO TABS
40.0000 mg | ORAL_TABLET | Freq: Every day | ORAL | Status: DC
  Administered 2018-11-30 – 2018-12-01 (×2): 40 mg via ORAL
  Filled 2018-11-30 (×2): qty 2

## 2018-11-30 MED ORDER — METOCLOPRAMIDE HCL 5 MG PO TABS: 5.0000 mg | ORAL_TABLET | Freq: Three times a day (TID) | ORAL | Status: DC | PRN

## 2018-11-30 MED ORDER — BUPIVACAINE-EPINEPHRINE (PF) 0.25% -1:200000 IJ SOLN
INTRAMUSCULAR | Status: AC
  Filled 2018-11-30: qty 30

## 2018-11-30 MED ORDER — FERROUS SULFATE 325 (65 FE) MG PO TABS: 325.0000 mg | ORAL_TABLET | Freq: Three times a day (TID) | ORAL | 3 refills | Status: DC

## 2018-11-30 MED ORDER — DOCUSATE SODIUM 100 MG PO CAPS: 100.0000 mg | ORAL_CAPSULE | Freq: Two times a day (BID) | ORAL | 0 refills | Status: DC

## 2018-11-30 MED ORDER — PHENYLEPHRINE 40 MCG/ML (10ML) SYRINGE FOR IV PUSH (FOR BLOOD PRESSURE SUPPORT)
PREFILLED_SYRINGE | INTRAVENOUS | Status: AC
  Filled 2018-11-30: qty 10

## 2018-11-30 MED ORDER — BUPIVACAINE-EPINEPHRINE 0.25% -1:200000 IJ SOLN
INTRAMUSCULAR | Status: DC | PRN
  Administered 2018-11-30: 30 mL

## 2018-11-30 MED ORDER — TRANEXAMIC ACID-NACL 1000-0.7 MG/100ML-% IV SOLN
1000.0000 mg | INTRAVENOUS | Status: AC
  Administered 2018-11-30: 1000 mg via INTRAVENOUS
  Filled 2018-11-30: qty 100

## 2018-11-30 MED ORDER — CEFAZOLIN SODIUM-DEXTROSE 2-4 GM/100ML-% IV SOLN
2.0000 g | Freq: Four times a day (QID) | INTRAVENOUS | Status: AC
  Administered 2018-11-30 (×2): 2 g via INTRAVENOUS
  Filled 2018-11-30 (×2): qty 100

## 2018-11-30 MED ORDER — STERILE WATER FOR IRRIGATION IR SOLN
Status: DC | PRN
  Administered 2018-11-30: 1000 mL

## 2018-11-30 MED ORDER — ASPIRIN 81 MG PO CHEW
81.0000 mg | CHEWABLE_TABLET | Freq: Two times a day (BID) | ORAL | Status: DC
  Administered 2018-11-30 – 2018-12-01 (×2): 81 mg via ORAL
  Filled 2018-11-30 (×2): qty 1

## 2018-11-30 MED ORDER — METOPROLOL TARTRATE 12.5 MG HALF TABLET
12.5000 mg | ORAL_TABLET | Freq: Every day | ORAL | Status: DC
  Administered 2018-12-01: 12.5 mg via ORAL
  Filled 2018-11-30: qty 1

## 2018-11-30 MED ORDER — POLYETHYLENE GLYCOL 3350 17 G PO PACK: 17.0000 g | PACK | Freq: Two times a day (BID) | ORAL | 0 refills | Status: DC

## 2018-11-30 MED ORDER — KETOROLAC TROMETHAMINE 30 MG/ML IJ SOLN
INTRAMUSCULAR | Status: AC
  Filled 2018-11-30: qty 1

## 2018-11-30 MED ORDER — DOCUSATE SODIUM 100 MG PO CAPS
100.0000 mg | ORAL_CAPSULE | Freq: Two times a day (BID) | ORAL | Status: DC
  Administered 2018-11-30 – 2018-12-01 (×2): 100 mg via ORAL
  Filled 2018-11-30 (×2): qty 1

## 2018-11-30 MED ORDER — FERROUS SULFATE 325 (65 FE) MG PO TABS
325.0000 mg | ORAL_TABLET | Freq: Two times a day (BID) | ORAL | Status: DC
  Administered 2018-11-30 – 2018-12-01 (×2): 325 mg via ORAL
  Filled 2018-11-30 (×2): qty 1

## 2018-11-30 MED ORDER — MIDAZOLAM HCL 2 MG/2ML IJ SOLN
INTRAMUSCULAR | Status: DC | PRN
  Administered 2018-11-30: 2 mg via INTRAVENOUS

## 2018-11-30 MED ORDER — ONDANSETRON HCL 4 MG/2ML IJ SOLN: 4.0000 mg | Freq: Four times a day (QID) | INTRAMUSCULAR | Status: DC | PRN

## 2018-11-30 MED ORDER — FENTANYL CITRATE (PF) 250 MCG/5ML IJ SOLN
INTRAMUSCULAR | Status: DC | PRN
  Administered 2018-11-30: 50 ug via INTRAVENOUS

## 2018-11-30 MED ORDER — ALUM & MAG HYDROXIDE-SIMETH 200-200-20 MG/5ML PO SUSP: 15.0000 mL | ORAL | Status: DC | PRN

## 2018-11-30 MED ORDER — HYDROCODONE-ACETAMINOPHEN 7.5-325 MG PO TABS: 1.0000 | ORAL_TABLET | ORAL | Status: DC | PRN

## 2018-11-30 SURGICAL SUPPLY — 47 items
BAG ZIPLOCK 12X15 (MISCELLANEOUS) IMPLANT
BANDAGE ACE 6X5 VEL STRL LF (GAUZE/BANDAGES/DRESSINGS) ×3 IMPLANT
BANDAGE ELASTIC 6 VELCRO ST LF (GAUZE/BANDAGES/DRESSINGS) ×3 IMPLANT
BEARING MENISCAL TIBIAL 4 SM L (Orthopedic Implant) ×3 IMPLANT
BLADE SAW RECIPROCATING 77.5 (BLADE) ×3 IMPLANT
BLADE SAW SGTL 13.0X1.19X90.0M (BLADE) ×3 IMPLANT
BOWL SMART MIX CTS (DISPOSABLE) ×3 IMPLANT
CEMENT BONE R 1X40 (Cement) ×3 IMPLANT
COVER SURGICAL LIGHT HANDLE (MISCELLANEOUS) ×3 IMPLANT
COVER WAND RF STERILE (DRAPES) ×3 IMPLANT
CUFF TOURN SGL QUICK 34 (TOURNIQUET CUFF) ×2
CUFF TRNQT CYL 34X4X40X1 (TOURNIQUET CUFF) ×1 IMPLANT
DECANTER SPIKE VIAL GLASS SM (MISCELLANEOUS) IMPLANT
DERMABOND ADVANCED (GAUZE/BANDAGES/DRESSINGS) ×2
DERMABOND ADVANCED .7 DNX12 (GAUZE/BANDAGES/DRESSINGS) ×1 IMPLANT
DRAPE U-SHAPE 47X51 STRL (DRAPES) ×3 IMPLANT
DRESSING AQUACEL AG SP 3.5X10 (GAUZE/BANDAGES/DRESSINGS) ×1 IMPLANT
DRSG AQUACEL AG SP 3.5X10 (GAUZE/BANDAGES/DRESSINGS) ×3
DURAPREP 26ML APPLICATOR (WOUND CARE) ×6 IMPLANT
ELECT REM PT RETURN 15FT ADLT (MISCELLANEOUS) ×3 IMPLANT
GLOVE BIOGEL M 7.0 STRL (GLOVE) IMPLANT
GLOVE BIOGEL PI IND STRL 7.5 (GLOVE) ×1 IMPLANT
GLOVE BIOGEL PI IND STRL 8.5 (GLOVE) ×1 IMPLANT
GLOVE BIOGEL PI INDICATOR 7.5 (GLOVE) ×2
GLOVE BIOGEL PI INDICATOR 8.5 (GLOVE) ×2
GLOVE ECLIPSE 8.0 STRL XLNG CF (GLOVE) ×3 IMPLANT
GLOVE ORTHO TXT STRL SZ7.5 (GLOVE) ×3 IMPLANT
GOWN STRL REUS W/TWL 2XL LVL3 (GOWN DISPOSABLE) ×3 IMPLANT
GOWN STRL REUS W/TWL LRG LVL3 (GOWN DISPOSABLE) ×3 IMPLANT
GOWN STRL REUS W/TWL XL LVL3 (GOWN DISPOSABLE) ×3 IMPLANT
HOLDER FOLEY CATH W/STRAP (MISCELLANEOUS) ×3 IMPLANT
INSERT TIBIAL OXFORD SZ B LF (Joint) ×3 IMPLANT
LEGGING LITHOTOMY PAIR STRL (DRAPES) ×3 IMPLANT
MANIFOLD NEPTUNE II (INSTRUMENTS) ×3 IMPLANT
NDL SAFETY ECLIPSE 18X1.5 (NEEDLE) IMPLANT
NEEDLE HYPO 18GX1.5 SHARP (NEEDLE)
PACK TOTAL KNEE CUSTOM (KITS) ×3 IMPLANT
PEG FEMORAL PEGGED STRL SM (Knees) ×3 IMPLANT
SUT MNCRL AB 4-0 PS2 18 (SUTURE) ×3 IMPLANT
SUT STRATAFIX PDS+ 0 24IN (SUTURE) ×3 IMPLANT
SUT VIC AB 1 CT1 36 (SUTURE) ×3 IMPLANT
SUT VIC AB 2-0 CT1 27 (SUTURE) ×4
SUT VIC AB 2-0 CT1 TAPERPNT 27 (SUTURE) ×2 IMPLANT
SYR 3ML LL SCALE MARK (SYRINGE) IMPLANT
TRAY FOLEY MTR SLVR 16FR STAT (SET/KITS/TRAYS/PACK) ×3 IMPLANT
WRAP KNEE MAXI GEL POST OP (GAUZE/BANDAGES/DRESSINGS) ×3 IMPLANT
YANKAUER SUCT BULB TIP NO VENT (SUCTIONS) ×3 IMPLANT

## 2018-11-30 NOTE — Anesthesia Procedure Notes (Signed)
Anesthesia Regional Block: Adductor canal block   Pre-Anesthetic Checklist: ,, timeout performed, Correct Patient, Correct Site, Correct Laterality, Correct Procedure, Correct Position, site marked, Risks and benefits discussed,  Surgical consent,  Pre-op evaluation,  At surgeon's request and post-op pain management  Laterality: Left  Prep: Maximum Sterile Barrier Precautions used, chloraprep       Needles:  Injection technique: Single-shot  Needle Type: Echogenic Stimulator Needle     Needle Length: 9cm  Needle Gauge: 22     Additional Needles:   Procedures:,,,, ultrasound used (permanent image in chart),,,,  Narrative:  Start time: 11/30/2018 7:10 AM End time: 11/30/2018 7:20 AM Injection made incrementally with aspirations every 5 mL.  Performed by: Personally  Anesthesiologist: Freddrick March, MD  Additional Notes: Monitors applied. No increased pain on injection. No increased resistance to injection. Injection made in 5cc increments. Good needle visualization. Patient tolerated procedure well.

## 2018-11-30 NOTE — Plan of Care (Signed)
Plan of care 

## 2018-11-30 NOTE — Interval H&P Note (Signed)
History and Physical Interval Note:  11/30/2018 7:01 AM  Tyler Estrada  has presented today for surgery, with the diagnosis of Left knee osteoarthritis medially  The various methods of treatment have been discussed with the patient and family. After consideration of risks, benefits and other options for treatment, the patient has consented to  Procedure(s) with comments: LEFT UNICOMPARTMENTAL KNEE Medially (Left) - 70 mins as a surgical intervention .  The patient's history has been reviewed, patient examined, no change in status, stable for surgery.  I have reviewed the patient's chart and labs.  Questions were answered to the patient's satisfaction.     Mauri Pole

## 2018-11-30 NOTE — Anesthesia Postprocedure Evaluation (Signed)
Anesthesia Post Note  Patient: Tyler Estrada  Procedure(s) Performed: LEFT UNICOMPARTMENTAL KNEE Medially (Left Knee)     Patient location during evaluation: PACU Anesthesia Type: Regional and Spinal Level of consciousness: oriented and awake and alert Pain management: pain level controlled Vital Signs Assessment: post-procedure vital signs reviewed and stable Respiratory status: spontaneous breathing, respiratory function stable and patient connected to nasal cannula oxygen Cardiovascular status: blood pressure returned to baseline and stable Postop Assessment: no headache, no backache and no apparent nausea or vomiting Anesthetic complications: no    Last Vitals:  Vitals:   11/30/18 0930 11/30/18 0945  BP: 111/69   Pulse: 79 75  Resp: 12 18  Temp:    SpO2: 100% 100%    Last Pain:  Vitals:   11/30/18 0930  TempSrc:   PainSc: 0-No pain                 Gracin Mcpartland L Rodrecus Belsky

## 2018-11-30 NOTE — Evaluation (Signed)
Physical Therapy Evaluation Patient Details Name: Tyler Estrada MRN: [id number] DOB: [birth year] Today's Date: 11/30/2018   History of Present Illness  s/p L UKR  Clinical Impression  Pt is s/p L UKR resulting in the deficits listed below (see PT Problem List).   Pt  amb 95' with RW and min/guard assist, initiated HEP  Pt will benefit from skilled PT to increase their independence and safety with mobility to allow discharge to the venue listed below.      Follow Up Recommendations Follow surgeon's recommendation for DC plan and follow-up therapies    Equipment Recommendations  (likely ok to use his rollator)    Recommendations for Other Services       Precautions / Restrictions Precautions Precautions: Fall;Knee Restrictions Weight Bearing Restrictions: No      Mobility  Bed Mobility Overal bed mobility: Needs Assistance Bed Mobility: Supine to Sit     Supine to sit: Supervision     General bed mobility comments: for safety  Transfers Overall transfer level: Needs assistance Equipment used: Rolling walker (2 wheeled) Transfers: Sit to/from Stand Sit to Stand: Min guard         General transfer comment: cues for hand placement  Ambulation/Gait Ambulation/Gait assistance: Min guard Gait Distance (Feet): 80 Feet Assistive device: Rolling walker (2 wheeled) Gait Pattern/deviations: Step-through pattern     General Gait Details: cues for sequence  Stairs            Wheelchair Mobility    Modified Rankin (Stroke Patients Only)       Balance                                             Pertinent Vitals/Pain Pain Assessment: 0-10 Pain Score: 2  Pain Location: left knee Pain Descriptors / Indicators: Discomfort Pain Intervention(s): Limited activity within patient's tolerance;Premedicated before session;Monitored during session    Home Living Family/patient expects to be discharged to:: Private residence Living  Arrangements: Spouse/significant other Available Help at Discharge: Family Type of Home: House Home Access: Level entry     Home Layout: One level Home Equipment: Environmental consultant - 4 wheels      Prior Function Level of Independence: Independent               Hand Dominance        Extremity/Trunk Assessment   Upper Extremity Assessment Upper Extremity Assessment: Overall WFL for tasks assessed    Lower Extremity Assessment Lower Extremity Assessment: LLE deficits/detail LLE Deficits / Details: AROM grossly 5* to 70* flexion; ankle WFL; knee and hip ~ 2+ to 3/5       Communication   Communication: No difficulties  Cognition Arousal/Alertness: Awake/alert Behavior During Therapy: WFL for tasks assessed/performed Overall Cognitive Status: Within Functional Limits for tasks assessed                                        General Comments      Exercises Total Joint Exercises Ankle Circles/Pumps: AROM;10 reps;Both Quad Sets: AROM;10 reps;Both Heel Slides: AROM;Left;Other reps (comment)(7)   Assessment/Plan    PT Assessment Patient needs continued PT services  PT Problem List Decreased strength;Decreased mobility;Decreased activity tolerance;Decreased knowledge of use of DME;Pain;Decreased range of motion       PT Treatment Interventions DME  instruction;Gait training;Functional mobility training;Therapeutic activities;Therapeutic exercise    PT Goals (Current goals can be found in the Care Plan section)  Acute Rehab PT Goals PT Goal Formulation: With patient Time For Goal Achievement: 12/07/18 Potential to Achieve Goals: Good    Frequency 7X/week   Barriers to discharge        Co-evaluation               AM-PAC PT "6 Clicks" Mobility  Outcome Measure Help needed turning from your back to your side while in a flat bed without using bedrails?: A Little Help needed moving from lying on your back to sitting on the side of a flat bed  without using bedrails?: A Little Help needed moving to and from a bed to a chair (including a wheelchair)?: A Little Help needed standing up from a chair using your arms (e.g., wheelchair or bedside chair)?: A Little Help needed to walk in hospital room?: A Little Help needed climbing 3-5 steps with a railing? : A Little 6 Click Score: 18    End of Session Equipment Utilized During Treatment: Gait belt Activity Tolerance: Patient tolerated treatment well Patient left: in chair;with call bell/phone within reach;with chair alarm set   PT Visit Diagnosis: Other abnormalities of gait and mobility (R26.89)    Time: 5916-3846 PT Time Calculation (min) (ACUTE ONLY): 22 min   Charges:   PT Evaluation $PT Eval Low Complexity: 1 Low          Kenyon Ana, PT  Pager: [phone] Acute Rehab Dept [facility]): [phone]   11/30/2018   [facility] 11/30/2018, 4:23 PM

## 2018-11-30 NOTE — Anesthesia Procedure Notes (Signed)
Spinal  Patient location during procedure: OR Start time: 11/30/2018 7:29 AM End time: 11/30/2018 7:33 AM Staffing Resident/CRNA: Niel Hummer, CRNA Performed: resident/CRNA  Preanesthetic Checklist Completed: patient identified, surgical consent, pre-op evaluation, IV checked, risks and benefits discussed and monitors and equipment checked Spinal Block Patient position: sitting Prep: DuraPrep Patient monitoring: heart rate, continuous pulse ox and blood pressure Approach: midline Location: L2-3 Injection technique: single-shot Needle Needle type: Pencan  Needle gauge: 24 G Assessment Sensory level: T6

## 2018-11-30 NOTE — Anesthesia Procedure Notes (Signed)
Date/Time: 11/30/2018 7:26 AM Performed by: Niel Hummer, CRNA Pre-anesthesia Checklist: Patient being monitored, Suction available, Emergency Drugs available and Patient identified Patient Re-evaluated:Patient Re-evaluated prior to induction Oxygen Delivery Method: Simple face mask

## 2018-11-30 NOTE — Op Note (Addendum)
NAME: Laszlo Ellerby    MEDICAL RECORD NO.: [id number]   FACILITY: [facility]IRTH: July 19, 1956  PHYSICIAN: Pietro Cassis. Alvan Dame, M.D.    DATE OF PROCEDURE: 11/30/2018    OPERATIVE REPORT   PREOPERATIVE DIAGNOSIS: Left knee medial compartment osteoarthritis.   POSTOPERATIVE DIAGNOSIS: Left knee medial compartment osteoarthritis.   PROCEDURE: Left partial knee replacement utilizing Biomet Oxford knee  component, size small femur, a right medial size B tibial tray with a 4 mm insert.   SURGEON: Pietro Cassis. Alvan Dame, M.D.   ASSISTANT: Danae Orleans, PAC.  Please note that Mr. Guinevere Scarlet was present for the entirety of the case,  utilized for preoperative positioning, perioperative retractor  management, general facilitation of the case and primary wound closure.   ANESTHESIA: regional and spinal.   SPECIMENS: None.   COMPLICATIONS: None.  DRAINS: None   TOURNIQUET TIME: 24 minutes at 250 mmHg.   INDICATIONS FOR PROCEDURE: The patient is a 62 y.o. patient of mine who presented for evaluation of left knee pain.  They presented with primary complaints of pain on the medial side of their knee. Radiographs revealed advanced medial compartment arthritis with specifically an antero-medial wear pattern.  There was bone on bone changes noted with subchondral sclerosis and osteophytes present. The patient has had progressive problems failing to respond to conservative measures of medications, injections and activity modification. Risks of infection, DVT, component failure, need for future revision surgery were all discussed and reviewed.  Consent was obtained for benefit of pain relief.   PROCEDURE IN DETAIL: The patient was brought to the operative theater.  Once adequate anesthesia, preoperative antibiotics, 2 gm of Ancef administered, 1 gm of Tranexamic Acid, and 10 mg of Decadron given the patient was positioned in supine position with a left thigh tourniquet placed. The operative leg was  positioned over the Oxford leg holder such to allow for 120 degrees of flexion. The left lower extremity was prepped and draped in sterile fashion. A time-out  was performed identifying the patient, planned procedure, and extremity.  The operative leg was exsanguinated and the tourniquet elevated to 250 mmHg. A midline  incision was made from the proximal pole of the patella to the tibial tubercle. A  soft tissue plane was created and partial median arthrotomy was then  made to allow for subluxation of the patella. Following initial synovectomy and  debridement, the osteophytes were removed off the medial aspect of the knee and within the notch as needed.  The femur was first sized using the sizing spoons and determine to be a small femur.  The femoral spoon was then left in place to be attached to the extra medullary tibial guide.  The tibial extramedullary guide was positioned over the anterior crest of the tibia  and pinned into position, perpendicular in the coronal plane with appropriate slope.  Utilizing the size 3 G clamp I connected the cutting block tray to the femoral spine.  Upon confirming the orientation of the cutting block as it pertained to the extra medullary guide the tibial tray was pinned in place.  I then used a reciprocating saw along the medial border of the medial tibial spine.  The reciprocating saw was then used to complete the proximal tibial osteotomy.  The proximal tibial bone was removed and identified to have complete loss of cartilage and anterior medial pattern.  The cut surface of the tibia was found to be best covered with the B tibial tray.  At this point I  checked the tension of the medial collateral ligament at 90 degrees.  With the retractors out of the wound and the knee held at 90 degrees the 4 feeler gauge had appropriate tension on the medial ligament.   At this point, the femoral canal was opened with a 6 mm drill followed by the starting awl and then the   intramedullary was positioned within the canal.  Then the small posterior cutting block guide set to match the flexion gap was positioned onto the cut surface of the tibia and then length to the intramedullary rod using the articulating link.  This posterior cutting block guide was positioned over the middle portion of the medial femoral condyle in line with the normal kinematic motion of the knee.  The 2 drill holes were then made into the distal femur.  Once this was done the posterior cutting block guide and length were removed.    The posterior guide was then impacted into place on the distal femur and the posterior  femoral cut made.   At this point, based on the tension on the medial collateral ligament at 90 degrees as previously assessed I selected the size 3 spigot and pushed it to the distal femur.  I then milled the distal femur.  Excess of bone from the distal femur and then medially and laterally were removed using osteotome and rondure.  We now did our first trial reduction with the small femur and the B tibial tray.  I now assessed the ligament balancing at 90 and 20 degrees of flexion.     I found that the tension at 90 degrees was appropriate with the 4 feeler gauge but found that the tension at 20 degrees matched the tension of the medial collateral ligament with the 3 feeler gauge.  Per the protocol based on the mismatch of tensioning at 90 and 20 degrees of flexion I placed the 4 spigot and re-milled the distal femur.  Once the remaining bone off the distal femur was removed a repeat trial reduction was carried out.  At this point I found that the medial collateral ligament tension was symmetric at 90 and 20 degrees of flexion with the 4 feeler gauge.    Given these findings, the trial femoral component was removed. Final preparation of tibia was carried out by pinning it in position. Then  using a reciprocating saw I removed bone for the keel. Further bone was  removed with an  osteotome.  Trial reduction was now carried out with the small femur, the left keeled B tibial tray, and the size 4 lollipop insert. The balance of the  ligaments appeared to be symmetric at 20 degrees and 90 degrees. Given  all these findings, the trial components were removed.  The final components were opened. The knee was irrigated with  normal saline solution.  The medial synovial capsule junction of the knee was injected with 30 cc of quarter percent Marcaine with epinephrine, 1 cc of Toradol, and 30 cc of normal saline.  Any final debridements of the soft tissue or osteophytes was carried out at this point.  Sclerotic bone on the distal femur and proximal tibia were drilled with the drill and the set.    The final components were cemented onto a clean and dried cut surfaces of bone. Excessive cement was removed and the knee was then held at 45 degrees of flexion with the size 4 feeler gauge until the cement cured. Excess cement was removed throughout the knee.  Tourniquet was let down  after 24 minutes. After the cement had fully cured and excessive cement  was removed throughout the knee there was no visualized cement present.  Before selecting the final insert re-trialed with the 4 lollipop insert.  Given that these findings confirmed our initial trial reduction we selected the final size 4 mm, left medial insert to match the small femur.  Following irrigation of the knee this final insert was snapped into place.   The extensor mechanism  was then reapproximated using a #1 Vicryl and #1 Stratafix suture with the knee in flexion. The  remaining wound was closed with 2-0 Vicryl and a running 4-0 Monocryl.  The knee was cleaned, dried, and dressed sterilely using Dermabond and  Aquacel dressing. The patient  was brought to the recovery room, Ace wrap in place, tolerating the  procedure well.     Pietro Cassis Alvan Dame, M.D.

## 2018-11-30 NOTE — Discharge Instructions (Signed)

## 2018-11-30 NOTE — Transfer of Care (Signed)
Immediate Anesthesia Transfer of Care Note  Patient: Tyler Estrada  Procedure(s) Performed: LEFT UNICOMPARTMENTAL KNEE Medially (Left Knee)  Patient Location: PACU  Anesthesia Type:Spinal  Level of Consciousness: awake, alert  and oriented  Airway & Oxygen Therapy: Patient Spontanous Breathing and Patient connected to face mask oxygen  Post-op Assessment: Report given to RN and Post -op Vital signs reviewed and stable  Post vital signs: Reviewed and stable  Last Vitals:  Vitals Value Taken Time  BP 97/65 11/30/2018  9:15 AM  Temp    Pulse 84 11/30/2018  9:17 AM  Resp 12 11/30/2018  9:17 AM  SpO2 99 % 11/30/2018  9:17 AM  Vitals shown include unvalidated device data.  Last Pain:  Vitals:   11/30/18 0616  TempSrc:   PainSc: 0-No pain      Patients Stated Pain Goal: 4 (29/84/73 0856)  Complications: No apparent anesthesia complications

## 2018-12-01 ENCOUNTER — Encounter (HOSPITAL_COMMUNITY): Payer: Self-pay | Admitting: Orthopedic Surgery

## 2018-12-01 DIAGNOSIS — M1712 Unilateral primary osteoarthritis, left knee: Secondary | ICD-10-CM | POA: Diagnosis not present

## 2018-12-01 DIAGNOSIS — E669 Obesity, unspecified: Secondary | ICD-10-CM | POA: Diagnosis present

## 2018-12-01 LAB — CBC
HCT: 34.2 % — ABNORMAL LOW (ref 39.0–52.0)
Hemoglobin: 10.8 g/dL — ABNORMAL LOW (ref 13.0–17.0)
MCH: 29.3 pg (ref 26.0–34.0)
MCHC: 31.6 g/dL (ref 30.0–36.0)
MCV: 92.9 fL (ref 80.0–100.0)
Platelets: 187 10*3/uL (ref 150–400)
RBC: 3.68 MIL/uL — ABNORMAL LOW (ref 4.22–5.81)
RDW: 13.4 % (ref 11.5–15.5)
WBC: 11.8 10*3/uL — ABNORMAL HIGH (ref 4.0–10.5)
nRBC: 0 % (ref 0.0–0.2)

## 2018-12-01 LAB — BASIC METABOLIC PANEL
Anion gap: 7 (ref 5–15)
BUN: 13 mg/dL (ref 8–23)
CO2: 22 mmol/L (ref 22–32)
CREATININE: 0.7 mg/dL (ref 0.61–1.24)
Calcium: 8.3 mg/dL — ABNORMAL LOW (ref 8.9–10.3)
Chloride: 112 mmol/L — ABNORMAL HIGH (ref 98–111)
GFR calc Af Amer: >60 mL/min (ref >60)
GFR calc non Af Amer: >60 mL/min (ref >60)
Glucose, Bld: 127 mg/dL — ABNORMAL HIGH (ref 70–99)
Potassium: 3.7 mmol/L (ref 3.5–5.1)
Sodium: 141 mmol/L (ref 135–145)

## 2018-12-01 NOTE — Progress Notes (Signed)
     Subjective: 1 Day Post-Op Procedure(s) (LRB): LEFT UNICOMPARTMENTAL KNEE Medially (Left)   Patient reports pain as mild, pain controlled. No events throughout the night.  Feels that he is doing well. Ready to be discharged home.   Objective:   VITALS:   Vitals:   12/01/18 0151 12/01/18 0535  BP: 108/60 120/66  Pulse: 89 97  Resp: 16 16  Temp: 98.4 F (36.9 C) 98 F (36.7 C)  SpO2: 97% 98%    Dorsiflexion/Plantar flexion intact Incision: dressing C/D/I No cellulitis present Compartment soft  LABS Recent Labs    12/01/18 0401  HGB 10.8*  HCT 34.2*  WBC 11.8*  PLT 187    Recent Labs    12/01/18 0401  NA 141  K 3.7  BUN 13  CREATININE 0.70  GLUCOSE 127*     Assessment/Plan: 1 Day Post-Op Procedure(s) (LRB): LEFT UNICOMPARTMENTAL KNEE Medially (Left) Foley cath d/c'ed Advance diet Up with therapy D/C IV fluids Discharge home Follow up in 2 weeks at [facility] ([place]). Follow up with OLIN,Heydy Montilla D in 2 weeks.  Contact information:  [facility]) [address], [place] [phone]    Obese (BMI 30-39.9) Estimated body mass index is 30.65 kg/m as calculated from the following:   Height as of this encounter: 5' 4"  (1.626 m).   Weight as of this encounter: 81 kg. Patient also counseled that weight may inhibit the healing process Patient counseled that losing weight will help with future health issues      West Pugh. Maycol Hoying   PAC  12/01/2018, 8:10 AM

## 2018-12-01 NOTE — Progress Notes (Signed)
Physical Therapy Treatment Patient Details Name: Tyler Estrada MRN: [id number] DOB: 24-Sep-1956 Today's Date: 12/01/2018    History of Present Illness s/p L UKR    PT Comments    Pt progressing well this am; completed gait and HEP; pt ready for d/c from PT standpoint;   Follow Up Recommendations  Follow surgeon's recommendation for DC plan and follow-up therapies     Equipment Recommendations  Other (comment)(should be ok to use rollator)    Recommendations for Other Services       Precautions / Restrictions Precautions Precautions: Fall;Knee Restrictions Weight Bearing Restrictions: No    Mobility  Bed Mobility Overal bed mobility: Needs Assistance Bed Mobility: Supine to Sit     Supine to sit: Supervision;Modified independent (Device/Increase time)        Transfers Overall transfer level: Needs assistance Equipment used: Rolling walker (2 wheeled) Transfers: Sit to/from Stand Sit to Stand: Supervision         General transfer comment: cues for hand placement  Ambulation/Gait Ambulation/Gait assistance: Supervision Gait Distance (Feet): 100 Feet Assistive device: Rolling walker (2 wheeled) Gait Pattern/deviations: Step-through pattern     General Gait Details: cues for sequence and gait progression   Stairs             Wheelchair Mobility    Modified Rankin (Stroke Patients Only)       Balance                                            Cognition Arousal/Alertness: Awake/alert Behavior During Therapy: WFL for tasks assessed/performed Overall Cognitive Status: Within Functional Limits for tasks assessed                                        Exercises Total Joint Exercises Ankle Circles/Pumps: AROM;10 reps;Both Quad Sets: AROM;10 reps;Both Short Arc Quad: AROM;Left;10 reps Heel Slides: AROM;AAROM;Left;10 reps Hip ABduction/ADduction: AAROM;Left;10 reps Straight Leg Raises: AROM;Left;10  reps Goniometric ROM: grossly 5* to 68* left knee flexion    General Comments        Pertinent Vitals/Pain Pain Assessment: 0-10 Pain Score: 4  Pain Location: left knee Pain Descriptors / Indicators: Discomfort Pain Intervention(s): Limited activity within patient's tolerance;Monitored during session;Premedicated before session    Home Living                      Prior Function            PT Goals (current goals can now be found in the care plan section) Acute Rehab PT Goals Patient Stated Goal: home PT Goal Formulation: With patient Time For Goal Achievement: 12/07/18 Potential to Achieve Goals: Good Progress towards PT goals: Progressing toward goals    Frequency    7X/week      PT Plan Current plan remains appropriate    Co-evaluation              AM-PAC PT "6 Clicks" Mobility   Outcome Measure  Help needed turning from your back to your side while in a flat bed without using bedrails?: None Help needed moving from lying on your back to sitting on the side of a flat bed without using bedrails?: None Help needed moving to and from a bed to a chair (including a wheelchair)?:  None Help needed standing up from a chair using your arms (e.g., wheelchair or bedside chair)?: A Little Help needed to walk in hospital room?: A Little Help needed climbing 3-5 steps with a railing? : A Little 6 Click Score: 21    End of Session Equipment Utilized During Treatment: Gait belt Activity Tolerance: Patient tolerated treatment well Patient left: with call bell/phone within reach;in chair   PT Visit Diagnosis: Other abnormalities of gait and mobility (R26.89)     Time: 1308-6578 PT Time Calculation (min) (ACUTE ONLY): 28 min  Charges:  $Gait Training: 8-22 mins $Therapeutic Exercise: 8-22 mins                     Kenyon Ana, PT  Pager: [phone] Acute Rehab Dept [facility]): [phone]   12/01/2018    [facility] 12/01/2018, 10:08 AM

## 2018-12-04 DIAGNOSIS — M25662 Stiffness of left knee, not elsewhere classified: Secondary | ICD-10-CM | POA: Diagnosis not present

## 2018-12-04 DIAGNOSIS — M25562 Pain in left knee: Secondary | ICD-10-CM | POA: Diagnosis not present

## 2018-12-05 ENCOUNTER — Other Ambulatory Visit: Payer: Self-pay | Admitting: Internal Medicine

## 2018-12-05 NOTE — Discharge Summary (Signed)
Physician Discharge Summary  Patient ID: Tyler Estrada MRN: [id number] DOB/AGE: 62-Oct-1957 62 y.o.  Admit date: 11/30/2018 Discharge date: 12/01/2018   Procedures:  Procedure(s) (LRB): LEFT UNICOMPARTMENTAL KNEE Medially (Left)  Attending Physician:  Dr. Paralee Cancel   Admission Diagnoses:   Left knee medial compartmental primary OA /pain  Discharge Diagnoses:  Principal Problem:   S/P left UKR Active Problems:   Obese  Past Medical History:  Diagnosis Date  . Arthritis    knees, hips, hands  . Complication of anesthesia    Woke up during hip replacement  . Coronary artery disease    a. s/p DES to RCA in 2008;  b. 10/2015 Cath: LM nl, LAD 50ost/p, OM1/2 min irregs, RCA 70mISR, 339m ISR, EF 65%.  . Echocardiogram    Echo 11/16: EF 60-65, normal wall motion, grade 1 diastolic dysfunction  . Grade I diastolic dysfunction 1108/67/6195 ECHO  . Hyperlipidemia   . Iron deficiency anemia due to chronic blood loss 09/10/2015  . [place] ([place]   Nose  . Pericarditis    a. 10/2015->colchicine.  . Marland [place]lcerative colitis with complication ([place]/0/93/2671  HPI:    Tyler Staver89.o. male , has a history of pain and functional disability in the left and has failed non-surgical conservative treatments for greater than 12 weeks to include NSAID's and/or analgesics, corticosteriod injections and activity modification.  Onset of symptoms was gradual, starting  years ago with gradually worsening course since that time. The patient noted no past surgery on the left knee(s).  Patient currently rates pain in the left knee(s) at 9 out of 10 with activity. Patient has night pain, worsening of pain with activity and weight bearing, pain that interferes with activities of daily living, pain with passive range of motion, crepitus and joint swelling.  Patient has evidence of periarticular osteophytes and joint space narrowing of the medial compartment by imaging studies.  There is no active  infection.  Risks, benefits and expectations were discussed with the patient.  Risks including but not limited to the risk of anesthesia, blood clots, nerve damage, blood vessel damage, failure of the prosthesis, infection and up to and including death.  Patient understand the risks, benefits and expectations and wishes to proceed with surgery.   PCP: Patient, No Pcp Per   Discharged Condition: good  Hospital Course:  Patient underwent the above stated procedure on 11/30/2018. Patient tolerated the procedure well and brought to the recovery room in good condition and subsequently to the floor.  POD #1 BP: 120/66 ; Pulse: 97 ; Temp: 98 F (36.7 C) ; Resp: 16 Patient reports pain as mild, pain controlled. No events throughout the night.  Feels that he is doing well. Ready to be discharged home. Dorsiflexion/plantar flexion intact, incision: dressing C/D/I, no cellulitis present and compartment soft.   LABS  Basename    HGB     10.8  HCT     34.2    Discharge Exam: General appearance: alert, cooperative and no distress Extremities: Homans sign is negative, no sign of DVT, no edema, redness or tenderness in the calves or thighs and no ulcers, gangrene or trophic changes  Disposition:  Home with follow up in 2 weeks   Follow-up Information    OlParalee CancelMD. Schedule an appointment as soon as possible for a visit in 2 weeks.   Specialty:  Orthopedic Surgery Contact information: [address] [place] [phone]  Discharge Instructions    Call MD / Call 911   Complete by:  As directed    If you experience chest pain or shortness of breath, CALL 911 and be transported to the hospital emergency room.  If you develope a fever above 101 F, pus (white drainage) or increased drainage or redness at the wound, or calf pain, call your surgeon's office.   Change dressing   Complete by:  As directed    Maintain surgical dressing until follow up  in the clinic. If the edges start to pull up, may reinforce with tape. If the dressing is no longer working, may remove and cover with gauze and tape, but must keep the area dry and clean.  Call with any questions or concerns.   Constipation Prevention   Complete by:  As directed    Drink plenty of fluids.  Prune juice may be helpful.  You may use a stool softener, such as Colace (over the counter) 100 mg twice a day.  Use MiraLax (over the counter) for constipation as needed.   Diet - low sodium heart healthy   Complete by:  As directed    Discharge instructions   Complete by:  As directed    Maintain surgical dressing until follow up in the clinic. If the edges start to pull up, may reinforce with tape. If the dressing is no longer working, may remove and cover with gauze and tape, but must keep the area dry and clean.  Follow up in 2 weeks at [facility]. Call with any questions or concerns.   Increase activity slowly as tolerated   Complete by:  As directed    Weight bearing as tolerated with assist device (walker, cane, etc) as directed, use it as long as suggested by your surgeon or therapist, typically at least 4-6 weeks.   TED hose   Complete by:  As directed    Use stockings (TED hose) for 2 weeks on both leg(s).  You may remove them at night for sleeping.      Allergies as of 12/01/2018      Reactions   Prednisone    Blurred vision       Medication List    STOP taking these medications   aspirin EC 81 MG tablet Replaced by:  aspirin 81 MG chewable tablet   diphenhydramine-acetaminophen 25-500 MG Tabs tablet Commonly known as:  TYLENOL PM   mesalamine 1.2 g EC tablet Commonly known as:  LIALDA     TAKE these medications   aspirin 81 MG chewable tablet Commonly known as:  ASPIRIN CHILDRENS Chew 1 tablet (81 mg total) by mouth 2 (two) times daily. Take for 4 weeks, then resume regular dose. Replaces:  aspirin EC 81 MG tablet   CENTRUM SILVER PO Take 1  tablet by mouth daily.   desloratadine 5 MG tablet Commonly known as:  CLARINEX Take 5 mg by mouth daily.   docusate sodium 100 MG capsule Commonly known as:  COLACE Take 1 capsule (100 mg total) by mouth 2 (two) times daily.   ferrous sulfate 325 (65 FE) MG tablet Commonly known as:  FERROUSUL Take 1 tablet (325 mg total) by mouth 3 (three) times daily with meals.   Fish Oil 1200 MG Caps Take 1 capsule by mouth at bedtime.   HYDROcodone-acetaminophen 7.5-325 MG tablet Commonly known as:  NORCO Take 1-2 tablets by mouth every 4 (four) hours as needed for moderate pain.   HYDROcodone-acetaminophen 7.5-325 MG tablet  Commonly known as:  NORCO Take 1-2 tablets by mouth every 4 (four) hours as needed for moderate pain.   methocarbamol 500 MG tablet Commonly known as:  ROBAXIN Take 1 tablet (500 mg total) by mouth every 6 (six) hours as needed for muscle spasms.   metoprolol tartrate 25 MG tablet Commonly known as:  LOPRESSOR Take 0.5 tablets (12.5 mg total) by mouth daily.   MITIGARE 0.6 MG Caps Generic drug:  Colchicine Take 0.6 mg by mouth daily. Changed to Brand name to see if insurance will cover [place] What changed:    when to take this  reasons to take this   nitroGLYCERIN 0.4 MG SL tablet Commonly known as:  NITROSTAT Place 1 tablet (0.4 mg total) under the tongue every 5 (five) minutes x 3 doses as needed for chest pain.   polyethylene glycol packet Commonly known as:  MIRALAX / GLYCOLAX Take 17 g by mouth 2 (two) times daily.            Discharge Care Instructions  (From admission, onward)         Start     Ordered   12/01/18 0000  Change dressing    Comments:  Maintain surgical dressing until follow up in the clinic. If the edges start to pull up, may reinforce with tape. If the dressing is no longer working, may remove and cover with gauze and tape, but must keep the area dry and clean.  Call with any questions or concerns.   12/01/18 4287             Signed: West Pugh. Marjani Kobel   PA-C  12/05/2018, 3:52 PM

## 2018-12-06 DIAGNOSIS — M25562 Pain in left knee: Secondary | ICD-10-CM | POA: Diagnosis not present

## 2018-12-08 DIAGNOSIS — M25562 Pain in left knee: Secondary | ICD-10-CM | POA: Diagnosis not present

## 2018-12-12 DIAGNOSIS — M25562 Pain in left knee: Secondary | ICD-10-CM | POA: Diagnosis not present

## 2018-12-14 DIAGNOSIS — M25562 Pain in left knee: Secondary | ICD-10-CM | POA: Diagnosis not present

## 2018-12-18 DIAGNOSIS — M25562 Pain in left knee: Secondary | ICD-10-CM | POA: Diagnosis not present

## 2018-12-21 DIAGNOSIS — M25562 Pain in left knee: Secondary | ICD-10-CM | POA: Diagnosis not present

## 2018-12-26 DIAGNOSIS — M25562 Pain in left knee: Secondary | ICD-10-CM | POA: Diagnosis not present

## 2018-12-28 DIAGNOSIS — M25562 Pain in left knee: Secondary | ICD-10-CM | POA: Diagnosis not present

## 2019-01-02 ENCOUNTER — Other Ambulatory Visit: Payer: Self-pay | Admitting: Internal Medicine

## 2019-01-02 DIAGNOSIS — M25562 Pain in left knee: Secondary | ICD-10-CM | POA: Diagnosis not present

## 2019-01-05 DIAGNOSIS — M25562 Pain in left knee: Secondary | ICD-10-CM | POA: Diagnosis not present

## 2019-01-12 DIAGNOSIS — Z96652 Presence of left artificial knee joint: Secondary | ICD-10-CM | POA: Diagnosis not present

## 2019-01-12 DIAGNOSIS — Z471 Aftercare following joint replacement surgery: Secondary | ICD-10-CM | POA: Diagnosis not present

## 2019-01-15 ENCOUNTER — Encounter: Payer: Self-pay | Admitting: Internal Medicine

## 2019-01-15 ENCOUNTER — Ambulatory Visit (INDEPENDENT_AMBULATORY_CARE_PROVIDER_SITE_OTHER): Payer: 59 | Admitting: Internal Medicine

## 2019-01-15 ENCOUNTER — Telehealth: Payer: Self-pay

## 2019-01-15 VITALS — BP 118/74 | HR 72 | Ht 64.0 in | Wt 186.8 lb

## 2019-01-15 DIAGNOSIS — I2510 Atherosclerotic heart disease of native coronary artery without angina pectoris: Secondary | ICD-10-CM | POA: Diagnosis not present

## 2019-01-15 DIAGNOSIS — R3911 Hesitancy of micturition: Secondary | ICD-10-CM | POA: Diagnosis not present

## 2019-01-15 DIAGNOSIS — E785 Hyperlipidemia, unspecified: Secondary | ICD-10-CM

## 2019-01-15 DIAGNOSIS — I10 Essential (primary) hypertension: Secondary | ICD-10-CM | POA: Diagnosis not present

## 2019-01-15 NOTE — Telephone Encounter (Signed)
ok 

## 2019-01-15 NOTE — Telephone Encounter (Signed)
Copied from [place] [id number]. Topic: Appointment Scheduling - Scheduling Inquiry for Clinic >> Jan 15, 2019 11:17 AM Margot Ables wrote: Reason for CRM: Pt called stating that his cardiologist, Dr. Wyatt Haste, has referred pt to Dr. Lamar Blinks for PCP. He was seeing Dr. Eloise Levels and the practice there closed and she moved. Pt is asking if Dr. Lorelei Pont will make an exception as Dr. Harrington Challenger recommended her highly. Please advise.

## 2019-01-15 NOTE — Patient Instructions (Signed)
Medication Instructions:  No change If you need a refill on your cardiac medications before your next appointment, please call your pharmacy.   Lab work: Today: lipids/psa/cbc If you have labs (blood work) drawn today and your tests are completely normal, you will receive your results only by: Marland Kitchen MyChart Message (if you have MyChart) OR . A paper copy in the mail If you have any lab test that is abnormal or we need to change your treatment, we will call you to review the results.  Testing/Procedures: none  Follow-Up: At [facility], you and your health needs are our priority.  As part of our continuing mission to provide you with exceptional heart care, we have created designated Provider Care Teams.  These Care Teams include your primary Cardiologist (physician) and Advanced Practice Providers (APPs -  Physician Assistants and Nurse Practitioners) who all work together to provide you with the care you need, when you need it. You will need a follow up appointment in:  9-10 months.  Please call our office 2 months in advance to schedule this appointment.  You may see Dorris Carnes, MD or one of the following Advanced Practice Providers on your designated Care Team: Richardson Dopp, PA-C [place], [place] . Daune Perch, NP  Any Other Special Instructions Will Be Listed Below (If Applicable).

## 2019-01-15 NOTE — Progress Notes (Signed)
Cardiology Office Note   Date:  01/15/2019   ID:  Tyler Estrada, DOB 16-Nov-1956, MRN [id number]  PCP:  Patient, No Pcp Per  Cardiologist:   Dorris Carnes, MD   F/U of CAD     History of Present Illness: Tyler Estrada is a 63 y.o. male with a history ofcoronary artery disease status post prior stenting to the RCA in 2008, prior pericarditis treated with colchicine, hypertension, hyperlipidemia, ulcerative colitis.  Cardiac catheterization in November 2016 demonstrated patent stents in the RCA and moderate nonobstructive disease in the LAD.  Ejection fraction by echocardiogram in 2016 was normal.  He was last seen by Dr. Harrington Challenger in January 2019.     The pt was seen Fall 2019 for preop assessment (Colonoscopy by Dr. Watt Climes and a left partial knee replacement in December with Salli Quarry)  Since seen he has gottent through these procedures OK     Denies CP   Breathing is OK   No dizziness  Prior CV studies:   The following studies were reviewed today:        Current Meds  Medication Sig  . desloratadine (CLARINEX) 5 MG tablet Take 5 mg by mouth daily.  Marland Kitchen docusate sodium (COLACE) 100 MG capsule Take 1 capsule (100 mg total) by mouth 2 (two) times daily.  . ferrous sulfate (FERROUSUL) 325 (65 FE) MG tablet Take 1 tablet (325 mg total) by mouth 3 (three) times daily with meals.  Marland Kitchen HYDROcodone-acetaminophen (NORCO) 7.5-325 MG tablet Take 1-2 tablets by mouth every 4 (four) hours as needed for moderate pain.  . methocarbamol (ROBAXIN) 500 MG tablet Take 1 tablet (500 mg total) by mouth every 6 (six) hours as needed for muscle spasms.  . metoprolol tartrate (LOPRESSOR) 25 MG tablet TAKE 0.5 TABLETS (12.5 MG TOTAL) BY MOUTH DAILY.  Marland Kitchen MITIGARE 0.6 MG CAPS Take 0.6 mg by mouth daily. Changed to Brand name to see if insurance will cover [facility] (Patient taking differently: Take 0.6 mg by mouth daily as needed (Chest pain). Changed to Brand name to see if insurance will cover [facility])  .  Multiple Vitamins-Minerals (CENTRUM SILVER PO) Take 1 tablet by mouth daily.   . nitroGLYCERIN (NITROSTAT) 0.4 MG SL tablet Place 1 tablet (0.4 mg total) under the tongue every 5 (five) minutes x 3 doses as needed for chest pain.  . Omega-3 Fatty Acids (FISH OIL) 1200 MG CAPS Take 1 capsule by mouth at bedtime.   . polyethylene glycol (MIRALAX / GLYCOLAX) packet Take 17 g by mouth 2 (two) times daily.  . simvastatin (ZOCOR) 40 MG tablet TAKE 1 TABLET BY MOUTH EVERY DAY     Allergies:   Prednisone   Past Medical History:  Diagnosis Date  . Arthritis    knees, hips, hands  . Complication of anesthesia    Woke up during hip replacement  . Coronary artery disease    a. s/p DES to RCA in 2008;  b. 10/2015 Cath: LM nl, LAD 50ost/p, OM1/2 min irregs, RCA 89mISR, 348m ISR, EF 65%.  . Echocardiogram    Echo 11/16: EF 60-65, normal wall motion, grade 1 diastolic dysfunction  . Grade I diastolic dysfunction 1132/67/1245 ECHO  . Hyperlipidemia   . Iron deficiency anemia due to chronic blood loss 09/10/2015  . [place]   Nose  . Pericarditis    a. 10/2015->colchicine.  . Marland Kitchenlcerative colitis with complication ([facility]/07/28/9832  Past Surgical History:  Procedure Laterality Date  .  CARDIAC CATHETERIZATION N/A 11/18/2015   Procedure: Left Heart Cath and Coronary Angiography;  Surgeon: Sherren Mocha, MD;  Location: [facility];  Service: Cardiovascular;  Laterality: N/A;  . COLONOSCOPY  2019  . CORONARY ANGIOPLASTY WITH STENT PLACEMENT  2008   a. Cordis stent to RCA in 2008 2.18m x 181m . HIP ARTHROPLASTY Right   . MOHS SURGERY    . PARTIAL KNEE ARTHROPLASTY Left 11/30/2018   Procedure: LEFT UNICOMPARTMENTAL KNEE Medially;  Surgeon: OlParalee CancelMD;  Location: [facility];  Service: Orthopedics;  Laterality: Left;  70 mins with block  . TONSILLECTOMY     childhood     Social History:  The patient  reports that he has never smoked. He has never used smokeless tobacco. He  reports current alcohol use. He reports that he does not use drugs.   Family History:  The patient's family history includes CAD in his father; Heart murmur in his sister; Hypertension in his brother and mother; Lymphoma in his mother.    ROS:  Please see the history of present illness. All other systems are reviewed and  Negative to the above problem except as noted.    PHYSICAL EXAM: VS:  BP 118/74   Pulse 72   Ht 5' 4"  (1.626 m)   Wt 186 lb 12.8 oz (84.7 kg)   SpO2 97%   BMI 32.06 kg/m   GEN: Obese 6252o  in no acute distress  HEENT: normal  Neck: JVP is not elevated   Neck is full  No , carotid bruits, or masses Cardiac: RRR; no murmurs, rubs, or gallops,no edema  Respiratory:  clear to auscultation bilaterally, normal work of breathing GI: soft, nontender, nondistended, + BS  No hepatomegaly  MS: no deformity Moving all extremities   Skin: warm and dry, no rash Neuro:  Strength and sensation are intact Psych: euthymic mood, full affect   EKG:  EKG is not ordered today.   Lipid Panel    Component Value Date/Time   CHOL 138 12/29/2017 1011   TRIG 72 12/29/2017 1011   HDL 53 12/29/2017 1011   CHOLHDL 2.6 12/29/2017 1011   CHOLHDL 2.8 10/07/2016 0857   VLDL 14 10/07/2016 0857   LDLCALC 71 12/29/2017 1011      Wt Readings from Last 3 Encounters:  01/15/19 186 lb 12.8 oz (84.7 kg)  11/30/18 178 lb 9.2 oz (81 kg)  11/15/18 185 lb (83.9 kg)      ASSESSMENT AND PLAN:  1  CAD   No symotms to sugg angina   Continue to follow  2  HTN  BP is good    3  HL  Will check lipdis today  4 HCM   Needs to est with primary care  Will check PSA  Along with CBC      Current medicines are reviewed at length with the patient today.  The patient does not have concerns regarding medicines.  Signed, PaDorris CarnesMD  01/15/2019 9:29 AM    [email or web address]  [id number]: [phone]: [phone]

## 2019-01-16 LAB — LIPID PANEL
Chol/HDL Ratio: 2.9 ratio (ref 0.0–5.0)
Cholesterol, Total: 133 mg/dL (ref 100–199)
HDL: 46 mg/dL (ref >39)
LDL Calculated: 67 mg/dL (ref 0–99)
Triglycerides: 102 mg/dL (ref 0–149)
VLDL Cholesterol Cal: 20 mg/dL (ref 5–40)

## 2019-01-16 LAB — CBC
Hematocrit: 38.1 % (ref 37.5–51.0)
Hemoglobin: 12.8 g/dL — ABNORMAL LOW (ref 13.0–17.7)
MCH: 28.8 pg (ref 26.6–33.0)
MCHC: 33.6 g/dL (ref 31.5–35.7)
MCV: 86 fL (ref 79–97)
Platelets: 277 10*3/uL (ref 150–450)
RBC: 4.44 x10E6/uL (ref 4.14–5.80)
RDW: 12.7 % (ref 11.6–15.4)
WBC: 5.6 10*3/uL (ref 3.4–10.8)

## 2019-01-16 LAB — PSA: Prostate Specific Ag, Serum: 94.7 ng/mL — ABNORMAL HIGH (ref 0.0–4.0)

## 2019-01-16 NOTE — Telephone Encounter (Signed)
Okay to schedule NP appt with Dr. Lorelei Pont.

## 2019-01-16 NOTE — Telephone Encounter (Signed)
Schedule pt new pt appt for 02-21-2019 with [facility]. Done

## 2019-01-17 DIAGNOSIS — R972 Elevated prostate specific antigen [PSA]: Secondary | ICD-10-CM | POA: Diagnosis not present

## 2019-02-12 DIAGNOSIS — R972 Elevated prostate specific antigen [PSA]: Secondary | ICD-10-CM | POA: Diagnosis not present

## 2019-02-16 NOTE — Progress Notes (Signed)
[facility] [address], [place], [place] [address] [phone]  Date:  02/21/2019   Name:  Tyler Estrada   DOB:  1956/02/03   MRN:  [id number]  PCP:  Darreld Mclean, MD    Chief Complaint: New Patient (Initial Visit) (PSA elevated in past-Dr. Tammi Klippel appt on 13th)   History of Present Illness:  Tyler Estrada is a 63 y.o. very pleasant male patient who presents with the following:  Here today as a new patient to establish care History of CAD with stent in 2008, HTN, hyperlipidemia, UC He did not have an MI at he time of his stent thankfully  Her also has had pericarditis in the past per his chart- however pt thinks that this was really not true  His cardiologist is Dr. Harrington Challenger, saw her last month: 1  CAD   No symotms to sugg angina   Continue to follow 2  HTN  BP is good   3  HL  Will check lipdis today 4 HCM   Needs to est with primary care  Will check PSA  Along with CBC     He moved to  from [place] about 5 years ago to be closer to family He is mostly healthy, has been looking for a PCP as he gets older  He also sees Dr. 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 Obese 12/01/2018  . S/P left UKR 11/30/2018  . Pericarditis   . Elevated troponin 11/19/2015  . Acute pericarditis 11/19/2015  . Hyperlipidemia   . CAD S/P percutaneous coronary angioplasty 11/18/2015  . Acute chest pain 11/18/2015  . Chest pain 11/18/2015  . Iron deficiency anemia due to chronic blood loss 09/10/2015  . Ulcerative colitis with complication ([facility]) 78/67/6720    Past Medical History:  Diagnosis Date  . Arthritis    knees, hips, hands  . Complication of anesthesia    Woke up during hip replacement  . Coronary artery disease    a. s/p DES to RCA in 2008;  b. 10/2015 Cath: LM nl, LAD 50ost/p, OM1/2 min irregs, RCA 30mISR, 334m ISR, EF 65%.  . Echocardiogram    Echo 11/16: EF 60-65, normal wall motion, grade 1 diastolic dysfunction  . Grade I diastolic dysfunction 1194/70/9628 ECHO  . Hyperlipidemia   . Iron deficiency anemia due to chronic blood loss 09/10/2015  .  Melanoma ([facility])    Nose  . Pericarditis    a. 10/2015->colchicine.  Marland Kitchen Ulcerative colitis with complication ([facility]) 1/61/0960    Past Surgical History:  Procedure Laterality Date  . CARDIAC CATHETERIZATION N/A 11/18/2015   Procedure: Left Heart Cath and Coronary Angiography;  Surgeon: Sherren Mocha, MD;  Location: [facility];  Service: Cardiovascular;  Laterality: N/A;  . COLONOSCOPY  2019  . CORONARY ANGIOPLASTY WITH STENT PLACEMENT  2008   a. Cordis stent to RCA in 2008 2.45m x 131m . HIP ARTHROPLASTY Right   . MOHS SURGERY    . PARTIAL KNEE ARTHROPLASTY Left 11/30/2018   Procedure: LEFT UNICOMPARTMENTAL KNEE Medially;  Surgeon: OlParalee CancelMD;  [facility];  Service: Orthopedics;  Laterality: Left;  70 mins with block  . TONSILLECTOMY     childhood    Social History   Tobacco Use  . Smoking status: Never Smoker  . Smokeless tobacco: Never Used  Substance Use  Topics  . Alcohol use: Yes    Alcohol/week: 0.0 standard drinks    Comment: 1-2 drinks per week.  . Drug use: No    Family History  Problem Relation Age of Onset  . Lymphoma Mother   . Hypertension Mother   . Arthritis Mother   . Cancer Mother   . CAD Father   . Heart disease Father   . Heart attack Father   . Arthritis Sister   . Heart disease Sister   . Hypertension Brother   . Heart murmur Sister        "as a small child, resolved on its own"    Allergies  Allergen Reactions  . Prednisone     Blurred vision     Medication list has been reviewed and updated.  Current Outpatient Medications on File Prior to Visit  Medication Sig Dispense Refill  . aspirin EC 81 MG tablet Take 81 mg by mouth daily.    . cetirizine (ZYRTEC) 10 MG tablet Take 10 mg by mouth daily.    . mesalamine (LIALDA) 1.2 g EC tablet Take by mouth daily with breakfast.    . metoprolol tartrate (LOPRESSOR) 25 MG tablet TAKE 0.5 TABLETS (12.5 MG TOTAL) BY MOUTH DAILY. 45 tablet 2  . Multiple Vitamins-Minerals (CENTRUM SILVER PO) Take 1 tablet by mouth daily.     . Omega-3 Fatty Acids (FISH OIL) 1200 MG CAPS Take 1 capsule by mouth at bedtime.     . simvastatin (ZOCOR) 40 MG tablet TAKE 1 TABLET BY MOUTH EVERY DAY 90 tablet 2  . UNABLE TO FIND Take 0.6 mg by mouth as needed. Med Name: Mitigare 0.3m24mprn daily    . MITIGARE 0.6 MG CAPS Take 0.6 mg by mouth daily. Changed to Brand name to see if insurance will cover MitMcGillatient taking differently: Take 0.6 mg by mouth daily as needed (Chest pain). Changed to Brand name to see if insurance will cover Mitigare) 90 capsule 1   No current facility-administered medications on file prior to visit.     Review of Systems:  As per HPI- otherwise negative. Fever chills, no chest pain or shortness of breath  Physical Examination: Vitals:   02/21/19 0900  BP: 130/82  Pulse: 74  Resp: 16  Temp: 98 F (36.7 C)  SpO2: 99%   Vitals:   02/21/19 0900   Weight: 192 lb (87.1 kg)  Height: 5' 4"  (1.626 m)   Body mass index is 32.96 kg/m. Ideal Body Weight: Weight  in (lb) to have BMI = 25: 145.3  GEN: WDWN, NAD, Non-toxic, A & O x 3, overweight, looks well HEENT: Atraumatic, Normocephalic. Neck supple. No masses, No LAD.  Bilateral TM wnl, oropharynx normal.  PEERL,EOMI. subtle scar on nose from Mohs surgery Ears and Nose: No external deformity. CV: RRR, No M/G/R. No JVD. No thrill. No extra heart sounds. PULM: CTA B, no wheezes, crackles, rhonchi. No retractions. No resp. distress. No accessory muscle use. ABD: S, NT, ND,. No rebound. No HSM. EXTR: No c/c/e NEURO Normal gait.  PSYCH: Normally interactive. Conversant. Not depressed or anxious appearing.  Calm demeanor.    Assessment and Plan: Class 1 obesity due to excess calories without serious comorbidity with body mass index (BMI) of 32.0 to 32.9 in adult  CAD S/P percutaneous coronary angioplasty  Other ulcerative colitis with complication [facility])  Here today to establish care.  We discussed his chronic health problems, he is seen all the appropriate specialist.  His labs are up-to-date His cardiac disease is being followed closely by cardiology, he has no angina. Blood pressures well controlled Cholesterol panel from last month looks good Shingrix, he would like to do this in the future but not today Invited and answered all questions.  He will see me in 6 months for a follow-up visit  Signed Lamar Blinks, MD

## 2019-02-21 ENCOUNTER — Ambulatory Visit (INDEPENDENT_AMBULATORY_CARE_PROVIDER_SITE_OTHER): Payer: 59 | Admitting: Family Medicine

## 2019-02-21 ENCOUNTER — Encounter: Payer: Self-pay | Admitting: Family Medicine

## 2019-02-21 VITALS — BP 130/82 | HR 74 | Temp 98.0°F | Resp 16 | Ht 64.0 in | Wt 192.0 lb

## 2019-02-21 DIAGNOSIS — I2510 Atherosclerotic heart disease of native coronary artery without angina pectoris: Secondary | ICD-10-CM

## 2019-02-21 DIAGNOSIS — Z23 Encounter for immunization: Secondary | ICD-10-CM | POA: Diagnosis not present

## 2019-02-21 DIAGNOSIS — K51819 Other ulcerative colitis with unspecified complications: Secondary | ICD-10-CM | POA: Diagnosis not present

## 2019-02-21 DIAGNOSIS — E6609 Other obesity due to excess calories: Secondary | ICD-10-CM | POA: Diagnosis not present

## 2019-02-21 DIAGNOSIS — Z6832 Body mass index (BMI) 32.0-32.9, adult: Secondary | ICD-10-CM

## 2019-02-21 DIAGNOSIS — Z9861 Coronary angioplasty status: Secondary | ICD-10-CM

## 2019-02-21 NOTE — Patient Instructions (Addendum)
It was a pleasure to see you today, I look forward to working with you in the future. Let's plan to visit in about 6 months for a physical-at that time we can start your shingles vaccine series Let me know if I can do nothing else to help him in the meantime You might try the thigh-high compression stockings for swelling in your left leg  Please schedule a skin cancer screening with dermatology

## 2019-03-02 DIAGNOSIS — R972 Elevated prostate specific antigen [PSA]: Secondary | ICD-10-CM | POA: Diagnosis not present

## 2019-03-27 DIAGNOSIS — C61 Malignant neoplasm of prostate: Secondary | ICD-10-CM | POA: Diagnosis not present

## 2019-03-27 DIAGNOSIS — R972 Elevated prostate specific antigen [PSA]: Secondary | ICD-10-CM | POA: Diagnosis not present

## 2019-04-03 DIAGNOSIS — R300 Dysuria: Secondary | ICD-10-CM | POA: Diagnosis not present

## 2019-04-03 DIAGNOSIS — C61 Malignant neoplasm of prostate: Secondary | ICD-10-CM | POA: Diagnosis not present

## 2019-04-04 ENCOUNTER — Other Ambulatory Visit (HOSPITAL_COMMUNITY): Payer: Self-pay | Admitting: Urology

## 2019-04-04 ENCOUNTER — Other Ambulatory Visit: Payer: Self-pay | Admitting: Urology

## 2019-04-04 DIAGNOSIS — C61 Malignant neoplasm of prostate: Secondary | ICD-10-CM

## 2019-04-06 DIAGNOSIS — K8020 Calculus of gallbladder without cholecystitis without obstruction: Secondary | ICD-10-CM | POA: Diagnosis not present

## 2019-04-06 DIAGNOSIS — C61 Malignant neoplasm of prostate: Secondary | ICD-10-CM | POA: Diagnosis not present

## 2019-04-10 ENCOUNTER — Ambulatory Visit (HOSPITAL_COMMUNITY)
Admission: RE | Admit: 2019-04-10 | Discharge: 2019-04-10 | Disposition: A | Discharge status: Home / Self Care | Payer: 59 | Source: Ambulatory Visit | Attending: Urology | Admitting: Urology

## 2019-04-10 ENCOUNTER — Other Ambulatory Visit: Payer: Self-pay

## 2019-04-10 DIAGNOSIS — C61 Malignant neoplasm of prostate: Secondary | ICD-10-CM | POA: Diagnosis not present

## 2019-04-10 MED ORDER — TECHNETIUM TC 99M MEDRONATE IV KIT
20.0000 | PACK | Freq: Once | INTRAVENOUS | Status: AC | PRN
  Administered 2019-04-10: 12:00:00 20 via INTRAVENOUS

## 2019-04-12 ENCOUNTER — Encounter: Payer: Self-pay | Admitting: Family Medicine

## 2019-04-12 DIAGNOSIS — C61 Malignant neoplasm of prostate: Secondary | ICD-10-CM | POA: Insufficient documentation

## 2019-04-16 DIAGNOSIS — N390 Urinary tract infection, site not specified: Secondary | ICD-10-CM | POA: Diagnosis not present

## 2019-04-16 DIAGNOSIS — C61 Malignant neoplasm of prostate: Secondary | ICD-10-CM | POA: Diagnosis not present

## 2019-04-16 DIAGNOSIS — B9620 Unspecified Escherichia coli [E. coli] as the cause of diseases classified elsewhere: Secondary | ICD-10-CM | POA: Diagnosis not present

## 2019-04-23 ENCOUNTER — Encounter: Payer: Self-pay | Admitting: Radiation Oncology

## 2019-04-23 NOTE — Progress Notes (Addendum)
GU Location of Tumor / Histology: prostatic adenocarcinoma  If Prostate Cancer, Gleason Score is (4 + 5) and PSA is (94.7).  Prostate volume: 46.  Branch Pacitti was found to have a PSA of 94.7 during a visit with his cardiologist in January 2020. Cardiologist collected PSA because patient had yet to establish care with a PCP. Patient originally from [place], [place].  Biopsies of prostate (if applicable) revealed:    Past/Anticipated interventions by urology, if any: biopsy, CT abd/pelvis (negative), bone scan (negative), referral to radiation oncology to discuss options  Past/Anticipated interventions by medical oncology, if any: no  Weight changes, if any: no  Bowel/Bladder complaints, if any: Reports having to strain to void. Reports dysuria. Given Augmentin for cystitis on 04/16/2019.Completed course of Augmentin 04/22/2019 afternoon.IPSS 1 SHIM 24  Nausea/Vomiting, if any: no  Pain issues, if any:  no  SAFETY ISSUES:  Prior radiation? no  Pacemaker/ICD? no  Possible current pregnancy? no, male patient  Is the patient on methotrexate? no  Current Complaints / other details:  63 year old male. Prefers to go by [employer]. Mother with hx of lymphoma. Married to a retired Marine scientist.

## 2019-04-24 ENCOUNTER — Encounter: Payer: Self-pay | Admitting: Radiation Oncology

## 2019-04-24 ENCOUNTER — Other Ambulatory Visit: Payer: Self-pay

## 2019-04-24 ENCOUNTER — Ambulatory Visit
Admission: RE | Admit: 2019-04-24 | Discharge: 2019-04-24 | Disposition: A | Discharge status: Home / Self Care | Payer: 59 | Source: Ambulatory Visit | Attending: Radiation Oncology | Admitting: Radiation Oncology

## 2019-04-24 VITALS — Ht 64.0 in | Wt 181.0 lb

## 2019-04-24 DIAGNOSIS — C61 Malignant neoplasm of prostate: Secondary | ICD-10-CM

## 2019-04-24 DIAGNOSIS — R972 Elevated prostate specific antigen [PSA]: Secondary | ICD-10-CM | POA: Diagnosis not present

## 2019-04-24 HISTORY — DX: Malignant neoplasm of prostate: C61

## 2019-04-24 NOTE — Progress Notes (Signed)
See progress note in physician encounter.

## 2019-04-24 NOTE — Progress Notes (Signed)
Radiation Oncology         [phone] ________________________________  Initial Outpatient Consultation - Conducted via WebEx due to current COVID-19 concerns for limiting patient exposure  Name: Tyler Estrada MRN: [id number]  Date: 04/24/2019  DOB: 10/13/1956  OA:[id number], Gay Filler, MD  Alexis Frock, MD   REFERRING PHYSICIAN: Alexis Frock, MD  DIAGNOSIS: 63 y.o. gentleman with Stage T1c adenocarcinoma of the prostate with Gleason score of 4+5, and PSA of 74.70.    ICD-10-CM   1. Malignant neoplasm of prostate ([place]) C61   2. Prostate cancer [facility]) C61     HISTORY OF PRESENT ILLNESS: Tyler Estrada "Tyler Estrada" is a 63 y.o. male with a diagnosis of prostate cancer. He was noted to have an elevated PSA of 94.7 in January 2020 by his cardiologist, Dr. Dorris Carnes.  Dr. Harrington Challenger collected PSA because the patient had yet to establish care with a primary care provider, since he relocated here from [place], [place] 6 years ago. Accordingly, he was referred for evaluation in urology with Dr. Tresa Moore on 01/17/2019, and a digital rectal examination was performed at that time revealing a firm but mobile prostate.  At that time, the patient wished to have his PSA rechecked prior to proceeding with further evaluation as his previous PSA readings had always been normal to his knowledge.  A repeat PSA on 02/12/2019 remained significantly elevated at 74.70. Therefore, the patient proceeded to transrectal ultrasound with 12 biopsies of the prostate on 03/27/2019.  The prostate volume measured 46 cc.  Out of 12 core biopsies, all 12 were positive.  The maximum Gleason score was 4+5, and this was seen in the right mid, right apex, right base lateral, right mid lateral, and right apex lateral. Additionally, Gleason 4+4 disease was seen in the right base, Gleason 3+4 was seen in the left base lateral, left mid lateral, left apex lateral, left base, and left mid and Gleason 3+3 was in the left apex.   Biopsies of prostate  revealed:    Disease staging studies include CT of the abdomen/pelvis on 4/17 and a bone scan on 04/10/19 which, fortunately, were both negative for visceral or osseous metastatic disease.  The patient reviewed the biopsy and imaging results with his urologist and he has kindly been referred today for discussion of potential radiation treatment options.  PREVIOUS RADIATION THERAPY: No  PAST MEDICAL HISTORY:  Past Medical History:  Diagnosis Date   Arthritis    knees, hips, hands   Complication of anesthesia    Woke up during hip replacement   Coronary artery disease    a. s/p DES to RCA in 2008;  b. 10/2015 Cath: LM nl, LAD 50ost/p, OM1/2 min irregs, RCA 64mISR, 327m ISR, EF 65%.   Echocardiogram    Echo 11/16: EF 60-65, normal wall motion, grade 1 diastolic dysfunction   Grade I diastolic dysfunction 1101/60/1093 ECHO   Hyperlipidemia    Iron deficiency anemia due to chronic blood loss 09/10/2015   Melanoma (HCC)    Nose   Pericarditis    a. 10/2015->colchicine.   Prostate cancer ([facility]   Ulcerative colitis with complication ([id number]/2/35/5732    PAST SURGICAL HISTORY: Past Surgical History:  Procedure Laterality Date   CARDIAC CATHETERIZATION N/A 11/18/2015   Procedure: Left Heart Cath and Coronary Angiography;  Surgeon: MiSherren MochaMD;  [facility];  Service: Cardiovascular;  Laterality: N/A;   COLONOSCOPY  2019   CORONARY ANGIOPLASTY WITH STENT PLACEMENT  2008   a. Cordis stent to RCA in 2008 2.37m x 136m  HIP ARTHROPLASTY Right    MOHS SURGERY     PARTIAL KNEE ARTHROPLASTY Left 11/30/2018   Procedure: LEFT UNICOMPARTMENTAL KNEE Medially;  Surgeon: OlParalee CancelMD;  Location: [facility];  Service: Orthopedics;  Laterality: Left;  70 mins with block   TONSILLECTOMY     childhood    FAMILY HISTORY:  Family History  Problem Relation Age of Onset   Lymphoma Mother    Hypertension Mother    Arthritis Mother    Cancer  Mother    CAD Father    Heart disease Father    Heart attack Father    Arthritis Sister    Heart disease Sister    Hypertension Brother     SOCIAL HISTORY:  Social History   Socioeconomic History   Marital status: Married    Spouse name: Not on file   Number of children: 0   Years of education: Not on file   Highest education level: Not on file  Occupational History    Comment: full time  Social NeDesigner, fashion/clothingtrain: Not on file   Food insecurity:    Worry: Not on file    Inability: Not on file   Transportation needs:    Medical: Not on file    Non-medical: Not on file  Tobacco Use   Smoking status: Never Smoker   Smokeless tobacco: Never Used  Substance and Sexual Activity   Alcohol use: Yes    Alcohol/week: 0.0 standard drinks    Comment: 1-2 drinks per week.   Drug use: No   Sexual activity: Not on file  Lifestyle   Physical activity:    Days per week: Not on file    Minutes per session: Not on file   Stress: Not on file  Relationships   Social connections:    Talks on phone: Not on file    Gets together: Not on file    Attends religious service: Not on file    Active member of club or organization: Not on file    Attends meetings of clubs or organizations: Not on file    Relationship status: Not on file   Intimate partner violence:    Fear of current or ex partner: Not on file    Emotionally abused: Not on file    Physically abused: Not on file    Forced sexual activity: Not on file  Other Topics Concern   Not on file  Social History Narrative   Not on file    ALLERGIES: Prednisone  MEDICATIONS:  Current Outpatient Medications  Medication Sig Dispense Refill   aspirin EC 81 MG tablet Take 81 mg by mouth daily.     cetirizine (ZYRTEC) 10 MG tablet Take 10 mg by mouth daily.     mesalamine (LIALDA) 1.2 g EC tablet Take by mouth daily with breakfast. Is taking one tablet 2 times per day     metoprolol  tartrate (LOPRESSOR) 25 MG tablet TAKE 0.5 TABLETS (12.5 MG TOTAL) BY MOUTH DAILY. 45 tablet 2   MITIGARE 0.6 MG CAPS Take 0.6 mg by mouth daily. Changed to Brand name to see if insurance will cover MiChupaderoPatient taking differently: Take 0.6 mg by mouth daily as needed (Chest pain). Changed to Brand name to see if insurance will cover Mitigare) 90 capsule 1   Multiple Vitamins-Minerals (CENTRUM SILVER PO) Take 1 tablet by mouth daily.  Omega-3 Fatty Acids (FISH OIL) 1200 MG CAPS Take 1 capsule by mouth at bedtime.      simvastatin (ZOCOR) 40 MG tablet TAKE 1 TABLET BY MOUTH EVERY DAY 90 tablet 2   No current facility-administered medications for this encounter.     REVIEW OF SYSTEMS:  On review of systems, the patient reports that he is doing well overall. He denies any chest pain, shortness of breath, cough, fevers, chills, night sweats, or unintended weight changes. He denies any bowel disturbances, and denies abdominal pain, nausea or vomiting. He denies any new musculoskeletal or joint aches or pains. His IPSS was 1, indicating mild urinary symptoms with straining. He reports recent dysuria and was given Augmentin for cystitis on 04/16/2019, which he completed on 04/22/2019. His SHIM was 24, indicating he does not have erectile dysfunction. A complete review of systems is obtained and is otherwise negative.    PHYSICAL EXAM:  Wt Readings from Last 3 Encounters:  04/24/19 181 lb (82.1 kg)  02/21/19 192 lb (87.1 kg)  01/15/19 186 lb 12.8 oz (84.7 kg)   Temp Readings from Last 3 Encounters:  02/21/19 98 F (36.7 C) (Oral)  12/01/18 98 F (36.7 C) (Oral)  11/15/18 (P) 98.4 F (36.9 C) (Oral)   BP Readings from Last 3 Encounters:  02/21/19 130/82  01/15/19 118/74  12/01/18 119/71   Pulse Readings from Last 3 Encounters:  02/21/19 74  01/15/19 72  12/01/18 79   Pain Assessment Pain Score: 0-No pain/10  In general this is a well appearing caucasian male in no acute  distress. He's alert and oriented x4 and appropriate throughout the encounter. Cardiopulmonary assessment is negative for acute distress and he exhibits normal effort. Remainder of exam not performed in light of virtual consultation platform.   LABORATORY DATA:  Lab Results  Component Value Date   WBC 5.6 01/15/2019   HGB 12.8 (L) 01/15/2019   HCT 38.1 01/15/2019   MCV 86 01/15/2019   PLT 277 01/15/2019   Lab Results  Component Value Date   NA 141 12/01/2018   K 3.7 12/01/2018   CL 112 (H) 12/01/2018   CO2 22 12/01/2018   Lab Results  Component Value Date   ALT 24 10/04/2018   AST 25 10/04/2018   ALKPHOS 82 10/04/2018   BILITOT 0.5 10/04/2018     RADIOGRAPHY: Nm Bone Scan Whole Body  Result Date: 04/10/2019 CLINICAL DATA:  Prostate cancer. LEFT knee replacement RIGHT hip surgery. No trauma. EXAM: NUCLEAR MEDICINE WHOLE BODY BONE SCAN TECHNIQUE: Whole body anterior and posterior images were obtained approximately 3 hours after intravenous injection of radiopharmaceutical. RADIOPHARMACEUTICALS:  22.0 mCi Technetium-45mMDP IV COMPARISON:  CT abdomen pelvis 04/06/19 FINDINGS: No focal uptake within the axial or appendicular skeleton suggest prostate cancer metastasis. Uptake in the medial LEFT knee suggest hemiarthroplasty. Sacrum partially obscured by urine in bladder. IMPRESSION: No scintigraphic evidence skeletal metastasis. Electronically Signed   By: SSuzy BouchardM.D.   On: 04/10/2019 16:18      IMPRESSION/PLAN:  1. 63y.o. gentleman with Stage T1c adenocarcinoma of the prostate with Gleason Score of 4+5, and PSA of 74.70. We discussed the patient's workup and outlined the nature of prostate cancer in this setting. The patient's T stage, Gleason's score, and PSA put him into the high risk group. Accordingly, he is eligible for a variety of potential treatment options including long-term androgen deprivation therapy (ADT) in combination with either 8 weeks of external radiation  or 5 weeks of  external radiation proceeded by a brachytherapy boost. We discussed the available radiation techniques, and focused on the details and logistics and delivery. We discussed and outlined the risks, benefits, short and long-term effects associated with radiotherapy and compared and contrasted these with prostatectomy. We discussed the role of SpaceOAR in reducing the rectal toxicity associated with radiotherapy. We also detailed the role of ADT in the treatment of high risk prostate cancer and outlined the associated side effects that could be expected with this therapy.   We also discussed some of the potential advantages of surgery including surgical staging, the availability of salvage radiotherapy to the prostatic fossa, and the confidence associated with immediate biochemical response. We discussed some of the potential proven indications for postoperative radiotherapy including positive margins, extracapsular extension, and seminal vesicle involvement. We also talked about some of the other potential findings leading to a recommendation for radiotherapy including a non-zero postoperative PSA and positive lymph nodes.    At the end of the conversation the patient appears to be leaning towards proceeding with LT-ADT in combination with an upfront brachytherapy boost with SpaceOAR gel placement followed by a 5 week course of daily radiotherapy.  He plans to call us in the next 1-2 days to confirm his decision and we will share our discussion with Dr. Tresa Moore at that time. If he elects to proceed with radiotherapy as above, we will move forward with coordinating the start of ADT now, prior to scheduling his pre-seed CT [facility] planning appointment, in anticipation of scheduling his seed boost procedure to occur two months after the start of ADT.  Romie Jumper in our office will be working closely with him to coordinate OR scheduling and pre and post procedure appointments.  We will contact the  pharmaceutical rep to ensure that [place] is available at the time of procedure.  He will have a prostate MRI following his post-seed CT SIM to confirm appropriate distribution of the [place].  He will begin the 5 week course of daily radiotherapy approximately 3 weeks after his brachytherapy/seed boost procedure.  This encounter was provided by telemedicine platform Webex.  The patient has given verbal consent for this type of encounter and has been advised to only accept a meeting of this type in a secure network environment. The time spent during this encounter was 80 minutes. The attendants for this meeting include Tyler Pita MD, Ashlyn Bruning PA-C, scribe [place], and patient Bayler Gehrig and wife, Jenny Reichmann.  During the encounter, Tyler Pita MD, Ashlyn Bruning PA-C, and scribe Freeman Caldron were located at [facility] Radiation Oncology Department.  Patient, Kentaro Alewine and wife, Jenny Reichmann were located at home.     Nicholos Johns, PA-C    Tyler Pita, MD  [facility] Direct Dial: [phone]   Fax: [phone] [email or web address]   Skype   LinkedIn  This document serves as a record of services personally performed by Tyler Pita, MD and Freeman Caldron, PA-C. It was created on their behalf by Rae Lips, a trained medical scribe. The creation of this record is based on the scribe's personal observations and the providers' statements to them. This document has been checked and approved by the attending providers.

## 2019-04-26 ENCOUNTER — Telehealth: Payer: Self-pay | Admitting: Radiation Oncology

## 2019-04-26 ENCOUNTER — Telehealth: Payer: Self-pay | Admitting: *Deleted

## 2019-04-26 NOTE — Telephone Encounter (Signed)
Received voicemail message from patient expressing his desire to move forward with radiation treatment. Sent inbasket message detailing such to Dr. Tammi Klippel, Vincent Gros, PA, Cira Rue, RN, and Romie Jumper. Phoned patient back. Reassured him I received his message and triggered the appropriate staff. Explained he should receive a call from Romie Jumper with his appointment to receive ADT with Dr. Tresa Moore. Provided patient with my direct contact number for future needs or questions. Patient verbalized understanding of all reviewed.

## 2019-04-26 NOTE — Telephone Encounter (Signed)
Called patient to inform of ADT appt. being moved up to 05-08-19 - arrival time - 9:15 am @ Dr. Zettie Pho Office spoke with patient and he is aware of this appt.

## 2019-04-26 NOTE — Telephone Encounter (Signed)
CALLED PATIENT TO INFORM OF ADT INJ. ON 05-22-19 - ARRIVAL TIME - 10:15 AM @ DR. MANNY'S OFFICE, SPOKE WITH PATIENT AND HE IS AWARE OF THIS APPT.

## 2019-04-30 ENCOUNTER — Telehealth: Payer: Self-pay | Admitting: Medical Oncology

## 2019-04-30 NOTE — Telephone Encounter (Signed)
Spoke with Tyler Estrada to introduce myself as the prostate nurse navigator and my role. I was unable to meet him when he consulted with Dr. Tammi Klippel 5/5 due to COVID-19 restrictions. He states the visit went very well. He and reviewed and discussed the treatment plan in detail. He confirmed his appointment 5/19 at 9:15 for hormone injection. I asked him to call with questions or concerns. He voiced understanding.

## 2019-05-08 ENCOUNTER — Encounter: Payer: Self-pay | Admitting: Medical Oncology

## 2019-05-08 DIAGNOSIS — C61 Malignant neoplasm of prostate: Secondary | ICD-10-CM | POA: Diagnosis not present

## 2019-05-08 DIAGNOSIS — R300 Dysuria: Secondary | ICD-10-CM | POA: Diagnosis not present

## 2019-05-22 ENCOUNTER — Telehealth: Payer: Self-pay | Admitting: *Deleted

## 2019-05-22 NOTE — Telephone Encounter (Signed)
Returned patient's phone call, spoke with patient

## 2019-05-25 ENCOUNTER — Telehealth: Payer: Self-pay | Admitting: *Deleted

## 2019-05-25 NOTE — Telephone Encounter (Signed)
CALLED PATIENT TO INFORM OF IMPLANT DATE, SPOKE WITH PATIENT

## 2019-06-08 ENCOUNTER — Other Ambulatory Visit: Payer: Self-pay | Admitting: Urology

## 2019-07-05 ENCOUNTER — Other Ambulatory Visit: Payer: Self-pay | Admitting: Urology

## 2019-07-05 DIAGNOSIS — C61 Malignant neoplasm of prostate: Secondary | ICD-10-CM

## 2019-07-11 ENCOUNTER — Telehealth: Payer: Self-pay | Admitting: *Deleted

## 2019-07-11 NOTE — Telephone Encounter (Signed)
CALLED PATIENT TO REMIND OF PRE-SEED APPTS. FOR 07-12-19, SPOKE WITH PATIENT AND HE IS AWARE OF THESE APPTS.

## 2019-07-12 ENCOUNTER — Encounter (HOSPITAL_COMMUNITY)
Admission: RE | Admit: 2019-07-12 | Discharge: 2019-07-12 | Disposition: A | Discharge status: Home / Self Care | Payer: 59 | Source: Ambulatory Visit | Attending: Orthopedic Surgery | Admitting: Orthopedic Surgery

## 2019-07-12 ENCOUNTER — Ambulatory Visit
Admission: RE | Admit: 2019-07-12 | Discharge: 2019-07-12 | Disposition: A | Discharge status: Home / Self Care | Payer: 59 | Source: Ambulatory Visit | Attending: Radiation Oncology | Admitting: Radiation Oncology

## 2019-07-12 ENCOUNTER — Other Ambulatory Visit: Payer: Self-pay

## 2019-07-12 ENCOUNTER — Ambulatory Visit (HOSPITAL_COMMUNITY)
Admission: RE | Admit: 2019-07-12 | Discharge: 2019-07-12 | Disposition: A | Discharge status: Home / Self Care | Payer: 59 | Source: Ambulatory Visit | Attending: Urology | Admitting: Urology

## 2019-07-12 ENCOUNTER — Ambulatory Visit
Admission: RE | Admit: 2019-07-12 | Discharge: 2019-07-12 | Disposition: A | Discharge status: Home / Self Care | Payer: 59 | Source: Ambulatory Visit | Attending: Urology | Admitting: Urology

## 2019-07-12 DIAGNOSIS — Z01818 Encounter for other preprocedural examination: Secondary | ICD-10-CM | POA: Insufficient documentation

## 2019-07-12 DIAGNOSIS — C61 Malignant neoplasm of prostate: Secondary | ICD-10-CM | POA: Insufficient documentation

## 2019-07-12 NOTE — Progress Notes (Signed)
  Radiation Oncology         [phone]) [phone] ________________________________  Name: Tyler Estrada MRN: [id number]  Date: 07/12/2019  DOB: 1956-03-14  SIMULATION AND TREATMENT PLANNING NOTE PUBIC ARCH STUDY  YW:VXUCJAR, Gay Filler, MD  [place], Gay Filler, MD  DIAGNOSIS: 63 y.o. gentleman with Stage T1c adenocarcinoma of the prostate with Gleason score of 4+5, and PSA of 74.70.     ICD-10-CM   1. Prostate cancer ([place])  C61     COMPLEX SIMULATION:  The patient presented today for evaluation for possible prostate seed implant. He was brought to the radiation planning suite and placed supine on the CT couch. A 3-dimensional image study set was obtained in upload to the planning computer. There, on each axial slice, I contoured the prostate gland. Then, using three-dimensional radiation planning tools I reconstructed the prostate in view of the structures from the transperineal needle pathway to assess for possible pubic arch interference. In doing so, I did not appreciate any pubic arch interference. Also, the patient's prostate volume was estimated based on the drawn structure. The volume was 40 cc. This is less than the TRUS vol of 46 cc, related to ADT.  Given the pubic arch appearance and prostate volume, patient remains a good candidate to proceed with prostate seed implant. Today, he freely provided informed written consent to proceed.    PLAN: The patient will undergo prostate seed implant.   ________________________________  Sheral Apley. Tammi Klippel, M.D.

## 2019-08-02 ENCOUNTER — Telehealth: Payer: Self-pay | Admitting: *Deleted

## 2019-08-02 NOTE — Telephone Encounter (Signed)
Returned patient's phone call, spoke with patient

## 2019-08-07 ENCOUNTER — Other Ambulatory Visit (HOSPITAL_COMMUNITY)
Admission: RE | Admit: 2019-08-07 | Discharge: 2019-08-07 | Disposition: A | Discharge status: Home / Self Care | Payer: 59 | Source: Ambulatory Visit | Attending: Urology | Admitting: Urology

## 2019-08-07 ENCOUNTER — Other Ambulatory Visit: Payer: Self-pay

## 2019-08-07 ENCOUNTER — Encounter (HOSPITAL_COMMUNITY)
Admission: RE | Admit: 2019-08-07 | Discharge: 2019-08-07 | Disposition: A | Discharge status: Home / Self Care | Payer: 59 | Source: Ambulatory Visit | Attending: Urology | Admitting: Urology

## 2019-08-07 DIAGNOSIS — C61 Malignant neoplasm of prostate: Secondary | ICD-10-CM | POA: Diagnosis present

## 2019-08-07 DIAGNOSIS — Z96652 Presence of left artificial knee joint: Secondary | ICD-10-CM | POA: Diagnosis not present

## 2019-08-07 DIAGNOSIS — Z85820 Personal history of malignant melanoma of skin: Secondary | ICD-10-CM | POA: Diagnosis not present

## 2019-08-07 DIAGNOSIS — Z8261 Family history of arthritis: Secondary | ICD-10-CM | POA: Diagnosis not present

## 2019-08-07 DIAGNOSIS — M160 Bilateral primary osteoarthritis of hip: Secondary | ICD-10-CM | POA: Diagnosis not present

## 2019-08-07 DIAGNOSIS — Z888 Allergy status to other drugs, medicaments and biological substances status: Secondary | ICD-10-CM | POA: Diagnosis not present

## 2019-08-07 DIAGNOSIS — Z833 Family history of diabetes mellitus: Secondary | ICD-10-CM | POA: Diagnosis not present

## 2019-08-07 DIAGNOSIS — E785 Hyperlipidemia, unspecified: Secondary | ICD-10-CM | POA: Diagnosis not present

## 2019-08-07 DIAGNOSIS — Z7982 Long term (current) use of aspirin: Secondary | ICD-10-CM | POA: Diagnosis not present

## 2019-08-07 DIAGNOSIS — K279 Peptic ulcer, site unspecified, unspecified as acute or chronic, without hemorrhage or perforation: Secondary | ICD-10-CM | POA: Diagnosis not present

## 2019-08-07 DIAGNOSIS — M19042 Primary osteoarthritis, left hand: Secondary | ICD-10-CM | POA: Diagnosis not present

## 2019-08-07 DIAGNOSIS — Z01812 Encounter for preprocedural laboratory examination: Secondary | ICD-10-CM | POA: Insufficient documentation

## 2019-08-07 DIAGNOSIS — Z807 Family history of other malignant neoplasms of lymphoid, hematopoietic and related tissues: Secondary | ICD-10-CM | POA: Diagnosis not present

## 2019-08-07 DIAGNOSIS — Z20828 Contact with and (suspected) exposure to other viral communicable diseases: Secondary | ICD-10-CM | POA: Diagnosis not present

## 2019-08-07 DIAGNOSIS — K5190 Ulcerative colitis, unspecified, without complications: Secondary | ICD-10-CM | POA: Diagnosis not present

## 2019-08-07 DIAGNOSIS — M170 Bilateral primary osteoarthritis of knee: Secondary | ICD-10-CM | POA: Diagnosis not present

## 2019-08-07 DIAGNOSIS — I2510 Atherosclerotic heart disease of native coronary artery without angina pectoris: Secondary | ICD-10-CM | POA: Diagnosis not present

## 2019-08-07 DIAGNOSIS — Z8249 Family history of ischemic heart disease and other diseases of the circulatory system: Secondary | ICD-10-CM | POA: Diagnosis not present

## 2019-08-07 DIAGNOSIS — Z79899 Other long term (current) drug therapy: Secondary | ICD-10-CM | POA: Diagnosis not present

## 2019-08-07 DIAGNOSIS — Z809 Family history of malignant neoplasm, unspecified: Secondary | ICD-10-CM | POA: Diagnosis not present

## 2019-08-07 DIAGNOSIS — D649 Anemia, unspecified: Secondary | ICD-10-CM | POA: Diagnosis not present

## 2019-08-07 DIAGNOSIS — Z955 Presence of coronary angioplasty implant and graft: Secondary | ICD-10-CM | POA: Diagnosis not present

## 2019-08-07 DIAGNOSIS — M19041 Primary osteoarthritis, right hand: Secondary | ICD-10-CM | POA: Diagnosis not present

## 2019-08-07 LAB — CBC
HCT: 39.4 % (ref 39.0–52.0)
Hemoglobin: 12.6 g/dL — ABNORMAL LOW (ref 13.0–17.0)
MCH: 28.6 pg (ref 26.0–34.0)
MCHC: 32 g/dL (ref 30.0–36.0)
MCV: 89.3 fL (ref 80.0–100.0)
Platelets: 240 10*3/uL (ref 150–400)
RBC: 4.41 MIL/uL (ref 4.22–5.81)
RDW: 13.7 % (ref 11.5–15.5)
WBC: 5.1 10*3/uL (ref 4.0–10.5)
nRBC: 0 % (ref 0.0–0.2)

## 2019-08-07 LAB — COMPREHENSIVE METABOLIC PANEL
ALT: 30 U/L (ref 0–44)
AST: 29 U/L (ref 15–41)
Albumin: 3.9 g/dL (ref 3.5–5.0)
Alkaline Phosphatase: 85 U/L (ref 38–126)
Anion gap: 7 (ref 5–15)
BUN: 12 mg/dL (ref 8–23)
CO2: 25 mmol/L (ref 22–32)
Calcium: 9 mg/dL (ref 8.9–10.3)
Chloride: 110 mmol/L (ref 98–111)
Creatinine, Ser: 0.74 mg/dL (ref 0.61–1.24)
GFR calc Af Amer: >60 mL/min (ref >60)
GFR calc non Af Amer: >60 mL/min (ref >60)
Glucose, Bld: 116 mg/dL — ABNORMAL HIGH (ref 70–99)
Potassium: 4.2 mmol/L (ref 3.5–5.1)
Sodium: 142 mmol/L (ref 135–145)
Total Bilirubin: 0.4 mg/dL (ref 0.3–1.2)
Total Protein: 7 g/dL (ref 6.5–8.1)

## 2019-08-07 LAB — SARS CORONAVIRUS 2 (TAT 6-24 HRS): SARS Coronavirus 2: NEGATIVE

## 2019-08-07 LAB — PROTIME-INR
INR: 0.8 (ref 0.8–1.2)
Prothrombin Time: 11.3 seconds — ABNORMAL LOW (ref 11.4–15.2)

## 2019-08-07 LAB — APTT: aPTT: 25 seconds (ref 24–36)

## 2019-08-08 ENCOUNTER — Other Ambulatory Visit: Payer: Self-pay

## 2019-08-08 ENCOUNTER — Encounter (HOSPITAL_BASED_OUTPATIENT_CLINIC_OR_DEPARTMENT_OTHER): Payer: Self-pay | Admitting: *Deleted

## 2019-08-08 NOTE — Progress Notes (Signed)
Arrive 530 08-10-19 [facility] Meds to take sip of water: certrizine, simvastatin, metoprolol tartrate Fleets enema am of surgery Records on chart/epic: Ekg 10-04-18 Chest xray 07-12-19 Lov dr Harrington Challenger cardiology 01-15-19 Cbc, cmet, pt, ptt 08-07-19 Has surgery orders in epic Driver wife cindy cell [phone]

## 2019-08-09 ENCOUNTER — Encounter: Payer: Self-pay | Admitting: Family Medicine

## 2019-08-09 ENCOUNTER — Telehealth: Payer: Self-pay | Admitting: *Deleted

## 2019-08-09 NOTE — Anesthesia Preprocedure Evaluation (Addendum)
Anesthesia Evaluation  Patient identified by MRN, date of birth, ID band Patient awake    Reviewed: Allergy & Precautions, NPO status , Patient's Chart, lab work & pertinent test results  Airway Mallampati: II  TM Distance: >3 FB Neck ROM: Full    Dental no notable dental hx. (+) Teeth Intact   Pulmonary neg pulmonary ROS,    Pulmonary exam normal breath sounds clear to auscultation       Cardiovascular Exercise Tolerance: Good + CAD and + Cardiac Stents  Normal cardiovascular exam Rhythm:Regular Rate:Normal     Neuro/Psych negative neurological ROS  negative psych ROS   GI/Hepatic Neg liver ROS, PUD,   Endo/Other    Renal/GU negative Renal ROS     Musculoskeletal  (+) Arthritis ,   Abdominal   Peds  Hematology  (+) anemia ,   Anesthesia Other Findings   Reproductive/Obstetrics                            Anesthesia Physical Anesthesia Plan  ASA: III  Anesthesia Plan: General   Post-op Pain Management:    Induction: Intravenous  PONV Risk Score and Plan: 3 and Treatment may vary due to age or medical condition, Ondansetron and Dexamethasone  Airway Management Planned: LMA  Additional Equipment:   Intra-op Plan:   Post-operative Plan:   Informed Consent: I have reviewed the patients History and Physical, chart, labs and discussed the procedure including the risks, benefits and alternatives for the proposed anesthesia with the patient or authorized representative who has indicated his/her understanding and acceptance.     Dental advisory given  Plan Discussed with:   Anesthesia Plan Comments:        Anesthesia Quick Evaluation

## 2019-08-09 NOTE — Telephone Encounter (Signed)
CALLED PATIENT TO REMIND OF PROCEDURE FOR 08-10-19, SPOKE WITH PATIENT AND HE IS AWARE OF THIS PROCEDURE

## 2019-08-10 ENCOUNTER — Encounter (HOSPITAL_BASED_OUTPATIENT_CLINIC_OR_DEPARTMENT_OTHER): Payer: Self-pay

## 2019-08-10 ENCOUNTER — Ambulatory Visit (HOSPITAL_COMMUNITY): Payer: 59

## 2019-08-10 ENCOUNTER — Ambulatory Visit (HOSPITAL_BASED_OUTPATIENT_CLINIC_OR_DEPARTMENT_OTHER)
Admission: RE | Admit: 2019-08-10 | Discharge: 2019-08-10 | Disposition: A | Discharge status: Home / Self Care | Payer: 59 | Source: Other Acute Inpatient Hospital | Attending: Urology | Admitting: Urology

## 2019-08-10 ENCOUNTER — Ambulatory Visit (HOSPITAL_BASED_OUTPATIENT_CLINIC_OR_DEPARTMENT_OTHER): Payer: 59 | Admitting: Anesthesiology

## 2019-08-10 ENCOUNTER — Ambulatory Visit (HOSPITAL_BASED_OUTPATIENT_CLINIC_OR_DEPARTMENT_OTHER): Payer: 59 | Admitting: Physician Assistant

## 2019-08-10 ENCOUNTER — Encounter (HOSPITAL_BASED_OUTPATIENT_CLINIC_OR_DEPARTMENT_OTHER)
Admission: RE | Disposition: A | Discharge status: Home / Self Care | Payer: Self-pay | Source: Other Acute Inpatient Hospital | Attending: Urology

## 2019-08-10 DIAGNOSIS — Z807 Family history of other malignant neoplasms of lymphoid, hematopoietic and related tissues: Secondary | ICD-10-CM | POA: Insufficient documentation

## 2019-08-10 DIAGNOSIS — Z955 Presence of coronary angioplasty implant and graft: Secondary | ICD-10-CM | POA: Insufficient documentation

## 2019-08-10 DIAGNOSIS — Z8249 Family history of ischemic heart disease and other diseases of the circulatory system: Secondary | ICD-10-CM | POA: Insufficient documentation

## 2019-08-10 DIAGNOSIS — Z96652 Presence of left artificial knee joint: Secondary | ICD-10-CM | POA: Insufficient documentation

## 2019-08-10 DIAGNOSIS — Z888 Allergy status to other drugs, medicaments and biological substances status: Secondary | ICD-10-CM | POA: Insufficient documentation

## 2019-08-10 DIAGNOSIS — C61 Malignant neoplasm of prostate: Secondary | ICD-10-CM | POA: Insufficient documentation

## 2019-08-10 DIAGNOSIS — M19042 Primary osteoarthritis, left hand: Secondary | ICD-10-CM | POA: Insufficient documentation

## 2019-08-10 DIAGNOSIS — D649 Anemia, unspecified: Secondary | ICD-10-CM | POA: Insufficient documentation

## 2019-08-10 DIAGNOSIS — Z8261 Family history of arthritis: Secondary | ICD-10-CM | POA: Insufficient documentation

## 2019-08-10 DIAGNOSIS — M170 Bilateral primary osteoarthritis of knee: Secondary | ICD-10-CM | POA: Insufficient documentation

## 2019-08-10 DIAGNOSIS — Z01818 Encounter for other preprocedural examination: Secondary | ICD-10-CM

## 2019-08-10 DIAGNOSIS — Z809 Family history of malignant neoplasm, unspecified: Secondary | ICD-10-CM | POA: Insufficient documentation

## 2019-08-10 DIAGNOSIS — K279 Peptic ulcer, site unspecified, unspecified as acute or chronic, without hemorrhage or perforation: Secondary | ICD-10-CM | POA: Insufficient documentation

## 2019-08-10 DIAGNOSIS — Z7982 Long term (current) use of aspirin: Secondary | ICD-10-CM | POA: Insufficient documentation

## 2019-08-10 DIAGNOSIS — K5190 Ulcerative colitis, unspecified, without complications: Secondary | ICD-10-CM | POA: Insufficient documentation

## 2019-08-10 DIAGNOSIS — M160 Bilateral primary osteoarthritis of hip: Secondary | ICD-10-CM | POA: Insufficient documentation

## 2019-08-10 DIAGNOSIS — Z833 Family history of diabetes mellitus: Secondary | ICD-10-CM | POA: Insufficient documentation

## 2019-08-10 DIAGNOSIS — Z79899 Other long term (current) drug therapy: Secondary | ICD-10-CM | POA: Insufficient documentation

## 2019-08-10 DIAGNOSIS — I2510 Atherosclerotic heart disease of native coronary artery without angina pectoris: Secondary | ICD-10-CM | POA: Insufficient documentation

## 2019-08-10 DIAGNOSIS — Z20828 Contact with and (suspected) exposure to other viral communicable diseases: Secondary | ICD-10-CM | POA: Insufficient documentation

## 2019-08-10 DIAGNOSIS — Z85820 Personal history of malignant melanoma of skin: Secondary | ICD-10-CM | POA: Insufficient documentation

## 2019-08-10 DIAGNOSIS — E785 Hyperlipidemia, unspecified: Secondary | ICD-10-CM | POA: Insufficient documentation

## 2019-08-10 DIAGNOSIS — M19041 Primary osteoarthritis, right hand: Secondary | ICD-10-CM | POA: Insufficient documentation

## 2019-08-10 HISTORY — PX: SPACE OAR INSTILLATION: SHX6769

## 2019-08-10 HISTORY — PX: RADIOACTIVE SEED IMPLANT: SHX5150

## 2019-08-10 SURGERY — INSERTION, RADIATION SOURCE, PROSTATE
Anesthesia: General | Site: Prostate

## 2019-08-10 MED ORDER — CIPROFLOXACIN IN D5W 400 MG/200ML IV SOLN
400.0000 mg | INTRAVENOUS | Status: AC
  Administered 2019-08-10: 400 mg via INTRAVENOUS
  Filled 2019-08-10: qty 200

## 2019-08-10 MED ORDER — SODIUM CHLORIDE (PF) 0.9 % IJ SOLN
INTRAMUSCULAR | Status: DC | PRN
  Administered 2019-08-10: 10 mL

## 2019-08-10 MED ORDER — FENTANYL CITRATE (PF) 100 MCG/2ML IJ SOLN
INTRAMUSCULAR | Status: DC | PRN
  Administered 2019-08-10: 50 ug via INTRAVENOUS
  Administered 2019-08-10 (×2): 25 ug via INTRAVENOUS

## 2019-08-10 MED ORDER — LIDOCAINE 2% (20 MG/ML) 5 ML SYRINGE
INTRAMUSCULAR | Status: AC
  Filled 2019-08-10: qty 5

## 2019-08-10 MED ORDER — SODIUM CHLORIDE FLUSH 0.9 % IV SOLN
INTRAVENOUS | Status: DC | PRN
  Administered 2019-08-10: 3 mL

## 2019-08-10 MED ORDER — PROPOFOL 10 MG/ML IV BOLUS
INTRAVENOUS | Status: AC
  Filled 2019-08-10: qty 20

## 2019-08-10 MED ORDER — FENTANYL CITRATE (PF) 100 MCG/2ML IJ SOLN
INTRAMUSCULAR | Status: AC
  Filled 2019-08-10: qty 2

## 2019-08-10 MED ORDER — MIDAZOLAM HCL 5 MG/5ML IJ SOLN
INTRAMUSCULAR | Status: DC | PRN
  Administered 2019-08-10 (×2): 1 mg via INTRAVENOUS

## 2019-08-10 MED ORDER — SENNOSIDES-DOCUSATE SODIUM 8.6-50 MG PO TABS: 1.0000 | ORAL_TABLET | Freq: Two times a day (BID) | ORAL | 0 refills | Status: DC

## 2019-08-10 MED ORDER — LIDOCAINE HCL (CARDIAC) PF 100 MG/5ML IV SOSY
PREFILLED_SYRINGE | INTRAVENOUS | Status: DC | PRN
  Administered 2019-08-10: 80 mg via INTRAVENOUS

## 2019-08-10 MED ORDER — ONDANSETRON HCL 4 MG/2ML IJ SOLN
INTRAMUSCULAR | Status: AC
  Filled 2019-08-10: qty 2

## 2019-08-10 MED ORDER — MIDAZOLAM HCL 2 MG/2ML IJ SOLN
INTRAMUSCULAR | Status: AC
  Filled 2019-08-10: qty 2

## 2019-08-10 MED ORDER — HYDROMORPHONE HCL 1 MG/ML IJ SOLN
INTRAMUSCULAR | Status: AC
  Filled 2019-08-10: qty 1

## 2019-08-10 MED ORDER — TAMSULOSIN HCL 0.4 MG PO CAPS: 0.4000 mg | ORAL_CAPSULE | Freq: Every day | ORAL | 11 refills | Status: DC | PRN

## 2019-08-10 MED ORDER — CIPROFLOXACIN IN D5W 400 MG/200ML IV SOLN
INTRAVENOUS | Status: AC
  Filled 2019-08-10: qty 200

## 2019-08-10 MED ORDER — MEPERIDINE HCL 25 MG/ML IJ SOLN
6.2500 mg | INTRAMUSCULAR | Status: DC | PRN
  Filled 2019-08-10: qty 1

## 2019-08-10 MED ORDER — HYDROCODONE-ACETAMINOPHEN 7.5-325 MG PO TABS
1.0000 | ORAL_TABLET | Freq: Once | ORAL | Status: DC | PRN
  Filled 2019-08-10: qty 1

## 2019-08-10 MED ORDER — PHENYLEPHRINE 40 MCG/ML (10ML) SYRINGE FOR IV PUSH (FOR BLOOD PRESSURE SUPPORT)
PREFILLED_SYRINGE | INTRAVENOUS | Status: AC
  Filled 2019-08-10: qty 10

## 2019-08-10 MED ORDER — PHENYLEPHRINE HCL (PRESSORS) 10 MG/ML IV SOLN
INTRAVENOUS | Status: DC | PRN
  Administered 2019-08-10: 80 ug via INTRAVENOUS

## 2019-08-10 MED ORDER — ACETAMINOPHEN 10 MG/ML IV SOLN
1000.0000 mg | Freq: Once | INTRAVENOUS | Status: DC | PRN
  Filled 2019-08-10: qty 100

## 2019-08-10 MED ORDER — ACETAMINOPHEN 500 MG PO TABS
1000.0000 mg | ORAL_TABLET | Freq: Once | ORAL | Status: AC
  Administered 2019-08-10: 1000 mg via ORAL
  Filled 2019-08-10: qty 2

## 2019-08-10 MED ORDER — HYDROMORPHONE HCL 1 MG/ML IJ SOLN
0.2500 mg | INTRAMUSCULAR | Status: DC | PRN
  Administered 2019-08-10 (×2): 0.25 mg via INTRAVENOUS
  Filled 2019-08-10: qty 0.5

## 2019-08-10 MED ORDER — ONDANSETRON HCL 4 MG/2ML IJ SOLN
INTRAMUSCULAR | Status: DC | PRN
  Administered 2019-08-10: 4 mg via INTRAVENOUS

## 2019-08-10 MED ORDER — ACETAMINOPHEN 500 MG PO TABS
ORAL_TABLET | ORAL | Status: AC
  Filled 2019-08-10: qty 2

## 2019-08-10 MED ORDER — IOHEXOL 300 MG/ML  SOLN
INTRAMUSCULAR | Status: DC | PRN
  Administered 2019-08-10: 7 mL

## 2019-08-10 MED ORDER — LACTATED RINGERS IV SOLN
INTRAVENOUS | Status: DC
  Administered 2019-08-10: 09:00:00 50 mL/h via INTRAVENOUS
  Administered 2019-08-10: 06:00:00 via INTRAVENOUS
  Filled 2019-08-10: qty 1000

## 2019-08-10 MED ORDER — SODIUM CHLORIDE 0.9 % IV SOLN
INTRAVENOUS | Status: AC | PRN
  Administered 2019-08-10: 50 mL

## 2019-08-10 MED ORDER — PROMETHAZINE HCL 25 MG/ML IJ SOLN
6.2500 mg | INTRAMUSCULAR | Status: DC | PRN
  Filled 2019-08-10: qty 1

## 2019-08-10 MED ORDER — PROPOFOL 10 MG/ML IV BOLUS
INTRAVENOUS | Status: DC | PRN
  Administered 2019-08-10: 130 mg via INTRAVENOUS

## 2019-08-10 MED ORDER — FLEET ENEMA 7-19 GM/118ML RE ENEM
1.0000 | ENEMA | Freq: Once | RECTAL | Status: DC
  Filled 2019-08-10: qty 1

## 2019-08-10 MED ORDER — TRAMADOL HCL 50 MG PO TABS: 50.0000 mg | ORAL_TABLET | Freq: Four times a day (QID) | ORAL | 0 refills | Status: DC | PRN

## 2019-08-10 SURGICAL SUPPLY — 46 items
BAG URINE DRAINAGE (UROLOGICAL SUPPLIES) ×3 IMPLANT
BLADE CLIPPER SENSICLIP SURGIC (BLADE) ×3 IMPLANT
CATH FOLEY 2WAY SLVR  5CC 16FR (CATHETERS) ×2
CATH FOLEY 2WAY SLVR 5CC 16FR (CATHETERS) ×1 IMPLANT
CATH ROBINSON RED A/P 16FR (CATHETERS) IMPLANT
CATH ROBINSON RED A/P 20FR (CATHETERS) ×3 IMPLANT
CLOTH BEACON ORANGE TIMEOUT ST (SAFETY) ×3 IMPLANT
CONT SPECI 4OZ STER CLIK (MISCELLANEOUS) ×6 IMPLANT
COVER BACK TABLE 60X90IN (DRAPES) ×3 IMPLANT
COVER MAYO STAND STRL (DRAPES) ×3 IMPLANT
COVER WAND RF STERILE (DRAPES) ×3 IMPLANT
DRSG TEGADERM 4X4.75 (GAUZE/BANDAGES/DRESSINGS) ×3 IMPLANT
DRSG TEGADERM 8X12 (GAUZE/BANDAGES/DRESSINGS) ×3 IMPLANT
GAUZE SPONGE 4X4 12PLY STRL (GAUZE/BANDAGES/DRESSINGS) ×3 IMPLANT
GLOVE BIO SURGEON STRL SZ 6 (GLOVE) IMPLANT
GLOVE BIO SURGEON STRL SZ 6.5 (GLOVE) IMPLANT
GLOVE BIO SURGEON STRL SZ7 (GLOVE) ×3 IMPLANT
GLOVE BIO SURGEON STRL SZ7.5 (GLOVE) ×6 IMPLANT
GLOVE BIO SURGEON STRL SZ8 (GLOVE) ×3 IMPLANT
GLOVE BIO SURGEONS STRL SZ 6.5 (GLOVE)
GLOVE BIOGEL PI IND STRL 6 (GLOVE) IMPLANT
GLOVE BIOGEL PI IND STRL 6.5 (GLOVE) IMPLANT
GLOVE BIOGEL PI IND STRL 7.0 (GLOVE) IMPLANT
GLOVE BIOGEL PI IND STRL 7.5 (GLOVE) ×1 IMPLANT
GLOVE BIOGEL PI IND STRL 8 (GLOVE) IMPLANT
GLOVE BIOGEL PI INDICATOR 6 (GLOVE)
GLOVE BIOGEL PI INDICATOR 6.5 (GLOVE)
GLOVE BIOGEL PI INDICATOR 7.0 (GLOVE)
GLOVE BIOGEL PI INDICATOR 7.5 (GLOVE) ×2
GLOVE BIOGEL PI INDICATOR 8 (GLOVE)
GLOVE SURG ORTHO 8.5 STRL (GLOVE) ×3 IMPLANT
GOWN STRL REUS W/TWL LRG LVL3 (GOWN DISPOSABLE) ×3 IMPLANT
GOWN STRL REUS W/TWL XL LVL3 (GOWN DISPOSABLE) ×6 IMPLANT
HOLDER FOLEY CATH W/STRAP (MISCELLANEOUS) IMPLANT
I-Seed AgX100 ×144 IMPLANT
IMPL SPACEOAR SYSTEM 10ML (Spacer) ×1 IMPLANT
IMPLANT SPACEOAR SYSTEM 10ML (Spacer) ×3 IMPLANT
IV SOD CHL 0.9% 1000ML (IV SOLUTION) ×3 IMPLANT
KIT TURNOVER CYSTO (KITS) ×3 IMPLANT
MARKER SKIN DUAL TIP RULER LAB (MISCELLANEOUS) ×3 IMPLANT
PACK CYSTO (CUSTOM PROCEDURE TRAY) ×3 IMPLANT
SURGILUBE 2OZ TUBE FLIPTOP (MISCELLANEOUS) ×3 IMPLANT
SUT BONE WAX W31G (SUTURE) IMPLANT
SYR 10ML LL (SYRINGE) ×3 IMPLANT
UNDERPAD 30X30 (UNDERPADS AND DIAPERS) ×6 IMPLANT
WATER STERILE IRR 500ML POUR (IV SOLUTION) ×3 IMPLANT

## 2019-08-10 NOTE — Discharge Instructions (Signed)
Radioactive Seed Implant Home Care Instructions   Activity:    Rest for the remainder of the day.  Do not drive or operate equipment today.  You may resume normal  activities in a few days as instructed by your physician, without risk of harmful radiation exposure to those around you, provided you follow the time and distance precautions on the Radiation Oncology Instruction Sheet.   Meals: Drink plenty of lipuids and eat light foods, such as gelatin or soup this evening .  You may return to normal meal plan tomorrow.  Return To Work: You may return to work as instructed by Naval architect.  Special Instruction:   If any seeds are found, use tweezers to pick up seeds and place in a glass container of any kind and bring to your physician's office.  Call your physician if any of these symptoms occur:   Persistent or heavy bleeding  Urine stream diminishes or stops completely after catheter is removed  Fever equal to or greater than 101 degrees F  Cloudy urine with a strong foul odor  Severe pain  You may feel some burning pain and/or hesitancy when you urinate after the catheter is removed.  These symptoms may increase over the next few weeks, but should diminish within forur to six weeks.  Applying moist heat to the lower abdomen or a hot tub bath may help relieve the pain.  If the discomfort becomes severe, please call your physician for additional medications.   Post Anesthesia Home Care Instructions  Activity: Get plenty of rest for the remainder of the day. A responsible individual must stay with you for 24 hours following the procedure.  For the next 24 hours, DO NOT: -Drive a car -Paediatric nurse -Drink alcoholic beverages -Take any medication unless instructed by your physician -Make any legal decisions or sign important papers.  Meals: Start with liquid foods such as gelatin or soup. Progress to regular foods as tolerated. Avoid greasy, spicy, heavy foods. If nausea  and/or vomiting occur, drink only clear liquids until the nausea and/or vomiting subsides. Call your physician if vomiting continues.  Special Instructions/Symptoms: Your throat may feel dry or sore from the anesthesia or the breathing tube placed in your throat during surgery. If this causes discomfort, gargle with warm salt water. The discomfort should disappear within 24 hours.         1 - You may have urinary urgency (bladder spasms) and bloody urine on / off x few weeks. This is normal.  2 - Call MD or go to ER for fever >102, severe pain / nausea / vomiting not relieved by medications, or acute change in medical status

## 2019-08-10 NOTE — Anesthesia Postprocedure Evaluation (Signed)
Anesthesia Post Note  Patient: Tyler Estrada  Procedure(s) Performed: RADIOACTIVE SEED IMPLANT/BRACHYTHERAPY IMPLANT (N/A Prostate) SPACE OAR INSTILLATION (N/A Prostate)     Patient location during evaluation: PACU Anesthesia Type: General Level of consciousness: awake and alert Pain management: pain level controlled Vital Signs Assessment: post-procedure vital signs reviewed and stable Respiratory status: spontaneous breathing, nonlabored ventilation, respiratory function stable and patient connected to nasal cannula oxygen Cardiovascular status: blood pressure returned to baseline and stable Postop Assessment: no apparent nausea or vomiting Anesthetic complications: no    Last Vitals:  Vitals:   08/10/19 0930 08/10/19 0945  BP: 122/79 123/77  Pulse: 64 (!) 57  Resp: 14 12  Temp:    SpO2: 100% 100%    Last Pain:  Vitals:   08/10/19 0945  TempSrc:   PainSc: 2                  [place]

## 2019-08-10 NOTE — Op Note (Signed)
NAMEROXAS, CLYMER MEDICAL RECORD JG:[id number] ACCOUNT [id number] DATE OF BIRTH:1956/05/07 FACILITY: [facility] LOCATION: [facility] PHYSICIAN:Sophiya Morello, MD  OPERATIVE REPORT  DATE OF PROCEDURE:  08/10/2019  PREOPERATIVE DIAGNOSIS:  High risk adenocarcinoma of the prostate.  PROCEDURE: 1.  Brachytherapy seed implantation. 2.  SpaceOAR implantation. 3.  Cystoscopy.  ESTIMATED BLOOD LOSS:  Nil.  COMPLICATIONS:  None.  SPECIMENS:  None.  RADIATION PARAMETERS:  110 Gy delivered over 48 seeds on 20 needles.  FINDINGS: 1.  A 43 mL prostate similar to prior. 2.  Excellent placement of SpaceOAR gel matrix in prerectal space. 3.  No evidence of intraluminal radiation seed with post-stimulation cystoscopy.  INDICATIONS:  The patient is a very pleasant 63 year old man who recently moved to [place] from [place], who was found on workup of a persistently elevated PSA to have a very high risk adenocarcinoma of the prostate with a grade 5 disease in 12  out of 12 cores.  Staging imaging was unremarkable for a significant volume metastatic disease.  Options discussed for management and he wished to proceed with curative intent primary therapy with multimodal radiation and 2 years of androgen deprivation.   He has received initial androgen deprivation and he presents for brachytherapy and seed implantation as part of his radiation dosage along with a SpaceOAR implantation.  Informed consent was then placed in medical record.  DESCRIPTION OF PROCEDURE:  The patient was identified.  The procedure being brachytherapy seed implantation, SpaceOAR and cystoscopy was confirmed.  Procedure timeout was performed.  Intravenous antibiotics administered.  General anesthesia induced.  The  patient was placed into a medium lithotomy position and his perineum was prepped.  The transrectal ultrasound and grid apparatus were placed and radiation planning and initial ultrasound to be performed as per  radiation oncology notes as per their  planning and prescribed dose, compositional analysis revealed placement of 48 seeds across 20 catheters.  These were then placed as per prescribed dose, taking exquisite care to avoid inadvertent seed placement based on ultrasound.  After seed placement,  the grid apparatus was taken down.  The transrectal ultrasound probe was taken off of anterior traction to better develop the preperitoneal space and the included SpaceOAR finder needle was placed perineally with the tip in the mid gland area in  perirectal space and a test injection of 2 mL of saline was placed, which corroborated excellent position.  Next, the SpaceOAR gel matrix was injected as per manufacturer's guidelines over 10 seconds with excellent displacement of anterior rectal wall  posteriorly.  Ultrasound was removed.  Digital rectal exam revealed no evidence of obvious gel within the rectal lumen.  The penis was reprepped.  Surgeon changed gloves and flexible cystoscopy was performed.  Flexible cystourethroscopy using a 16-French flexible cystoscope revealed unremarkable anterior and posterior urethra.  Inspection of bladder revealed minimal trabeculation, single ureteral orifices, no evidence of intraluminal seeds placement.   Retroflexion showed no additional findings.  The bladder was purposely filled with approximately 100 mL of saline which was left in place.  Procedure terminated.  The patient tolerated the procedure well.  No immediate complications.  The patient was  taken to postanesthesia care in stable condition.  Plan for discharge home after void.  TN/NUANCE  D:08/10/2019 T:08/10/2019 JOB:[id number]

## 2019-08-10 NOTE — Anesthesia Procedure Notes (Signed)
Procedure Name: LMA Insertion Date/Time: 08/10/2019 7:29 AM Performed by: Bufford Spikes, CRNA Pre-anesthesia Checklist: Patient identified, Emergency Drugs available, Suction available and Patient being monitored Patient Re-evaluated:Patient Re-evaluated prior to induction Oxygen Delivery Method: Circle system utilized Preoxygenation: Pre-oxygenation with 100% oxygen Induction Type: IV induction Ventilation: Mask ventilation without difficulty LMA: LMA inserted LMA Size: 4.0 Number of attempts: 1 Airway Equipment and Method: Bite block Placement Confirmation: positive ETCO2 Tube secured with: Tape Dental Injury: Teeth and Oropharynx as per pre-operative assessment

## 2019-08-10 NOTE — Transfer of Care (Signed)
Immediate Anesthesia Transfer of Care Note  Patient: Tyler Estrada  Procedure(s) Performed: RADIOACTIVE SEED IMPLANT/BRACHYTHERAPY IMPLANT (N/A Prostate) SPACE OAR INSTILLATION (N/A Prostate)  Patient Location: PACU  Anesthesia Type:General  Level of Consciousness: awake, alert  and oriented  Airway & Oxygen Therapy: Patient Spontanous Breathing and Patient connected to nasal cannula oxygen  Post-op Assessment: Report given to RN and Post -op Vital signs reviewed and stable  Post vital signs: Reviewed and stable  Last Vitals:  Vitals Value Taken Time  BP 107/73 08/10/19 0833  Temp    Pulse 81 08/10/19 0837  Resp 9 08/10/19 0837  SpO2 100 % 08/10/19 0837  Vitals shown include unvalidated device data.  Last Pain:  Vitals:   08/10/19 0602  TempSrc: Oral  PainSc: 0-No pain      Patients Stated Pain Goal: 5 (09/08/79 2217)  Complications: No apparent anesthesia complications

## 2019-08-10 NOTE — Brief Op Note (Signed)
08/10/2019  8:20 AM  PATIENT:  Tyler Estrada  63 y.o. male  PRE-OPERATIVE DIAGNOSIS:  PROSTATE CANCER  POST-OPERATIVE DIAGNOSIS:  PROSTATE CANCER  PROCEDURE:  Procedure(s): RADIOACTIVE SEED IMPLANT/BRACHYTHERAPY IMPLANT (N/A) SPACE OAR INSTILLATION (N/A)  SURGEON:  Surgeon(s) and Role:    * Alexis Frock, MD - Primary    * Tyler Pita, MD  PHYSICIAN ASSISTANT:   ASSISTANTS: none   ANESTHESIA:   general  EBL:  10 mL   BLOOD ADMINISTERED:none  DRAINS: none   LOCAL MEDICATIONS USED:  NONE  SPECIMEN:  No Specimen  DISPOSITION OF SPECIMEN:  N/A  COUNTS:  YES  TOURNIQUET:  * No tourniquets in log *  DICTATION: .Other Dictation: Dictation Number [id number]  PLAN OF CARE: Discharge to home after PACU  PATIENT DISPOSITION:  PACU - hemodynamically stable.   Delay start of Pharmacological VTE agent (>24hrs) due to surgical blood loss or risk of bleeding: yes

## 2019-08-10 NOTE — H&P (Signed)
Tyler Estrada is an 63 y.o. male.    Chief Complaint: Pre-OP Prostate Brachytherapy with SPACE-OAR  HPI:   1 - High Risk Prostate Cancer - 12/12 cores up to 90% Grade 5 cancer 03/2019 on eval PSA 94.7 TRUS BX " 46 mL", very slight median lobe. DRE firm but mobile. CT and Bone Scan 03/2019 without locally advanced or distant disease. After consideration of primary therapy options he opts for 2 years androgen deprivation plus radiotherapy with XRT + brachy boost.    PMH sig for CAD/Stent (follows Dorris Carnes MD [place], not limiting), obesity, partial knee replacement, hip replacement. He is a Freight forwarder for [employer], originally from [place], his wife is retired Therapist, sports. His PCP is Silvestre Mesi MD.   Today " Tyler Estrada" is seen to proceed with prostate brachyherapy. He is current on LUpron. NO ionterval fevera. C19 negative .      Past Medical History:  Diagnosis Date  . Arthritis    knees, hips, hands  . Complication of anesthesia    Woke up during hip replacement  . Coronary artery disease    a. s/p DES to RCA in 2008;  b. 10/2015 Cath: LM nl, LAD 50ost/p, OM1/2 min irregs, RCA 35mISR, 360m ISR, EF 65%.  . Echocardiogram    Echo 11/16: EF 60-65, normal wall motion, grade 1 diastolic dysfunction  . Grade I diastolic dysfunction 1176/73/4193 ECHO  . Hyperlipidemia   . Iron deficiency anemia due to chronic blood loss 09/10/2015  . Melanoma ([facility]   Nose  . Pericarditis    a. 10/2015->colchicine.  . [place]  . Ulcerative [place] with [place] ([facility]2409  Past Surgical History:  Procedure Laterality Date  . CARDIAC CATHETERIZATION N/A 11/18/2015   Procedure: Left Heart Cath and Coronary Angiography;  Surgeon: MiSherren MochaMD;  [facility];  Service: Cardiovascular;  Laterality: N/A;  . COLONOSCOPY  2019  . CORONARY ANGIOPLASTY WITH STENT PLACEMENT  2008   a. Cordis stent to RCA in 2008 2.7525m 40m100m HIP ARTHROPLASTY  Right   . MOHS SURGERY    . PARTIAL KNEE ARTHROPLASTY Left 11/30/2018   Procedure: LEFT UNICOMPARTMENTAL KNEE Medially;  Surgeon: OlinParalee Cancel;  Location: [facility];  Service: Orthopedics;  Laterality: Left;  70 mins with block  . TONSILLECTOMY     childhood    Family History  Problem Relation Age of Onset  . Lymphoma Mother   . Hypertension Mother   . Arthritis Mother   . Cancer Mother   . CAD Father   . Heart disease Father   . Heart attack Father   . Arthritis Sister   . Heart disease Sister   . Hypertension Brother    Social History:  reports that he has never smoked. He has never used smokeless tobacco. He reports current alcohol use. He reports that he does not use drugs.  Allergies:  Allergies  Allergen Reactions  . Prednisone     Blurred vision     Medications Prior to Admission  Medication Sig Dispense Refill  . aspirin EC 81 MG tablet Take 81 mg by mouth daily.    . cetirizine (ZYRTEC) 10 MG tablet Take 10 mg by mouth daily.    . mesalamine (LIALDA) 1.2 g EC tablet Take by mouth daily with breakfast. Is taking one tablet 2 times per day    . metoprolol tartrate (LOPRESSOR) 25 MG tablet TAKE  0.5 TABLETS (12.5 MG TOTAL) BY MOUTH DAILY. 45 tablet 2  . Multiple Vitamins-Minerals (CENTRUM SILVER PO) Take 1 tablet by mouth daily.     . Omega-3 Fatty Acids (FISH OIL) 1200 MG CAPS Take 1 capsule by mouth at bedtime.     . simvastatin (ZOCOR) 40 MG tablet TAKE 1 TABLET BY MOUTH EVERY DAY 90 tablet 2  . MITIGARE 0.6 MG CAPS Take 0.6 mg by mouth daily. Changed to Brand name to see if insurance will cover [place] (Patient taking differently: Take 0.6 mg by mouth daily as needed (Chest pain). Changed to Brand name to see if insurance will cover Mitigare) 90 capsule 1    No results found for this or any previous visit (from the past 59 hour(s)). No results found.  Review of Systems  Constitutional: Negative for chills and fever.  All other systems reviewed and are  negative.   Blood pressure 129/75, pulse 67, temperature 97.8 F (36.6 C), temperature source Oral, resp. rate 16, height 5' 4"  (1.626 m), weight 82.8 kg, SpO2 99 %. Physical Exam  Constitutional: He appears well-developed.  HENT:  Head: Normocephalic.  Cardiovascular: Normal rate.  Respiratory: Effort normal.  GI: Soft.  Stable mild obesity.   Genitourinary:    Genitourinary Comments: No CVAT   Neurological: He is alert.  Skin: Skin is warm.  Psychiatric: He has a normal mood and affect.     Assessment/Plan  Proceed as planned with primary vrachyterapy with SPACE-OAR as part of multimodal therapy for aggressive disease. Risks, benefits, alternatives, expected peri-op course discussed previously and reiterated tdoday.   Alexis Frock, MD 08/10/2019, 7:19 AM

## 2019-08-12 NOTE — Progress Notes (Signed)
  Radiation Oncology         (336) [phone] ________________________________  Name: Tyler Estrada MRN: [id number]  Date: 08/12/2019  DOB: 07-Jan-1956       Prostate Seed Implant  PJ:[email or web address], Gay Filler, MD  No ref. provider found  DIAGNOSIS: 63 y.o. gentleman with Stage T1c adenocarcinoma of the prostate with Gleason score of 4+5, and PSA of 74.70.    ICD-10-CM   1. Preop testing  Z01.818 DG Chest 2 View    DG Chest 2 View    PROCEDURE: Insertion of radioactive I-125 seeds into the prostate gland.  RADIATION DOSE: 110 Gy, boost therapy.  TECHNIQUE: Tyler Estrada was brought to the operating room with the urologist. He was placed in the dorsolithotomy position. He was catheterized and a rectal tube was inserted. The perineum was shaved, prepped and draped. The ultrasound probe was then introduced into the rectum to see the prostate gland.  TREATMENT DEVICE: A needle grid was attached to the ultrasound probe stand and anchor needles were placed.  3D PLANNING: The prostate was imaged in 3D using a sagittal sweep of the prostate probe. These images were transferred to the planning computer. There, the prostate, urethra and rectum were defined on each axial reconstructed image. Then, the software created an optimized 3D plan and a few seed positions were adjusted. The quality of the plan was reviewed using [facility] information for the target and the following two organs at risk:  Urethra and Rectum.  Then the accepted plan was printed and handed off to the radiation therapist.  Under my supervision, the custom loading of the seeds and spacers was carried out and loaded into sealed vicryl sleeves.  These pre-loaded needles were then placed into the needle holder.Marland Kitchen  PROSTATE VOLUME STUDY:  Using transrectal ultrasound the volume of the prostate was verified to be 23.4 cc.  SPECIAL TREATMENT PROCEDURE/SUPERVISION AND HANDLING: The pre-loaded needles were then delivered under sagittal guidance. A total  of 20 needles were used to deposit 48 seeds in the prostate gland. The individual seed activity was 0.380 mCi.  SpaceOAR:  Yes  COMPLEX SIMULATION: At the end of the procedure, an anterior radiograph of the pelvis was obtained to document seed positioning and count. Cystoscopy was performed to check the urethra and bladder.  MICRODOSIMETRY: At the end of the procedure, the patient was emitting 0.084 mR/hr at 1 meter. Accordingly, he was considered safe for hospital discharge.  PLAN: The patient will return to the radiation oncology clinic for post implant CT dosimetry in three weeks.   ________________________________  Sheral Apley Tammi Klippel, M.D.

## 2019-08-13 ENCOUNTER — Encounter (HOSPITAL_BASED_OUTPATIENT_CLINIC_OR_DEPARTMENT_OTHER): Payer: Self-pay | Admitting: Urology

## 2019-08-23 ENCOUNTER — Telehealth: Payer: Self-pay | Admitting: *Deleted

## 2019-08-23 NOTE — Telephone Encounter (Signed)
Called patient to remind of post seed appts. for 08-30-19, spoke with patient and he is aware of these appts.

## 2019-08-30 ENCOUNTER — Other Ambulatory Visit: Payer: Self-pay

## 2019-08-30 ENCOUNTER — Ambulatory Visit: Payer: 59 | Admitting: Radiation Oncology

## 2019-08-30 ENCOUNTER — Ambulatory Visit
Admission: RE | Admit: 2019-08-30 | Discharge: 2019-08-30 | Disposition: A | Discharge status: Home / Self Care | Payer: 59 | Source: Ambulatory Visit | Attending: Radiation Oncology | Admitting: Radiation Oncology

## 2019-08-30 ENCOUNTER — Ambulatory Visit: Payer: 59 | Admitting: Urology

## 2019-08-30 DIAGNOSIS — C61 Malignant neoplasm of prostate: Secondary | ICD-10-CM | POA: Diagnosis present

## 2019-09-02 NOTE — Progress Notes (Signed)
  Radiation Oncology         (336) [phone] ________________________________  Name: Tyler Estrada MRN: [id number]  Date: 08/30/2019  DOB: 1956-01-11  SIMULATION AND TREATMENT PLANNING NOTE    ICD-10-CM   1. Prostate cancer [place])  C61     DIAGNOSIS:  63 y.o. gentleman with Stage T1c adenocarcinoma of the prostate with Gleason score of 4+5, and PSA of 74.70  NARRATIVE:  The patient was brought to the [facility].  Identity was confirmed.  All relevant records and images related to the planned course of therapy were reviewed.  The patient freely provided informed written consent to proceed with treatment after reviewing the details related to the planned course of therapy. The consent form was witnessed and verified by the simulation staff.  Then, the patient was set-up in a stable reproducible supine position for radiation therapy.  A vacuum lock pillow device was custom fabricated to position his legs in a reproducible immobilized position.  Then, I performed a urethrogram under sterile conditions to identify the prostatic apex.  CT images were obtained.  Surface markings were placed.  The CT images were loaded into the planning software.  Then the prostate target and avoidance structures including the rectum, bladder, bowel and hips were contoured.  Treatment planning then occurred.  The radiation prescription was entered and confirmed.  A total of one complex treatment devices were fabricated. I have requested : Intensity Modulated Radiotherapy (IMRT) is medically necessary for this case for the following reason:  Rectal sparing.Marland Kitchen  PLAN:  The patient will receive 45 Gy in 25 fractions of 1.8 Gy, to supplement an up-front prostate seed implant boost of 110 Gy to achieve a total nominal dose of 165 Gy.  ________________________________  Sheral Apley Tammi Klippel, M.D.

## 2019-09-05 ENCOUNTER — Ambulatory Visit (HOSPITAL_COMMUNITY)
Admission: RE | Admit: 2019-09-05 | Discharge: 2019-09-05 | Disposition: A | Discharge status: Home / Self Care | Payer: 59 | Source: Ambulatory Visit | Attending: Urology | Admitting: Urology

## 2019-09-05 ENCOUNTER — Other Ambulatory Visit: Payer: Self-pay

## 2019-09-05 DIAGNOSIS — C61 Malignant neoplasm of prostate: Secondary | ICD-10-CM | POA: Diagnosis not present

## 2019-09-10 ENCOUNTER — Ambulatory Visit
Admission: RE | Admit: 2019-09-10 | Discharge: 2019-09-10 | Disposition: A | Discharge status: Home / Self Care | Payer: 59 | Source: Ambulatory Visit | Attending: Radiation Oncology | Admitting: Radiation Oncology

## 2019-09-10 ENCOUNTER — Encounter: Payer: Self-pay | Admitting: Medical Oncology

## 2019-09-10 ENCOUNTER — Other Ambulatory Visit: Payer: Self-pay

## 2019-09-10 DIAGNOSIS — C61 Malignant neoplasm of prostate: Secondary | ICD-10-CM | POA: Diagnosis not present

## 2019-09-11 ENCOUNTER — Other Ambulatory Visit: Payer: Self-pay

## 2019-09-11 ENCOUNTER — Ambulatory Visit
Admission: RE | Admit: 2019-09-11 | Discharge: 2019-09-11 | Disposition: A | Discharge status: Home / Self Care | Payer: 59 | Source: Ambulatory Visit | Attending: Radiation Oncology | Admitting: Radiation Oncology

## 2019-09-11 DIAGNOSIS — C61 Malignant neoplasm of prostate: Secondary | ICD-10-CM | POA: Diagnosis not present

## 2019-09-12 ENCOUNTER — Ambulatory Visit
Admission: RE | Admit: 2019-09-12 | Discharge: 2019-09-12 | Disposition: A | Discharge status: Home / Self Care | Payer: 59 | Source: Ambulatory Visit | Attending: Radiation Oncology | Admitting: Radiation Oncology

## 2019-09-12 ENCOUNTER — Other Ambulatory Visit: Payer: Self-pay

## 2019-09-12 DIAGNOSIS — C61 Malignant neoplasm of prostate: Secondary | ICD-10-CM | POA: Diagnosis not present

## 2019-09-13 ENCOUNTER — Ambulatory Visit
Admission: RE | Admit: 2019-09-13 | Discharge: 2019-09-13 | Disposition: A | Discharge status: Home / Self Care | Payer: 59 | Source: Ambulatory Visit | Attending: Radiation Oncology | Admitting: Radiation Oncology

## 2019-09-13 ENCOUNTER — Other Ambulatory Visit: Payer: Self-pay

## 2019-09-13 DIAGNOSIS — C61 Malignant neoplasm of prostate: Secondary | ICD-10-CM | POA: Diagnosis not present

## 2019-09-14 ENCOUNTER — Other Ambulatory Visit: Payer: Self-pay

## 2019-09-14 ENCOUNTER — Ambulatory Visit
Admission: RE | Admit: 2019-09-14 | Discharge: 2019-09-14 | Disposition: A | Discharge status: Home / Self Care | Payer: 59 | Source: Ambulatory Visit | Attending: Radiation Oncology | Admitting: Radiation Oncology

## 2019-09-14 DIAGNOSIS — C61 Malignant neoplasm of prostate: Secondary | ICD-10-CM | POA: Diagnosis not present

## 2019-09-16 NOTE — Progress Notes (Signed)
  Radiation Oncology         (336) [phone] ________________________________  Name: Tyler Estrada MRN: [id number]  Date: 09/10/2019  DOB: 05-May-1956  COMPLEX SIMULATION NOTE  NARRATIVE:  The patient was brought to the [facility] today following prostate seed implantation approximately one month ago.  Identity was confirmed.  All relevant records and images related to the planned course of therapy were reviewed.  Then, the patient was set-up supine.  CT images were obtained.  The CT images were loaded into the planning software.  Then the prostate and rectum were contoured.  Treatment planning then occurred.  The implanted iodine 125 seeds were identified by the physics staff for projection of radiation distribution  I have requested : 3D Simulation  I have requested a DVH of the following structures: Prostate and rectum.    ________________________________  Sheral Apley Tammi Klippel, M.D.

## 2019-09-17 ENCOUNTER — Ambulatory Visit
Admission: RE | Admit: 2019-09-17 | Discharge: 2019-09-17 | Disposition: A | Discharge status: Home / Self Care | Payer: 59 | Source: Ambulatory Visit | Attending: Radiation Oncology | Admitting: Radiation Oncology

## 2019-09-17 ENCOUNTER — Other Ambulatory Visit: Payer: Self-pay

## 2019-09-17 DIAGNOSIS — C61 Malignant neoplasm of prostate: Secondary | ICD-10-CM | POA: Diagnosis not present

## 2019-09-18 ENCOUNTER — Other Ambulatory Visit: Payer: Self-pay

## 2019-09-18 ENCOUNTER — Ambulatory Visit
Admission: RE | Admit: 2019-09-18 | Discharge: 2019-09-18 | Disposition: A | Discharge status: Home / Self Care | Payer: 59 | Source: Ambulatory Visit | Attending: Radiation Oncology | Admitting: Radiation Oncology

## 2019-09-18 DIAGNOSIS — C61 Malignant neoplasm of prostate: Secondary | ICD-10-CM | POA: Diagnosis not present

## 2019-09-19 ENCOUNTER — Other Ambulatory Visit: Payer: Self-pay

## 2019-09-19 ENCOUNTER — Ambulatory Visit
Admission: RE | Admit: 2019-09-19 | Discharge: 2019-09-19 | Disposition: A | Discharge status: Home / Self Care | Payer: 59 | Source: Ambulatory Visit | Attending: Radiation Oncology | Admitting: Radiation Oncology

## 2019-09-19 DIAGNOSIS — C61 Malignant neoplasm of prostate: Secondary | ICD-10-CM | POA: Diagnosis not present

## 2019-09-20 ENCOUNTER — Other Ambulatory Visit: Payer: Self-pay

## 2019-09-20 ENCOUNTER — Ambulatory Visit
Admission: RE | Admit: 2019-09-20 | Discharge: 2019-09-20 | Disposition: A | Discharge status: Home / Self Care | Payer: 59 | Source: Ambulatory Visit | Attending: Radiation Oncology | Admitting: Radiation Oncology

## 2019-09-20 DIAGNOSIS — C61 Malignant neoplasm of prostate: Secondary | ICD-10-CM | POA: Insufficient documentation

## 2019-09-21 ENCOUNTER — Ambulatory Visit
Admission: RE | Admit: 2019-09-21 | Discharge: 2019-09-21 | Disposition: A | Discharge status: Home / Self Care | Payer: 59 | Source: Ambulatory Visit | Attending: Radiation Oncology | Admitting: Radiation Oncology

## 2019-09-21 ENCOUNTER — Other Ambulatory Visit: Payer: Self-pay

## 2019-09-21 DIAGNOSIS — C61 Malignant neoplasm of prostate: Secondary | ICD-10-CM | POA: Diagnosis not present

## 2019-09-24 ENCOUNTER — Other Ambulatory Visit: Payer: Self-pay | Admitting: Internal Medicine

## 2019-09-24 ENCOUNTER — Other Ambulatory Visit: Payer: Self-pay

## 2019-09-24 ENCOUNTER — Ambulatory Visit
Admission: RE | Admit: 2019-09-24 | Discharge: 2019-09-24 | Disposition: A | Discharge status: Home / Self Care | Payer: 59 | Source: Ambulatory Visit | Attending: Radiation Oncology | Admitting: Radiation Oncology

## 2019-09-24 DIAGNOSIS — C61 Malignant neoplasm of prostate: Secondary | ICD-10-CM | POA: Diagnosis not present

## 2019-09-25 ENCOUNTER — Other Ambulatory Visit: Payer: Self-pay

## 2019-09-25 ENCOUNTER — Ambulatory Visit
Admission: RE | Admit: 2019-09-25 | Discharge: 2019-09-25 | Disposition: A | Discharge status: Home / Self Care | Payer: 59 | Source: Ambulatory Visit | Attending: Radiation Oncology | Admitting: Radiation Oncology

## 2019-09-25 DIAGNOSIS — C61 Malignant neoplasm of prostate: Secondary | ICD-10-CM | POA: Diagnosis not present

## 2019-09-26 ENCOUNTER — Other Ambulatory Visit: Payer: Self-pay

## 2019-09-26 ENCOUNTER — Encounter: Payer: Self-pay | Admitting: Radiation Oncology

## 2019-09-26 ENCOUNTER — Ambulatory Visit
Admission: RE | Admit: 2019-09-26 | Discharge: 2019-09-26 | Disposition: A | Discharge status: Home / Self Care | Payer: 59 | Source: Ambulatory Visit | Attending: Radiation Oncology | Admitting: Radiation Oncology

## 2019-09-26 DIAGNOSIS — C61 Malignant neoplasm of prostate: Secondary | ICD-10-CM | POA: Diagnosis not present

## 2019-09-27 ENCOUNTER — Other Ambulatory Visit: Payer: Self-pay

## 2019-09-27 ENCOUNTER — Ambulatory Visit
Admission: RE | Admit: 2019-09-27 | Discharge: 2019-09-27 | Disposition: A | Discharge status: Home / Self Care | Payer: 59 | Source: Ambulatory Visit | Attending: Radiation Oncology | Admitting: Radiation Oncology

## 2019-09-27 DIAGNOSIS — C61 Malignant neoplasm of prostate: Secondary | ICD-10-CM | POA: Diagnosis not present

## 2019-09-28 ENCOUNTER — Other Ambulatory Visit: Payer: Self-pay

## 2019-09-28 ENCOUNTER — Ambulatory Visit
Admission: RE | Admit: 2019-09-28 | Discharge: 2019-09-28 | Disposition: A | Discharge status: Home / Self Care | Payer: 59 | Source: Ambulatory Visit | Attending: Radiation Oncology | Admitting: Radiation Oncology

## 2019-09-28 DIAGNOSIS — C61 Malignant neoplasm of prostate: Secondary | ICD-10-CM | POA: Diagnosis not present

## 2019-10-01 ENCOUNTER — Ambulatory Visit (HOSPITAL_BASED_OUTPATIENT_CLINIC_OR_DEPARTMENT_OTHER)
Admission: RE | Admit: 2019-10-01 | Discharge: 2019-10-01 | Disposition: A | Discharge status: Home / Self Care | Payer: 59 | Source: Ambulatory Visit | Attending: Family Medicine | Admitting: Family Medicine

## 2019-10-01 ENCOUNTER — Other Ambulatory Visit: Payer: Self-pay

## 2019-10-01 ENCOUNTER — Ambulatory Visit
Admission: RE | Admit: 2019-10-01 | Discharge: 2019-10-01 | Disposition: A | Discharge status: Home / Self Care | Payer: 59 | Source: Ambulatory Visit | Attending: Radiation Oncology | Admitting: Radiation Oncology

## 2019-10-01 ENCOUNTER — Ambulatory Visit (INDEPENDENT_AMBULATORY_CARE_PROVIDER_SITE_OTHER): Payer: 59 | Admitting: Family Medicine

## 2019-10-01 ENCOUNTER — Encounter: Payer: Self-pay | Admitting: Family Medicine

## 2019-10-01 VITALS — BP 134/82 | HR 97 | Temp 97.0°F | Resp 16 | Ht 64.0 in | Wt 185.0 lb

## 2019-10-01 DIAGNOSIS — M79642 Pain in left hand: Secondary | ICD-10-CM | POA: Diagnosis not present

## 2019-10-01 DIAGNOSIS — M79641 Pain in right hand: Secondary | ICD-10-CM

## 2019-10-01 DIAGNOSIS — C61 Malignant neoplasm of prostate: Secondary | ICD-10-CM | POA: Diagnosis not present

## 2019-10-01 NOTE — Patient Instructions (Signed)
It was good to see you today!  I put lab orders in the computer and on paper for you Please go to the ground floor imaging dept for hand x-rays today You do have a trigger finger of your left thumb- this can be treated with a steroid injection by ortho if you like at some point

## 2019-10-01 NOTE — Progress Notes (Addendum)
[facility] [place], [place], [place] [address] [phone]  Date:  10/01/2019   Name:  Tyler Estrada   DOB:  1956/04/05   MRN:  [id number]  PCP:  Darreld Mclean, MD    Chief Complaint: Hand Pain (bilateral finger pain, slight swelling especially in morning, thumb and index finger, no known injury, bump on thumb)   History of Present Illness:  Tyler Estrada is a 63 y.o. very pleasant male patient who presents with the following:  Patient with history of prostate cancer, ulcerative colitis, and CAD status post stenting in 2008 with cardiac cath in 2016, hyperlipidemia, hypertension  I have so far seen this patient once, in March of this year His cardiologist is Dr. Harrington Challenger Urologist is Dr. Tresa Moore GI doc is Magod  Unfortunately his prostate biopsy results in April of this year were positive for grade 5 cancer, he is currently undergoing radiation He has 11 more sessions to go No chemo needed He is feeing tired - the treatments are getting to be more bothersome for him, he has more pain with urination.  He is using Azo which does help some  Most recent labs in August, looked okay  Flu shot- this is done already  He has noted pain in his bilateral thumbs and index fingers He cannot open a bottle or do other tasks with his hands It has gone on for 5 days or so-no known injury or other inciting cause Never had this in the past It is actually gotten a bit better over the last couple of days No other acute joint pains No definite history of gout No fever He has also noted a trigger finger in the right thumb History of left knee replacement and right hip replacement -he does have orthopedic care established Patient Active Problem List   Diagnosis Date Noted  . Prostate cancer ([place]) 04/12/2019  . Obese 12/01/2018  . S/P left UKR 11/30/2018  . Pericarditis   . Elevated troponin 11/19/2015  . Acute  pericarditis 11/19/2015  . Hyperlipidemia   . CAD S/P percutaneous coronary angioplasty 11/18/2015  . Acute chest pain 11/18/2015  . Chest pain 11/18/2015  . Iron deficiency anemia due to chronic blood loss 09/10/2015  . Ulcerative colitis with complication ([place]) 26/37/8588    Past Medical History:  Diagnosis Date  . Arthritis    knees, hips, hands  . Complication of anesthesia    Woke up during hip replacement  . Coronary artery disease    a. s/p DES to RCA in 2008;  b. 10/2015 Cath: LM nl, LAD 50ost/p, OM1/2 min irregs, RCA 29mISR, 333m ISR, EF 65%.  . Echocardiogram    Echo 11/16: EF 60-65, normal wall motion, grade 1 diastolic dysfunction  . Grade I diastolic dysfunction 1150/27/7412 ECHO  . Hyperlipidemia   . Iron deficiency anemia due to chronic blood loss 09/10/2015  . Melanoma ([facility]   Nose  . Pericarditis    a. 10/2015->colchicine.  . [place]state cancer ([facility]  . Ulcerative colitis with complication ([facility]9/8/78/6767  Past Surgical History:  Procedure Laterality Date  . CARDIAC CATHETERIZATION N/A 11/18/2015   Procedure: Left Heart Cath and Coronary Angiography;  Surgeon: MiSherren MochaMD;  [facility];  Service: Cardiovascular;  Laterality: N/A;  . COLONOSCOPY  2019  . CORONARY ANGIOPLASTY WITH STENT PLACEMENT  2008   a. Cordis stent to RCA  in 2008 2.46m x 157m . HIP ARTHROPLASTY Right   . MOHS SURGERY    . PARTIAL KNEE ARTHROPLASTY Left 11/30/2018   Procedure: LEFT UNICOMPARTMENTAL KNEE Medially;  Surgeon: OlParalee CancelMD;  [facility]: [facility];  Service: Orthopedics;  Laterality: Left;  70 mins with block  . RADIOACTIVE SEED IMPLANT N/A 08/10/2019   Procedure: RADIOACTIVE SEED IMPLANT/BRACHYTHERAPY IMPLANT;  Surgeon: MaAlexis FrockMD;  [facility];  Laterality: N/A;  . SPACE OAR INSTILLATION N/A 08/10/2019   Procedure: SPACE OAR INSTILLATION;  Surgeon: MaAlexis FrockMD;  [facility];  Laterality: N/A;  . TONSILLECTOMY     childhood    Social History   Tobacco Use  . Smoking status: Never Smoker  . Smokeless tobacco: Never Used  Substance Use Topics  . Alcohol use: Yes    Alcohol/week: 0.0 standard drinks    Comment: 1-2 drinks per week.  . Drug use: No    Family History  Problem Relation Age of Onset  . Lymphoma Mother   . Hypertension Mother   . Arthritis Mother   . Cancer Mother   . CAD Father   . Heart disease Father   . Heart attack Father   . Arthritis Sister   . Heart disease Sister   . Hypertension Brother     Allergies  Allergen Reactions  . Prednisone     Blurred vision     Medication list has been reviewed and updated.  Current Outpatient Medications on File Prior to Visit  Medication Sig Dispense Refill  . aspirin EC 81 MG tablet Take 81 mg by mouth daily.    . cetirizine (ZYRTEC) 10 MG tablet Take 10 mg by mouth daily.    . mesalamine (LIALDA) 1.2 g EC tablet Take by mouth daily with breakfast. Is taking one tablet 2 times per day    . metoprolol tartrate (LOPRESSOR) 25 MG tablet TAKE 1/2 TABLET BY MOUTH EVERY DAY 45 tablet 0  . MITIGARE 0.6 MG CAPS Take 0.6 mg by mouth daily. Changed to Brand name to see if insurance will cover MiStrykerPatient taking differently: Take 0.6 mg by mouth daily as needed (Chest pain). Changed to Brand name to see if insurance will cover Mitigare) 90 capsule 1  . Multiple Vitamins-Minerals (CENTRUM SILVER PO) Take 1 tablet by mouth daily.     . Omega-3 Fatty Acids (FISH OIL) 1200 MG CAPS Take 1 capsule by mouth at bedtime.     . senna-docusate (SENOKOT-S) 8.6-50 MG tablet Take 1 tablet by mouth 2 (two) times daily. While taking strong pain meds to prevent constipation. 20 tablet 0  . simvastatin (ZOCOR) 40 MG tablet TAKE 1 TABLET BY MOUTH EVERY DAY 90 tablet 0  . tamsulosin (FLOMAX) 0.4 MG CAPS capsule Take 1 capsule (0.4 mg total) by mouth daily as needed. For urinary  urgency after prostate radiation. 30 capsule 11  . traMADol (ULTRAM) 50 MG tablet Take 1-2 tablets (50-100 mg total) by mouth every 6 (six) hours as needed for moderate pain or severe pain. Post-operatively 20 tablet 0   No current facility-administered medications on file prior to visit.     Review of Systems:  As per HPI- otherwise negative.  No fever or chills Physical Examination: Vitals:   10/01/19 1627  BP: 134/82  Pulse: 97  Resp: 16  Temp: (!) 97 F (36.1 C)  SpO2: 97%   Vitals:   10/01/19 1627  Weight: 185 lb (83.9 kg)  Height: 5' 4"  (1.626 m)   Body mass index is 31.76 kg/m. Ideal Body Weight: Weight in (lb) to have BMI = 25: 145.3  GEN: WDWN, NAD, Non-toxic, A & O x 3, overweight, looks well HEENT: Atraumatic, Normocephalic. Neck supple. No masses, No LAD. Ears and Nose: No external deformity. CV: RRR, No M/G/R. No JVD. No thrill. No extra heart sounds. PULM: CTA B, no wheezes, crackles, rhonchi. No retractions. No resp. distress. No accessory muscle use. EXTR: No c/c/e NEURO Normal gait.  PSYCH: Normally interactive. Conversant. Not depressed or anxious appearing.  Calm demeanor.  He has tenderness with range of motion at the first and second MCP joints, right and left.  Worse on the right.  The wrists are normal, IP joints also seem normal There is perhaps minimal swelling of the right thumb and index finger, but nothing definite.  No heat, redness, or skin lesions noted He does have an extensor trigger finger at the right thumb IP joint   Assessment and Plan: Pain in both hands - Plan: Sedimentation rate, Rheumatoid Factor, C-reactive protein, Uric acid, DG Hand Complete Right, DG Hand Complete Left  Here today with unexplained pain at the first and second MCP joints bilaterally He is having treatment done tomorrow at the cancer center, I have ordered labs for him to have drawn there if possible Request bilateral hand films today Suggested that he try a  thumb spica splint for the right hand, which is the worse of the 2 He plans to try ice, he does not want any prescription medication He cannot take NSAIDs very easily because of his ulcerative colitis  Signed Lamar Blinks, MD  Received his x-rays 10/13, message to patient  Dg Hand Complete Left  Result Date: 10/02/2019 CLINICAL DATA:  Bilateral hand pain for 1 week.  Prostate cancer. EXAM: LEFT HAND - COMPLETE 3+ VIEW COMPARISON:  None. FINDINGS: Mild degenerative changes are present in the DIP joints, similar the right hand. No acute or healing fracture is present. No sclerotic lesions are present. Bone mineralization is normal. IMPRESSION: 1. Mild degenerative changes. 2. No acute abnormality. 3. No evidence for metastatic disease. Electronically Signed   By: San Morelle M.D.   On: 10/02/2019 11:38   Dg Hand Complete Right  Result Date: 10/02/2019 CLINICAL DATA:  Bilateral hand pain for 1 week. Prostate cancer. Palpable lesion adjacent to the first MCP joint. EXAM: RIGHT HAND - COMPLETE 3+ VIEW COMPARISON:  Left hand radiographs of the same day. Whole-body bone scan 04/10/2019 FINDINGS: Degenerative changes are noted at the first MCP joint. There is some degenerative changes in the DIP joints is well. No acute or focal abnormality is present. No sclerotic lesions are evident. IMPRESSION: 1. Degenerative changes of the right hand including the first MCP joint. 2. No acute abnormality. 3. No evidence for metastatic disease to the hand. Electronically Signed   By: San Morelle M.D.   On: 10/02/2019 11:37

## 2019-10-02 ENCOUNTER — Ambulatory Visit
Admission: RE | Admit: 2019-10-02 | Discharge: 2019-10-02 | Disposition: A | Discharge status: Home / Self Care | Payer: 59 | Source: Ambulatory Visit | Attending: Radiation Oncology | Admitting: Radiation Oncology

## 2019-10-02 ENCOUNTER — Encounter: Payer: Self-pay | Admitting: Family Medicine

## 2019-10-02 ENCOUNTER — Other Ambulatory Visit: Payer: Self-pay

## 2019-10-02 ENCOUNTER — Other Ambulatory Visit (INDEPENDENT_AMBULATORY_CARE_PROVIDER_SITE_OTHER): Payer: 59

## 2019-10-02 DIAGNOSIS — M79642 Pain in left hand: Secondary | ICD-10-CM

## 2019-10-02 DIAGNOSIS — M79641 Pain in right hand: Secondary | ICD-10-CM

## 2019-10-02 DIAGNOSIS — C61 Malignant neoplasm of prostate: Secondary | ICD-10-CM | POA: Diagnosis not present

## 2019-10-02 LAB — C-REACTIVE PROTEIN: CRP: <1 mg/dL (ref 0.5–20.0)

## 2019-10-02 LAB — SEDIMENTATION RATE: Sed Rate: 14 mm/hr (ref 0–20)

## 2019-10-02 LAB — URIC ACID: Uric Acid, Serum: 5.1 mg/dL (ref 4.0–7.8)

## 2019-10-02 NOTE — Addendum Note (Signed)
Addended by: Kelle Darting A on: 10/02/2019 08:53 AM   Modules accepted: Orders

## 2019-10-03 ENCOUNTER — Other Ambulatory Visit: Payer: Self-pay

## 2019-10-03 ENCOUNTER — Ambulatory Visit: Admission: RE | Admit: 2019-10-03 | Payer: 59 | Source: Ambulatory Visit

## 2019-10-03 ENCOUNTER — Ambulatory Visit
Admission: RE | Admit: 2019-10-03 | Discharge: 2019-10-03 | Disposition: A | Discharge status: Home / Self Care | Payer: 59 | Source: Ambulatory Visit | Attending: Radiation Oncology | Admitting: Radiation Oncology

## 2019-10-03 DIAGNOSIS — C61 Malignant neoplasm of prostate: Secondary | ICD-10-CM | POA: Diagnosis not present

## 2019-10-03 LAB — RHEUMATOID FACTOR: Rheumatoid fact SerPl-aCnc: <14 IU/mL (ref <14)

## 2019-10-04 ENCOUNTER — Ambulatory Visit
Admission: RE | Admit: 2019-10-04 | Discharge: 2019-10-04 | Disposition: A | Discharge status: Home / Self Care | Payer: 59 | Source: Ambulatory Visit | Attending: Radiation Oncology | Admitting: Radiation Oncology

## 2019-10-04 ENCOUNTER — Other Ambulatory Visit: Payer: Self-pay

## 2019-10-04 DIAGNOSIS — C61 Malignant neoplasm of prostate: Secondary | ICD-10-CM | POA: Diagnosis not present

## 2019-10-05 ENCOUNTER — Ambulatory Visit
Admission: RE | Admit: 2019-10-05 | Discharge: 2019-10-05 | Disposition: A | Discharge status: Home / Self Care | Payer: 59 | Source: Ambulatory Visit | Attending: Radiation Oncology | Admitting: Radiation Oncology

## 2019-10-05 ENCOUNTER — Other Ambulatory Visit: Payer: Self-pay

## 2019-10-05 DIAGNOSIS — C61 Malignant neoplasm of prostate: Secondary | ICD-10-CM | POA: Diagnosis not present

## 2019-10-08 ENCOUNTER — Other Ambulatory Visit: Payer: Self-pay

## 2019-10-08 ENCOUNTER — Ambulatory Visit
Admission: RE | Admit: 2019-10-08 | Discharge: 2019-10-08 | Disposition: A | Discharge status: Home / Self Care | Payer: 59 | Source: Ambulatory Visit | Attending: Radiation Oncology | Admitting: Radiation Oncology

## 2019-10-08 DIAGNOSIS — C61 Malignant neoplasm of prostate: Secondary | ICD-10-CM | POA: Diagnosis not present

## 2019-10-09 ENCOUNTER — Other Ambulatory Visit: Payer: Self-pay

## 2019-10-09 ENCOUNTER — Ambulatory Visit
Admission: RE | Admit: 2019-10-09 | Discharge: 2019-10-09 | Disposition: A | Discharge status: Home / Self Care | Payer: 59 | Source: Ambulatory Visit | Attending: Radiation Oncology | Admitting: Radiation Oncology

## 2019-10-09 DIAGNOSIS — C61 Malignant neoplasm of prostate: Secondary | ICD-10-CM | POA: Diagnosis not present

## 2019-10-10 ENCOUNTER — Other Ambulatory Visit: Payer: Self-pay

## 2019-10-10 ENCOUNTER — Ambulatory Visit
Admission: RE | Admit: 2019-10-10 | Discharge: 2019-10-10 | Disposition: A | Discharge status: Home / Self Care | Payer: 59 | Source: Ambulatory Visit | Attending: Radiation Oncology | Admitting: Radiation Oncology

## 2019-10-10 DIAGNOSIS — C61 Malignant neoplasm of prostate: Secondary | ICD-10-CM | POA: Diagnosis not present

## 2019-10-11 ENCOUNTER — Ambulatory Visit
Admission: RE | Admit: 2019-10-11 | Discharge: 2019-10-11 | Disposition: A | Discharge status: Home / Self Care | Payer: 59 | Source: Ambulatory Visit | Attending: Radiation Oncology | Admitting: Radiation Oncology

## 2019-10-11 ENCOUNTER — Other Ambulatory Visit: Payer: Self-pay

## 2019-10-11 DIAGNOSIS — C61 Malignant neoplasm of prostate: Secondary | ICD-10-CM | POA: Diagnosis not present

## 2019-10-12 ENCOUNTER — Encounter: Payer: Self-pay | Admitting: Radiation Oncology

## 2019-10-12 ENCOUNTER — Encounter: Payer: Self-pay | Admitting: Medical Oncology

## 2019-10-12 ENCOUNTER — Ambulatory Visit
Admission: RE | Admit: 2019-10-12 | Discharge: 2019-10-12 | Disposition: A | Discharge status: Home / Self Care | Payer: 59 | Source: Ambulatory Visit | Attending: Radiation Oncology | Admitting: Radiation Oncology

## 2019-10-12 ENCOUNTER — Other Ambulatory Visit: Payer: Self-pay

## 2019-10-12 ENCOUNTER — Ambulatory Visit: Payer: 59

## 2019-10-12 DIAGNOSIS — C61 Malignant neoplasm of prostate: Secondary | ICD-10-CM | POA: Diagnosis not present

## 2019-10-15 ENCOUNTER — Ambulatory Visit: Payer: 59

## 2019-11-03 NOTE — Progress Notes (Signed)
  Radiation Oncology         [phone] ________________________________  Name: Jacksen Isip MRN: [id number]  Date: 09/26/2019  DOB: 1956-04-13  3D Planning Note   Prostate Brachytherapy Post-Implant Dosimetry  Diagnosis: 63 y.o. gentleman with Stage T1c adenocarcinoma of the prostate with Gleason score of 4+5, and PSA of 74.70  Narrative: On a previous date, Salahuddin Arismendez returned following prostate seed implantation for post implant planning. He underwent CT scan complex simulation to delineate the three-dimensional structures of the pelvis and demonstrate the radiation distribution.  Since that time, the seed localization, and complex isodose planning with dose volume histograms have now been completed.  Results:   Prostate Coverage - The dose of radiation delivered to the 90% or more of the prostate gland (D90) was 106.69% of the prescription dose. This exceeds our goal of greater than 90%. Rectal Sparing - The volume of rectal tissue receiving the prescription dose or higher was 0.0 cc. This falls under our thresholds tolerance of 1.0 cc.  Impression: The prostate seed implant appears to show adequate target coverage and appropriate rectal sparing.  Plan:  The patient will continue to follow with urology for ongoing PSA determinations. I would anticipate a high likelihood for local tumor control with minimal risk for rectal morbidity.  ________________________________  Sheral Apley Tammi Klippel, M.D.

## 2019-11-11 NOTE — Progress Notes (Signed)
Cardiology Office Note   Date:  11/12/2019   ID:  Tyler Estrada, DOB Feb 29, 1956, MRN [id number]  PCP:  Darreld Mclean, MD  Cardiologist:   Dorris Carnes, MD   F/U of CAD     History of Present Illness: Tyler Estrada is a 63 y.o. male with a history ofcoronary artery disease status post prior stenting to the RCA in 2008, prior pericarditis treated with colchicine, hypertension, hyperlipidemia, ulcerative colitis.  Cardiac catheterization in November 2016 demonstrated patent stents in the RCA and moderate nonobstructive disease in the LAD.  Ejection fraction by echocardiogram in 2016 was normal.    I saw the pt in Jan 2020  Since seen he has done OK  From a cardiac standpoint   He denies CP  Breathing is OK  No dizziness  No palpitations  When I saw him last labs showed and elevated PSA    He has since undergone RX for prostate CA            Current Meds  Medication Sig  . aspirin EC 81 MG tablet Take 81 mg by mouth daily.  . cetirizine (ZYRTEC) 10 MG tablet Take 10 mg by mouth daily.  . clobetasol cream (TEMOVATE) 0.05 % APPLY TO AFFECTED AREA TWICE A DAY  . Colchicine (MITIGARE) 0.6 MG CAPS Take by mouth as needed.  . mesalamine (LIALDA) 1.2 g EC tablet Take by mouth daily with breakfast. Is taking one tablet 2 times per day  . metoprolol tartrate (LOPRESSOR) 25 MG tablet TAKE 1/2 TABLET BY MOUTH EVERY DAY  . Multiple Vitamins-Minerals (CENTRUM SILVER PO) Take 1 tablet by mouth daily.   . nitroGLYCERIN (NITROSTAT) 0.4 MG SL tablet Place 1 tablet (0.4 mg total) under the tongue every 5 (five) minutes as needed for chest pain.  . Omega-3 Fatty Acids (FISH OIL) 1200 MG CAPS Take 1 capsule by mouth at bedtime.   Marland Kitchen Phenazopyridine HCl (AZO TABS PO) Take by mouth as needed.  . senna-docusate (SENOKOT-S) 8.6-50 MG tablet Take 1 tablet by mouth 2 (two) times daily. While taking strong pain meds to prevent constipation.  . simvastatin (ZOCOR) 40 MG tablet TAKE 1 TABLET BY MOUTH  EVERY DAY  . tamsulosin (FLOMAX) 0.4 MG CAPS capsule Take 1 capsule (0.4 mg total) by mouth daily as needed. For urinary urgency after prostate radiation.  . traMADol (ULTRAM) 50 MG tablet Take 1-2 tablets (50-100 mg total) by mouth every 6 (six) hours as needed for moderate pain or severe pain. Post-operatively  . [DISCONTINUED] nitroGLYCERIN (NITROSTAT) 0.4 MG SL tablet Place 0.4 mg under the tongue every 5 (five) minutes as needed for chest pain.     Allergies:   Prednisone   Past Medical History:  Diagnosis Date  . Arthritis    knees, hips, hands  . Complication of anesthesia    Woke up during hip replacement  . Coronary artery disease    a. s/p DES to RCA in 2008;  b. 10/2015 Cath: LM nl, LAD 50ost/p, OM1/2 min irregs, RCA 88mISR, 363m ISR, EF 65%.  . Echocardiogram    Echo 11/16: EF 60-65, normal wall motion, grade 1 diastolic dysfunction  . Grade I diastolic dysfunction 1167/89/3810 ECHO  . Hyperlipidemia   . Iron deficiency anemia due to chronic blood loss 09/10/2015  . [facility]anoma ([facility]   Nose  . Pericarditis    a. 10/2015->colchicine.  . Prostate cancer ([facility]  . Ulcerative colitis with complication ([facility]9/1/75/1025  Past Surgical History:  Procedure Laterality Date  . CARDIAC CATHETERIZATION N/A 11/18/2015   Procedure: Left Heart Cath and Coronary Angiography;  Surgeon: Sherren Mocha, MD;  Location: [place] [facility];  Service: Cardiovascular;  Laterality: N/A;  . COLONOSCOPY  2019  . CORONARY ANGIOPLASTY WITH STENT PLACEMENT  2008   a. Cordis stent to RCA in 2008 2.54m x 159m . HIP ARTHROPLASTY Right   . MOHS SURGERY    . PARTIAL KNEE ARTHROPLASTY Left 11/30/2018   Procedure: LEFT UNICOMPARTMENTAL KNEE Medially;  Surgeon: OlParalee CancelMD;  [facility];  Service: Orthopedics;  Laterality: Left;  70 mins with block  . RADIOACTIVE SEED IMPLANT N/A 08/10/2019   Procedure: RADIOACTIVE SEED IMPLANT/BRACHYTHERAPY IMPLANT;  Surgeon: MaAlexis FrockMD;   [facility];  Laterality: N/A;  . SPACE OAR INSTILLATION N/A 08/10/2019   Procedure: SPACE OAR INSTILLATION;  Surgeon: MaAlexis FrockMD;  [facility];  Laterality: N/A;  . TONSILLECTOMY     childhood     Social History:  The patient  reports that he has never smoked. He has never used smokeless tobacco. He reports current alcohol use. He reports that he does not use drugs.   Family History:  The patient's family history includes Arthritis in his mother and sister; CAD in his father; Cancer in his mother; Heart attack in his father; Heart disease in his father and sister; Hypertension in his brother and mother; Lymphoma in his mother.    ROS:  Please see the history of present illness. All other systems are reviewed and  Negative to the above problem except as noted.    PHYSICAL EXAM: VS:  BP 124/76   Pulse 84   Ht 5' 4"  (1.626 m)   Wt 192 lb (87.1 kg)   SpO2 96%   BMI 32.96 kg/m   GEN: Obese 6277o  in no acute distress  HEENT: normal  Neck: JVP is not elevated   Neck is full  No , carotid bruits Cardiac: RRR; no murmurs, rubs, or gallops,no edema  Respiratory:  clear to auscultation bilaterally, normal work of breathing GI: soft, nontender, nondistended, + BS  No hepatomegaly  MS: no deformity Moving all extremities   Skin: warm and dry, no rash Neuro:  Strength and sensation are intact Psych: euthymic mood, full affect   EKG:  EKG is not ordered today     Lipid Panel    Component Value Date/Time   CHOL 133 01/15/2019 0948   TRIG 102 01/15/2019 0948   HDL 46 01/15/2019 0948   CHOLHDL 2.9 01/15/2019 0948   CHOLHDL 2.8 10/07/2016 0857   VLDL 14 10/07/2016 0857   LDLCALC 67 01/15/2019 0948      Wt Readings from Last 3 Encounters:  11/12/19 192 lb (87.1 kg)  10/01/19 185 lb (83.9 kg)  08/10/19 182 lb 7 oz (82.8 kg)      ASSESSMENT AND PLAN:  1  CAD   Pt doing well  No  angina   Follow    2  HTN  BP is well controlled    3  HL  Will check lipdis today  Had been good    4 Prostate CA   Fininshing Rx  F/U in 10 months    Current medicines are reviewed at length with the patient today.  The patient does not have concerns regarding medicines.  Signed, PaDorris CarnesMD  11/12/2019 3:18 PM  [facility] [address], [place], [place]  [address] Phone: [phone]; Fax: [phone]

## 2019-11-12 ENCOUNTER — Encounter: Payer: Self-pay | Admitting: Internal Medicine

## 2019-11-12 ENCOUNTER — Other Ambulatory Visit: Payer: Self-pay

## 2019-11-12 ENCOUNTER — Ambulatory Visit (INDEPENDENT_AMBULATORY_CARE_PROVIDER_SITE_OTHER): Payer: 59 | Admitting: Internal Medicine

## 2019-11-12 VITALS — BP 124/76 | HR 84 | Ht 64.0 in | Wt 192.0 lb

## 2019-11-12 DIAGNOSIS — I2510 Atherosclerotic heart disease of native coronary artery without angina pectoris: Secondary | ICD-10-CM | POA: Diagnosis not present

## 2019-11-12 MED ORDER — NITROGLYCERIN 0.4 MG SL SUBL: 0.4000 mg | SUBLINGUAL_TABLET | SUBLINGUAL | 3 refills | Status: DC | PRN

## 2019-11-12 NOTE — Patient Instructions (Signed)
Medication Instructions:  No changes *If you need a refill on your cardiac medications before your next appointment, please call your pharmacy*  Lab Work: Lipids, tsh If you have labs (blood work) drawn today and your tests are completely normal, you will receive your results only by: Marland Kitchen MyChart Message (if you have MyChart) OR . A paper copy in the mail If you have any lab test that is abnormal or we need to change your treatment, we will call you to review the results.  Testing/Procedures: none  Follow-Up: At [facility], you and your health needs are our priority.  As part of our continuing mission to provide you with exceptional heart care, we have created designated Provider Care Teams.  These Care Teams include your primary Cardiologist (physician) and Advanced Practice Providers (APPs -  Physician Assistants and Nurse Practitioners) who all work together to provide you with the care you need, when you need it.  Your next appointment:   10 month(s)  The format for your next appointment:   In Person  Provider:   Dorris Carnes, MD  Other Instructions

## 2019-11-13 LAB — LIPID PANEL
Chol/HDL Ratio: 3 ratio (ref 0.0–5.0)
Cholesterol, Total: 158 mg/dL (ref 100–199)
HDL: 52 mg/dL (ref >39)
LDL Chol Calc (NIH): 80 mg/dL (ref 0–99)
Triglycerides: 148 mg/dL (ref 0–149)
VLDL Cholesterol Cal: 26 mg/dL (ref 5–40)

## 2019-11-13 LAB — TSH: TSH: 1.48 u[IU]/mL (ref 0.450–4.500)

## 2019-11-21 ENCOUNTER — Telehealth: Payer: Self-pay | Admitting: *Deleted

## 2019-11-21 DIAGNOSIS — I2510 Atherosclerotic heart disease of native coronary artery without angina pectoris: Secondary | ICD-10-CM

## 2019-11-21 DIAGNOSIS — E785 Hyperlipidemia, unspecified: Secondary | ICD-10-CM

## 2019-11-21 MED ORDER — ROSUVASTATIN CALCIUM 20 MG PO TABS: 20.0000 mg | ORAL_TABLET | Freq: Every day | ORAL | 3 refills | Status: DC

## 2019-11-21 NOTE — Telephone Encounter (Signed)
-----   Message from Dorris Carnes V, MD sent at 11/13/2019  4:20 PM EST ----- LDL is higher than it should be   Needs to be 70 or below WOuld recomm switching to Crestor 20 mg   F?U lipids in 8 wks with AST Stop simvistatin  THyroid function is  Normal

## 2019-11-21 NOTE — Telephone Encounter (Signed)
Patient informed of results and recommendations from Dr. Harrington Challenger. Will stop simvastatin and start Crestor 20 mg daily. Lab appointment for lipids/ast for 01/18/20.

## 2019-11-26 ENCOUNTER — Encounter: Payer: Self-pay | Admitting: Urology

## 2019-11-26 ENCOUNTER — Telehealth: Payer: Self-pay | Admitting: Radiation Oncology

## 2019-11-26 NOTE — Telephone Encounter (Signed)
Phoned patient. Confirmed 12/9 1330 appointment with Ashlyn Bruning, PA-C. Explained this appointment will be a telephone call and not to present to [facility]. Explained we are attempting to limit covid exposure. Patient verbalized understanding. Patient verbalized [place] is visiting [place] this week but will be available for the call.

## 2019-11-28 ENCOUNTER — Ambulatory Visit
Admission: RE | Admit: 2019-11-28 | Discharge: 2019-11-28 | Disposition: A | Discharge status: Home / Self Care | Payer: 59 | Source: Ambulatory Visit | Attending: Urology | Admitting: Urology

## 2019-11-28 DIAGNOSIS — C61 Malignant neoplasm of prostate: Secondary | ICD-10-CM

## 2019-11-28 NOTE — Progress Notes (Signed)
Radiation Oncology         [phone] ________________________________  Name: Arvil Utz MRN: [id number]  Date: 11/28/2019  DOB: Apr 20, 1956  Post Treatment Note  CC: Copland, Gay Filler, MD  Copland, Gay Filler, MD  Diagnosis:   63 y.o. gentleman with Stage T1c adenocarcinoma of the prostate with Gleason score of 4+5, and PSA of 74.70  Interval Since Last Radiation:  7 weeks  09/10/19 - 10/12/19:  The prostate and pelvic nodes were treated to 45 Gy in 25 fractions of 1.8 Gy, to supplement an up-front prostate seed implant boost of 110 Gy to achieve a total nominal dose of 165 Gy; concurrent with ADT (started ADT 05/08/19).  08/10/19:  Insertion of radioactive I-125 seeds into the prostate gland; 110 Gy, boost therapy.  Narrative:  I spoke with the patient to conduct her routine scheduled 1 month follow up visit via telephone to spare the patient unnecessary potential exposure in the healthcare setting during the current COVID-19 pandemic.  The patient was notified in advance and gave permission to proceed with this visit format. He tolerated his radiation treatment relatively well with only minimal urinary irritation and modest fatigue.  He also continued to tolerate ADT throughout his course of radiation.                         On review of systems, the patient states that he is doing very well overall.  He has noticed gradual improvement in his LUTS and is quite pleased with his progress to date.  He specifically denies dysuria, gross hematuria, excessive daytime frequency, incomplete bladder emptying, straining to void or incontinence.  He reports a healthy appetite and is maintaining his weight.  He also feels that his energy level is gradually improving.  He denies abdominal pain, nausea, vomiting, diarrhea or constipation.  ALLERGIES:  is allergic to prednisone.  Meds: Current Outpatient Medications  Medication Sig Dispense Refill  . aspirin EC 81 MG tablet Take 81 mg by mouth  daily.    . cetirizine (ZYRTEC) 10 MG tablet Take 10 mg by mouth daily.    . clobetasol cream (TEMOVATE) 0.05 % APPLY TO AFFECTED AREA TWICE A DAY    . Colchicine (MITIGARE) 0.6 MG CAPS Take by mouth as needed.    . mesalamine (LIALDA) 1.2 g EC tablet Take by mouth daily with breakfast. Is taking one tablet 2 times per day    . metoprolol tartrate (LOPRESSOR) 25 MG tablet TAKE 1/2 TABLET BY MOUTH EVERY DAY 45 tablet 0  . Multiple Vitamins-Minerals (CENTRUM SILVER PO) Take 1 tablet by mouth daily.     . nitroGLYCERIN (NITROSTAT) 0.4 MG SL tablet Place 1 tablet (0.4 mg total) under the tongue every 5 (five) minutes as needed for chest pain. 25 tablet 3  . Omega-3 Fatty Acids (FISH OIL) 1200 MG CAPS Take 1 capsule by mouth at bedtime.     Marland Kitchen Phenazopyridine HCl (AZO TABS PO) Take by mouth as needed.    . rosuvastatin (CRESTOR) 20 MG tablet Take 1 tablet (20 mg total) by mouth daily. 90 tablet 3  . senna-docusate (SENOKOT-S) 8.6-50 MG tablet Take 1 tablet by mouth 2 (two) times daily. While taking strong pain meds to prevent constipation. 20 tablet 0  . simvastatin (ZOCOR) 40 MG tablet TAKE 1 TABLET BY MOUTH EVERY DAY 90 tablet 0  . tamsulosin (FLOMAX) 0.4 MG CAPS capsule Take 1 capsule (0.4 mg total) by mouth daily as needed.  For urinary urgency after prostate radiation. 30 capsule 11  . traMADol (ULTRAM) 50 MG tablet Take 1-2 tablets (50-100 mg total) by mouth every 6 (six) hours as needed for moderate pain or severe pain. Post-operatively 20 tablet 0   No current facility-administered medications for this encounter.     Physical Findings:  vitals were not taken for this visit.   /Unable to assess due to telephone follow up visit format.   Lab Findings: Lab Results  Component Value Date   WBC 5.1 08/07/2019   HGB 12.6 (L) 08/07/2019   HCT 39.4 08/07/2019   MCV 89.3 08/07/2019   PLT 240 08/07/2019     Radiographic Findings: No results found.  Impression/Plan: 1. 63 y.o. gentleman  with Stage T1c adenocarcinoma of the prostate with Gleason score of 4+5, and PSA of 74.70. He will continue to follow up with urology for ongoing PSA determinations and has an appointment scheduled with Dr. Tresa Moore in the near future for his next ADT injection. He understands what to expect with regards to PSA monitoring going forward. I will look forward to following his response to treatment via correspondence with urology, and would be happy to continue to participate in his care if clinically indicated. I talked to the patient about what to expect in the future, including his risk for erectile dysfunction and rectal bleeding. I encouraged him to call or return to the office if he has any questions regarding his previous radiation or possible radiation side effects. He was comfortable with this plan and will follow up as needed.    Nicholos Johns, PA-C

## 2019-12-09 NOTE — Progress Notes (Signed)
  Radiation Oncology         [phone]) [phone] ________________________________  Name: Tyler Estrada MRN: [id number]  Date: 10/12/2019  DOB: [birth year]  End of Treatment Note  Diagnosis:   63 y.o. gentleman with Stage T1c adenocarcinoma of the prostate with Gleason score of 4+5, and PSA of 74.70     Indication for treatment:  Curative, Definitive Radiotherapy       Radiation treatment dates:   08/10/19 and then 09/10/19-10/12/19  Site/dose:  1. Radioactive seeds were implanted into the prostate for a total of 110 Gy on 08/10/19 2. The prostate, seminal vesicles, and pelvic lymph nodes were boosted with 45 Gy in 25 fractions of 1.8 Gy, for a total dose of 155 Gy  Beams/energy:  1. The radioactive seeds were delivered under sagittal guidance with 3D imaging. 2. The prostate, seminal vesicles, and pelvic lymph nodes were initially treated using helical intensity modulated radiotherapy delivering 6 megavolt photons. Image guidance was performed with megavoltage CT studies prior to each fraction. He was immobilized with a body fix lower extremity mold.   Narrative: The patient tolerated radiation treatment relatively well. The patient experienced some minor urinary irritation and modest fatigue.    Plan: The patient has completed radiation treatment. He will return to radiation oncology clinic for routine followup in one month. I advised him to call or return sooner if he has any questions or concerns related to his recovery or treatment. ________________________________  Sheral Apley. Tammi Klippel, M.D.

## 2019-12-24 ENCOUNTER — Other Ambulatory Visit: Payer: Self-pay | Admitting: Internal Medicine

## 2020-01-02 ENCOUNTER — Encounter: Payer: Self-pay | Admitting: Family Medicine

## 2020-01-02 ENCOUNTER — Other Ambulatory Visit: Payer: Self-pay

## 2020-01-02 ENCOUNTER — Ambulatory Visit (INDEPENDENT_AMBULATORY_CARE_PROVIDER_SITE_OTHER): Payer: 59 | Admitting: Family Medicine

## 2020-01-02 VITALS — Ht 64.0 in | Wt 192.4 lb

## 2020-01-02 DIAGNOSIS — M79641 Pain in right hand: Secondary | ICD-10-CM | POA: Diagnosis not present

## 2020-01-02 DIAGNOSIS — C61 Malignant neoplasm of prostate: Secondary | ICD-10-CM | POA: Diagnosis not present

## 2020-01-02 DIAGNOSIS — R42 Dizziness and giddiness: Secondary | ICD-10-CM | POA: Diagnosis not present

## 2020-01-02 DIAGNOSIS — M79642 Pain in left hand: Secondary | ICD-10-CM

## 2020-01-02 DIAGNOSIS — R000 Tachycardia, unspecified: Secondary | ICD-10-CM

## 2020-01-02 LAB — TROPONIN I (HIGH SENSITIVITY): High Sens Troponin I: 3 ng/L (ref 2–17)

## 2020-01-02 LAB — COMPREHENSIVE METABOLIC PANEL
ALT: 20 U/L (ref 0–53)
AST: 17 U/L (ref 0–37)
Albumin: 4.1 g/dL (ref 3.5–5.2)
Alkaline Phosphatase: 83 U/L (ref 39–117)
BUN: 13 mg/dL (ref 6–23)
CO2: 28 mEq/L (ref 19–32)
Calcium: 9.2 mg/dL (ref 8.4–10.5)
Chloride: 105 mEq/L (ref 96–112)
Creatinine, Ser: 0.82 mg/dL (ref 0.40–1.50)
GFR: 94.87 mL/min (ref >60.00)
Glucose, Bld: 128 mg/dL — ABNORMAL HIGH (ref 70–99)
Potassium: 4 mEq/L (ref 3.5–5.1)
Sodium: 139 mEq/L (ref 135–145)
Total Bilirubin: 0.4 mg/dL (ref 0.2–1.2)
Total Protein: 6.9 g/dL (ref 6.0–8.3)

## 2020-01-02 LAB — TSH: TSH: 1.48 u[IU]/mL (ref 0.35–4.50)

## 2020-01-02 LAB — CBC
HCT: 36.3 % — ABNORMAL LOW (ref 39.0–52.0)
Hemoglobin: 11.9 g/dL — ABNORMAL LOW (ref 13.0–17.0)
MCHC: 32.9 g/dL (ref 30.0–36.0)
MCV: 90.1 fl (ref 78.0–100.0)
Platelets: 287 10*3/uL (ref 150.0–400.0)
RBC: 4.03 Mil/uL — ABNORMAL LOW (ref 4.22–5.81)
RDW: 13.1 % (ref 11.5–15.5)
WBC: 5.1 10*3/uL (ref 4.0–10.5)

## 2020-01-02 MED ORDER — TRAMADOL HCL 50 MG PO TABS: 50.0000 mg | ORAL_TABLET | Freq: Four times a day (QID) | ORAL | 0 refills | Status: DC | PRN

## 2020-01-02 NOTE — Progress Notes (Addendum)
[place] at [facility] [address] [place], [place] [address] [phone]  Date:  01/02/2020   Name:  Tyler Estrada   DOB:  01-Jan-1956   MRN:  [id number]  PCP:  Darreld Mclean, MD    Chief Complaint: No chief complaint on file.   History of Present Illness:  Tyler Estrada is a 64 y.o. very pleasant male patient who presents with the following:  History of prostate cancer, obesity, hyperlipidemia, CAD status post PCI, ulcerative colitis  Virtual visit today for concern of feeling lightheaded and dizzy Patient location is home, provider location is office.  Patient identity confirmed with 2 factors, he gives consent for virtual visit today.  The patient and myself are present on the call today. Due to nature of his symptoms I had him come into the office for an in-person exam following our phone conversation  Last seen by myself in October -at that time he had concern of pain in his first and second MCP joints bilaterally.  We did some lab work and also films.  Films show degenerative changes, labs were normal. He planed to follow-up with orthopedics- he did have a steroid injection in both hands, helped for a while However he notes a lot of aches how in all his joints -both his shoulders bother him especially, and he has limited range of motion He has not really tried any medication so far for his joints except for some Tylenol PM  Cardiologist is Dr. Harrington Challenger Urologist Dr. Tresa Moore  He completed radiation therapy for prostate cancer late last year- per his report, he is also on antiandrogen injection therapy.  He notes that he has lost body hair and has more central adiposity-advised him this is likely due to lack of testosterone  He saw Dr. Harrington Challenger in [place] changes made at that time  He has noted a sensation of dizziness if he bends down or moves quickly - it sounds like he is having some features of lightheadedness and  vertigo both  Does not happen with rolling over in bed Sx may last for about 2 minutes at a time He has noted these sx for 3 week- not getting better or worse Never had in the past except when his BP med was too strong in the past  His wife notes that he had orthostatic hypotension at that time No tinnitus or hearing change, no vision change He will have occasional headaches which is not typical for him  Cath 2016:  1. Patent stents in the RCA with mild in-stent restenosis 2. Moderate proximal LAD stenosis, do not suspect 'flow-limiting' lesion 3. Widely patent and large left circumflex without stenosis 4. Vigorous LV systolic function with normal LVEDP     BP Readings from Last 3 Encounters:  11/12/19 124/76  10/01/19 134/82  08/10/19 106/75     Patient Active Problem List   Diagnosis Date Noted  . Prostate cancer ([place]) 04/12/2019  . Obese 12/01/2018  . S/P left UKR 11/30/2018  . Pericarditis   . Elevated troponin 11/19/2015  . Acute pericarditis 11/19/2015  . Hyperlipidemia   . CAD S/P percutaneous coronary angioplasty 11/18/2015  . Acute chest pain 11/18/2015  . Chest pain 11/18/2015  . Iron deficiency anemia due to chronic blood loss 09/10/2015  . Ulcerative colitis with complication ([place]) 10/93/2355    Past Medical History:  Diagnosis Date  . Arthritis    knees, hips, hands  . Complication of  anesthesia    Woke up during hip replacement  . Coronary artery disease    a. s/p DES to RCA in 2008;  b. 10/2015 Cath: LM nl, LAD 50ost/p, OM1/2 min irregs, RCA 42mISR, 368m ISR, EF 65%.  . Echocardiogram    Echo 11/16: EF 60-65, normal wall motion, grade 1 diastolic dysfunction  . Grade I diastolic dysfunction 1125/42/7062 ECHO  . Hyperlipidemia   . Iron deficiency anemia due to chronic blood loss 09/10/2015  . Melanoma ([facility]   Nose  . Pericarditis    a. 10/2015->colchicine.  . Prostate cancer ([facility] [place]  . Ulcerative colitis with complication ([facility]9/3/76/2831   Past Surgical History:  Procedure Laterality Date  . CARDIAC CATHETERIZATION N/A 11/18/2015   Procedure: Left Heart Cath and Coronary Angiography;  Surgeon: MiSherren MochaMD;  [facility];  Service: Cardiovascular;  Laterality: N/A;  . COLONOSCOPY  2019  . CORONARY ANGIOPLASTY WITH STENT PLACEMENT  2008   a. Cordis stent to RCA in 2008 2.7540m 49m68m HIP ARTHROPLASTY Right   . MOHS SURGERY    . PARTIAL KNEE ARTHROPLASTY Left 11/30/2018   Procedure: LEFT UNICOMPARTMENTAL KNEE Medially;  Surgeon: OlinParalee Cancel;  [facility]: [facility];  Service: Orthopedics;  Laterality: Left;  70 mins with block  . RADIOACTIVE SEED IMPLANT N/A 08/10/2019   Procedure: RADIOACTIVE SEED IMPLANT/BRACHYTHERAPY IMPLANT;  Surgeon: MannAlexis Frock;  [facility];  Laterality: N/A;  . SPACE OAR INSTILLATION N/A 08/10/2019   Procedure: SPACE OAR INSTILLATION;  Surgeon: MannAlexis Frock;  [facility];  Laterality: N/A;  . TONSILLECTOMY     childhood    Social History   Tobacco Use  . Smoking status: Never Smoker  . Smokeless tobacco: Never Used  Substance Use Topics  . Alcohol use: Yes    Alcohol/week: 0.0 standard drinks    Comment: 1-2 drinks per week.  . Drug use: No    Family History  Problem Relation Age of Onset  . Lymphoma Mother   . Hypertension Mother   . Arthritis Mother   . Cancer Mother   . CAD Father   . Heart disease Father   . Heart attack Father   . Arthritis Sister   . Heart disease Sister   . Hypertension Brother     Allergies  Allergen Reactions  . Prednisone     Blurred vision     Medication list has been reviewed and updated.  Current Outpatient Medications on File Prior to Visit  Medication Sig Dispense Refill  . aspirin EC 81 MG tablet Take 81 mg by mouth daily.    . cetirizine (ZYRTEC) 10 MG tablet Take 10 mg by mouth daily.    . clobetasol cream  (TEMOVATE) 0.05 % APPLY TO AFFECTED AREA TWICE A DAY    . Colchicine (MITIGARE) 0.6 MG CAPS Take by mouth as needed.    . mesalamine (LIALDA) 1.2 g EC tablet Take by mouth daily with breakfast. Is taking one tablet 2 times per day    . metoprolol tartrate (LOPRESSOR) 25 MG tablet TAKE 1/2 TABLET BY MOUTH EVERY DAY 45 tablet 0  . Multiple Vitamins-Minerals (CENTRUM SILVER PO) Take 1 tablet by mouth daily.     . nitroGLYCERIN (NITROSTAT) 0.4 MG SL tablet Place 1 tablet (0.4 mg total) under the tongue every 5 (five) minutes as needed for chest pain. 25 tablet  3  . Omega-3 Fatty Acids (FISH OIL) 1200 MG CAPS Take 1 capsule by mouth at bedtime.     Marland Kitchen Phenazopyridine HCl (AZO TABS PO) Take by mouth as needed.    . rosuvastatin (CRESTOR) 20 MG tablet Take 1 tablet (20 mg total) by mouth daily. 90 tablet 3  . senna-docusate (SENOKOT-S) 8.6-50 MG tablet Take 1 tablet by mouth 2 (two) times daily. While taking strong pain meds to prevent constipation. 20 tablet 0  . simvastatin (ZOCOR) 40 MG tablet TAKE 1 TABLET BY MOUTH EVERY DAY 90 tablet 0   No current facility-administered medications on file prior to visit.    Review of Systems:  As per HPI- otherwise negative.   Physical Examination: Vitals:   01/02/20 1325  SpO2: 98%   Vitals:   01/02/20 1240  Weight: 192 lb 6.4 oz (87.3 kg)  Height: 5' 4"  (1.626 m)   Body mass index is 33.03 kg/m. Ideal Body Weight: Weight in (lb) to have BMI = 25: 145.3  GEN: WDWN, NAD, Non-toxic, A & O x 3, overweight, looks well HEENT: Atraumatic, Normocephalic. Neck supple. No masses, No LAD.  PEERL, TM wnl bilaterally a Ears and Nose: No external deformity. CV: RRR, No M/G/R. No JVD. No thrill. No extra heart sounds. PULM: CTA B, no wheezes, crackles, rhonchi. No retractions. No resp. distress. No accessory muscle use. ABD: S, NT, ND, +BS. No rebound. No HSM. EXTR: No c/c/e NEURO Normal gait.  PSYCH: Normally interactive. Conversant. Not depressed or  anxious appearing.  Calm demeanor.  dix halpike-moderately positive, worse on the right Neuro exam; normal strength, sensation, DTR of all extremities.  Normal gait.  No facial drooping or numbness he has stiffness and reduced range of motion of both shoulders  EKG: mild sinus tach rate 103, short PR  C/w EKG from 10/2019- no significant change noted   Laying 192/90  Sitting 165/94   Standing 140/86 115  111     122   Recheck blood pressure and pulse towards end of visit-140/80, 99 Assessment and Plan: Lightheaded  Tachycardia - Plan: EKG 12-Lead, CBC, Comprehensive metabolic panel, TSH, Troponin I, Troponin I (High Sensitivity)  Pain in both hands  Prostate cancer (HCC)  On person visit today for feeling of lightheadedness and possibly some element of vertigo for about 3 weeks.  His symptoms are not necessarily consistent with BPPV.  His blood pressure is somewhat variable today with tachycardia.  He denies any shortness of breath.  He notes rare occasions of chest pain which have been present and unchanged for the last 5 years or so, and which prompted his original cardiac catheterization  Discussed in detail with patient today- his symptoms are somewhat difficult to diagnose.  Certainly he can be seen in the ER today if he would like-he declines ER visit, will plan to pursue outpatient work-up  Labs pending as above I will touch base with Dr. Harrington Challenger about his EKG and blood pressure medical decision making today is moderate  This visit occurred during the SARS-CoV-2 public health emergency.  Safety protocols were in place, including screening questions prior to the visit, additional usage of staff PPE, and extensive cleaning of exam room while observing appropriate contact time as indicated for disinfecting solutions.   Signed Lamar Blinks, MD  Received his labs as below  Results for orders placed or performed in visit on 01/02/20  CBC  Result Value Ref Range   WBC 5.1 4.0  - 10.5 K/uL  RBC 4.03 (L) 4.22 - 5.81 Mil/uL   Platelets 287.0 150.0 - 400.0 K/uL   Hemoglobin 11.9 (L) 13.0 - 17.0 g/dL   HCT 36.3 (L) 39.0 - 52.0 %   MCV 90.1 78.0 - 100.0 fl   MCHC 32.9 30.0 - 36.0 g/dL   RDW 13.1 11.5 - 15.5 %  Comprehensive metabolic panel  Result Value Ref Range   Sodium 139 135 - 145 mEq/L   Potassium 4.0 3.5 - 5.1 mEq/L   Chloride 105 96 - 112 mEq/L   CO2 28 19 - 32 mEq/L   Glucose, Bld 128 (H) 70 - 99 mg/dL   BUN 13 6 - 23 mg/dL   Creatinine, Ser 0.82 0.40 - 1.50 mg/dL   Total Bilirubin 0.4 0.2 - 1.2 mg/dL   Alkaline Phosphatase 83 39 - 117 U/L   AST 17 0 - 37 U/L   ALT 20 0 - 53 U/L   Total Protein 6.9 6.0 - 8.3 g/dL   Albumin 4.1 3.5 - 5.2 g/dL   GFR 94.87 >60.00 mL/min   Calcium 9.2 8.4 - 10.5 mg/dL  TSH  Result Value Ref Range   TSH 1.48 0.35 - 4.50 uIU/mL  Troponin I (High Sensitivity)  Result Value Ref Range   High Sens Troponin I 3 2 - 17 ng/L    Addendum 1/14 Called patient to check on him due to unusual symptoms He is feeling no worse, about the same Asked him to reach out to his cardiologist, Dr. Harrington Challenger, and he agrees to do so He will keep me posted

## 2020-01-02 NOTE — Patient Instructions (Addendum)
It was good to see you today I will be in touch with your labs asap For your shoulder pain, please do see your orthopedist.  Use tylenol as needed I gave you a few tramadol to use if needed for more severe pain If anything is changing or getting worse please contact me

## 2020-01-03 ENCOUNTER — Encounter: Payer: Self-pay | Admitting: Family Medicine

## 2020-01-07 ENCOUNTER — Encounter: Payer: Self-pay | Admitting: Family Medicine

## 2020-01-07 ENCOUNTER — Ambulatory Visit: Payer: 59 | Admitting: Family Medicine

## 2020-01-10 ENCOUNTER — Other Ambulatory Visit: Payer: Self-pay | Admitting: Family Medicine

## 2020-01-10 ENCOUNTER — Encounter: Payer: Self-pay | Admitting: Family Medicine

## 2020-01-10 MED ORDER — MECLIZINE HCL 25 MG PO TABS: 12.5000 mg | ORAL_TABLET | Freq: Three times a day (TID) | ORAL | 0 refills | Status: DC | PRN

## 2020-01-10 NOTE — Progress Notes (Unsigned)
Pt came in for repeat Orthostatic VS today Laying 145/84, P 82

## 2020-01-17 ENCOUNTER — Telehealth: Payer: Self-pay | Admitting: Internal Medicine

## 2020-01-17 NOTE — Telephone Encounter (Signed)
Spoke to patient  He generally has not felt well.  He times to last testosterone injection   ? A different formulation He says he is a little dizzy with standing for a few seconds and with bending to putting head upright    If standing for awhile he is not dizzy  Also complains of achiness   Recomm:  1.  Increase hydration   He says urine is darker 2  Get BP cuff and follow   Call if low or high 3  Stop Crestor  Follow achiness 4.  Let Dr Tresa Moore know as well  ? Formulation of testosterone he got  F/U in mychart in 7 to 10 days

## 2020-01-18 ENCOUNTER — Other Ambulatory Visit: Payer: 59

## 2020-02-04 ENCOUNTER — Telehealth: Payer: Self-pay | Admitting: Radiation Oncology

## 2020-02-04 NOTE — Telephone Encounter (Signed)
Received voicemail message from patient requesting a return call about armpit pain. Phoned patient back. Patient explains he hasn't been feeling well since December. He reports nausea, dizziness and spells where he feels like he is going to pass out. The patient reports armpit pain worse on the right than the left. He reports pain in all his major joints. Reports occasional dysuria at start of urination. Denies hematuria, urgency or frequency. Reports hot flashes related to ADT. Reports he continues to take Flomax as prescribed. Patient confirms he received an ADT injection in December. Patient reports he spoke with Dr. Tresa Moore, his urologist, and he didn't feel like his symptoms were related to ADT. Patient reports his cardiologist was unable to find any abnormalities. Patient reports his PCP stopped all his cholesterol medication three weeks ago but that didn't seem to help. This RN explained his symptoms are unrelated to prostate radiation. Reinforced radiation side effects are localized to the area of treatment. Patient requested this RN inform Dr. Tammi Klippel of all his symptoms and get it thoughts. Patient states, "this is my last stop in an attempt to try and find out what is wrong with me."  Patient understands this RN will phone him back with Dr. Johny Shears thoughts.   Diagnosis:   64 y.o. gentleman with Stage T1c adenocarcinoma of the prostate with Gleason score of 4+5, and PSA of 74.70  Interval Since Last Radiation:  7 weeks  09/10/19 - 10/12/19:  The prostate and pelvic nodes were treated to 45 Gy in 25 fractions of 1.8 Gy, to supplement an up-front prostate seed implant boost of 110 Gy to achieve a total nominal dose of 165 Gy; concurrent with ADT (started ADT 05/08/19).  08/10/19:  Insertion of radioactive I-125 seeds into the prostate gland;110Gy, boost therapy.

## 2020-02-06 ENCOUNTER — Encounter: Payer: Self-pay | Admitting: Family Medicine

## 2020-02-06 ENCOUNTER — Telehealth: Payer: Self-pay | Admitting: Radiation Oncology

## 2020-02-06 NOTE — Progress Notes (Signed)
[place] at [facility] [address], [place], [place] [address] [phone]  Date:  02/07/2020   Name:  Tyler Estrada   DOB:  Mar 09, 1956   MRN:  [id number]  PCP:  Darreld Mclean, MD    Chief Complaint: No chief complaint on file.   History of Present Illness:  Tyler Estrada is a 64 y.o. very pleasant male patient who presents with the following: Virtual visit today for concern of persistent vertigo.  Patient location is were, provider location is home Virtual visit today due to inclement weather. Patient identity confirmed with 2 factors, he gives consent for virtual visit today.  The patient and myself are present on the call today  Patient seen by myself just over a month ago with lightheadedness, dizziness History of prostate cancer, obesity, hyperlipidemia, CAD status post PCI, ulcerative colitis He completed radiation therapy for prostate cancer last year, he is on antiandrogen injection therapy At our last visit he also had elevated blood pressure.  We had him come in for a follow-up visit, his blood pressure was better.  I have him try Epley maneuvers at home and prescribed meclizine- neither helped that much  He did hear back from Dr Harrington Challenger We tried stopping his statin to see if that would help- he maybe feels better, he will stay off it for now  He saw his ortho Dr Jeannie Fend with [facility] regarding his shoulder pain They found some degen changes but did not look awful He has noted pain in both armpits- right more than left, for about one month.  Will come and go He did not think the right armpit pain he has experienced is related to the shoulders, did not think his shoulder disease is significant enough to explain all the symptoms.  They did not do a neck evaluation per patient report  He spoke with Dr Tresa Moore- they did not think his cancer treatment would cause these sx They did suggest doing a PSA to monitor for  evidence of recurrence  He has been checking his BP at home- running 140-145/80 Pulse ok- he is not sure of the rate but his machine tells him ok  Dizziness occurs daily but not as severe as it was  He only get the sx when he moves his head- when still he is ok  If he looks up at the sky to check the weather he notes vertigo symptoms No falls No headache He may notice that his right arm goes numb at times- this has been present for 4-6 weeks It seems to be positional- laying flat on his back generally will help He is also having some neck pain  He does not see neurology as of yet-we discussed the consultation, and he would like to pursue this   Patient Active Problem List   Diagnosis Date Noted  . Prostate cancer ([place]) 04/12/2019  . Obese 12/01/2018  . S/P left UKR 11/30/2018  . Pericarditis   . Elevated troponin 11/19/2015  . Acute pericarditis 11/19/2015  . Hyperlipidemia   . CAD S/P percutaneous coronary angioplasty 11/18/2015  . Acute chest pain 11/18/2015  . Chest pain 11/18/2015  . Iron deficiency anemia due to chronic blood loss 09/10/2015  . Ulcerative colitis with complication ([place]) 67/20/9470    Past Medical History:  Diagnosis Date  . Arthritis    knees, hips, hands  . Complication of anesthesia    Woke up during hip replacement  .  Coronary artery disease    a. s/p DES to RCA in 2008;  b. 10/2015 Cath: LM nl, LAD 50ost/p, OM1/2 min irregs, RCA 90mISR, 362m ISR, EF 65%.  . Echocardiogram    Echo 11/16: EF 60-65, normal wall motion, grade 1 diastolic dysfunction  . Grade I diastolic dysfunction 1142/70/6237 ECHO  . Hyperlipidemia   . Iron deficiency anemia due to chronic blood loss 09/10/2015  . Melanoma ([facility]   Nose  . Pericarditis    a. 10/2015->colchicine.  . Prostate cancer ([facility]  . Ulcerative colitis with complication ([facility]9/06/16/3150  Past Surgical History:  Procedure Laterality Date  . CARDIAC CATHETERIZATION N/A 11/18/2015   Procedure:  Left Heart Cath and Coronary Angiography;  Surgeon: MiSherren MochaMD;  [facility];  Service: Cardiovascular;  Laterality: N/A;  . COLONOSCOPY  2019  . CORONARY ANGIOPLASTY WITH STENT PLACEMENT  2008   a. Cordis stent to RCA in 2008 2.7569m 28m34m HIP ARTHROPLASTY Right   . MOHS SURGERY    . PARTIAL KNEE ARTHROPLASTY Left 11/30/2018   Procedure: LEFT UNICOMPARTMENTAL KNEE Medially;  Surgeon: OlinParalee Cancel;  [facility];  Service: Orthopedics;  Laterality: Left;  70 mins with block  . RADIOACTIVE SEED IMPLANT N/A 08/10/2019   Procedure: RADIOACTIVE SEED IMPLANT/BRACHYTHERAPY IMPLANT;  Surgeon: MannAlexis Frock;  [facility];  Laterality: N/A;  . SPACE OAR INSTILLATION N/A 08/10/2019   Procedure: SPACE OAR INSTILLATION;  Surgeon: MannAlexis Frock;  [facility];  Laterality: N/A;  . TONSILLECTOMY     childhood    Social History   Tobacco Use  . Smoking status: Never Smoker  . Smokeless tobacco: Never Used  Substance Use Topics  . Alcohol use: Yes    Alcohol/week: 0.0 standard drinks    Comment: 1-2 drinks per week.  . Drug use: No    Family History  Problem Relation Age of Onset  . Lymphoma Mother   . Hypertension Mother   . Arthritis Mother   . Cancer Mother   . CAD Father   . Heart disease Father   . Heart attack Father   . Arthritis Sister   . Heart disease Sister   . Hypertension Brother     Allergies  Allergen Reactions  . Prednisone     Blurred vision     Medication list has been reviewed and updated.  Current Outpatient Medications on File Prior to Visit  Medication Sig Dispense Refill  . aspirin EC 81 MG tablet Take 81 mg by mouth daily.    . cetirizine (ZYRTEC) 10 MG tablet Take 10 mg by mouth daily.    . clobetasol cream (TEMOVATE) 0.05 % APPLY TO AFFECTED AREA TWICE A DAY    . Colchicine (MITIGARE) 0.6 MG CAPS Take by mouth as  needed.    . meclizine (ANTIVERT) 25 MG tablet Take 0.5-1 tablets (12.5-25 mg total) by mouth 3 (three) times daily as needed for dizziness. 30 tablet 0  . mesalamine (LIALDA) 1.2 g EC tablet Take by mouth daily with breakfast. Is taking one tablet 2 times per day    . metoprolol tartrate (LOPRESSOR) 25 MG tablet TAKE 1/2 TABLET BY MOUTH EVERY DAY 45 tablet 0  . Multiple Vitamins-Minerals (CENTRUM SILVER PO) Take 1 tablet by mouth daily.     . nitroGLYCERIN (NITROSTAT) 0.4 MG SL tablet Place 1 tablet (0.4 mg  total) under the tongue every 5 (five) minutes as needed for chest pain. 25 tablet 3  . Omega-3 Fatty Acids (FISH OIL) 1200 MG CAPS Take 1 capsule by mouth at bedtime.     Marland Kitchen Phenazopyridine HCl (AZO TABS PO) Take by mouth as needed.    . rosuvastatin (CRESTOR) 20 MG tablet Take 1 tablet (20 mg total) by mouth daily. 90 tablet 3  . senna-docusate (SENOKOT-S) 8.6-50 MG tablet Take 1 tablet by mouth 2 (two) times daily. While taking strong pain meds to prevent constipation. 20 tablet 0  . simvastatin (ZOCOR) 40 MG tablet TAKE 1 TABLET BY MOUTH EVERY DAY 90 tablet 0  . traMADol (ULTRAM) 50 MG tablet Take 1-2 tablets (50-100 mg total) by mouth every 6 (six) hours as needed for moderate pain or severe pain. Post-operatively 20 tablet 0   No current facility-administered medications on file prior to visit.    Review of Systems:  As per HPI- otherwise negative.   Physical Examination: There were no vitals filed for this visit. There were no vitals filed for this visit. There is no height or weight on file to calculate BMI. Ideal Body Weight:    Patient observed over video monitor.  He appears his normal self, no distress.  Assessment and Plan: Prostate cancer ([place]) - Plan: PSA  Lightheaded - Plan: DG Cervical Spine Complete, Ambulatory referral to Neurology  Right arm numbness - Plan: DG Cervical Spine Complete, Ambulatory referral to Neurology  Visit today for persistent vertigo,  seems to be positional.  Question related to his neck versus BPPV.  I ordered plain films of his cervical spine, he will try to have these done today if possible.  Referral to neurology placed.  I ordered a PSA to be drawn at next opportunity.  Asked him to let me know if any change or worsening of his symptoms in the meantime He has consulted with his orthopedist, urologist, and cardiologist as well.  So far no definite explanation for his symptoms has been discovered  Moderate medical decision making today  Signed Lamar Blinks, MD

## 2020-02-06 NOTE — Telephone Encounter (Signed)
Phoned patient. Explained that Dr. Tammi Klippel does not see a relation between his armpit pain and his previous prostate radiation or androgen deprivation. Explained that Dr. Tammi Klippel has offered to do a video visit to assess the pain location. Patient reports he has an appointment with Dr. Edilia Bo tomorrow at 41 and plans to see where that goes. Patient expressed appreciation for the return call.

## 2020-02-07 ENCOUNTER — Ambulatory Visit (INDEPENDENT_AMBULATORY_CARE_PROVIDER_SITE_OTHER): Payer: 59 | Admitting: Family Medicine

## 2020-02-07 ENCOUNTER — Encounter: Payer: Self-pay | Admitting: Family Medicine

## 2020-02-07 ENCOUNTER — Other Ambulatory Visit: Payer: Self-pay

## 2020-02-07 DIAGNOSIS — R42 Dizziness and giddiness: Secondary | ICD-10-CM

## 2020-02-07 DIAGNOSIS — C61 Malignant neoplasm of prostate: Secondary | ICD-10-CM

## 2020-02-07 DIAGNOSIS — R200 Anesthesia of skin: Secondary | ICD-10-CM | POA: Diagnosis not present

## 2020-02-08 ENCOUNTER — Encounter: Payer: Self-pay | Admitting: Family Medicine

## 2020-02-08 ENCOUNTER — Ambulatory Visit (HOSPITAL_BASED_OUTPATIENT_CLINIC_OR_DEPARTMENT_OTHER)
Admission: RE | Admit: 2020-02-08 | Discharge: 2020-02-08 | Disposition: A | Discharge status: Home / Self Care | Payer: 59 | Source: Ambulatory Visit | Attending: Family Medicine | Admitting: Family Medicine

## 2020-02-08 ENCOUNTER — Other Ambulatory Visit: Payer: Self-pay

## 2020-02-08 ENCOUNTER — Other Ambulatory Visit (INDEPENDENT_AMBULATORY_CARE_PROVIDER_SITE_OTHER): Payer: 59

## 2020-02-08 DIAGNOSIS — M542 Cervicalgia: Secondary | ICD-10-CM

## 2020-02-08 DIAGNOSIS — C61 Malignant neoplasm of prostate: Secondary | ICD-10-CM

## 2020-02-08 DIAGNOSIS — R42 Dizziness and giddiness: Secondary | ICD-10-CM | POA: Diagnosis present

## 2020-02-08 DIAGNOSIS — R200 Anesthesia of skin: Secondary | ICD-10-CM

## 2020-02-08 LAB — PSA: PSA: 0 ng/mL — ABNORMAL LOW (ref 0.10–4.00)

## 2020-02-08 NOTE — Progress Notes (Signed)
Lab Results  Component Value Date   PSA1 94.7 (H) 01/15/2019   PSA 0.00 (L) 02/08/2020

## 2020-02-08 NOTE — Telephone Encounter (Signed)
Pt states that My chart was down to respond . But wanted to respond and say Yes MRI would be fine with that's what you feel that needs to be done

## 2020-02-11 NOTE — Telephone Encounter (Signed)
Does patient need approval?

## 2020-02-13 ENCOUNTER — Encounter: Payer: Self-pay | Admitting: Family Medicine

## 2020-02-23 ENCOUNTER — Other Ambulatory Visit (HOSPITAL_BASED_OUTPATIENT_CLINIC_OR_DEPARTMENT_OTHER): Payer: Self-pay | Admitting: Family Medicine

## 2020-02-23 ENCOUNTER — Ambulatory Visit (HOSPITAL_BASED_OUTPATIENT_CLINIC_OR_DEPARTMENT_OTHER)
Admission: RE | Admit: 2020-02-23 | Discharge: 2020-02-23 | Disposition: A | Discharge status: Home / Self Care | Payer: 59 | Source: Ambulatory Visit | Attending: Family Medicine | Admitting: Family Medicine

## 2020-02-23 ENCOUNTER — Other Ambulatory Visit: Payer: Self-pay

## 2020-02-23 DIAGNOSIS — R200 Anesthesia of skin: Secondary | ICD-10-CM | POA: Insufficient documentation

## 2020-02-23 DIAGNOSIS — M542 Cervicalgia: Secondary | ICD-10-CM | POA: Insufficient documentation

## 2020-02-26 ENCOUNTER — Other Ambulatory Visit: Payer: Self-pay | Admitting: Family Medicine

## 2020-02-26 ENCOUNTER — Encounter: Payer: Self-pay | Admitting: Family Medicine

## 2020-02-26 DIAGNOSIS — R200 Anesthesia of skin: Secondary | ICD-10-CM

## 2020-02-27 ENCOUNTER — Other Ambulatory Visit: Payer: Self-pay

## 2020-02-27 ENCOUNTER — Encounter: Payer: Self-pay | Admitting: Family Medicine

## 2020-02-27 ENCOUNTER — Ambulatory Visit (INDEPENDENT_AMBULATORY_CARE_PROVIDER_SITE_OTHER): Payer: 59 | Admitting: Family Medicine

## 2020-02-27 VITALS — BP 136/82 | HR 80 | Temp 96.8°F | Resp 16 | Ht 64.0 in | Wt 197.0 lb

## 2020-02-27 DIAGNOSIS — R200 Anesthesia of skin: Secondary | ICD-10-CM

## 2020-02-27 MED ORDER — METHYLPREDNISOLONE ACETATE 40 MG/ML IJ SUSP
40.0000 mg | Freq: Once | INTRAMUSCULAR | Status: AC
  Administered 2020-02-27: 40 mg via INTRAMUSCULAR

## 2020-02-27 NOTE — Patient Instructions (Addendum)
Please give neurosurgery a call if they don't contact you in the next couple of days [facility]  Address: [address], [place], [place] [address] Phone: [phone]  You got a shot of depo medrol today- injectable steroid Please let me know how this works out for you  [employer]

## 2020-02-27 NOTE — Progress Notes (Signed)
[name] at [facility] [address] [place], [place] [address] [phone]  Date:  02/27/2020   Name:  Tyler Estrada   DOB:  11-06-1956   MRN:  [id number]  PCP:  Darreld Mclean, MD    Chief Complaint: Neck Pain   History of Present Illness:  Tyler Estrada is a 64 y.o. very pleasant male patient who presents with the following:  Pt who has been suffering from neck pain and right arm numbness for over a month- recent MRI as follows:  IMPRESSION: Broad-based central protrusion at C6-7 flattens the cord. Moderate to moderately severe foraminal narrowing at this level is worse on the right.  Broad-based central protrusion at C5-6 deforms the cord, worse on the right. Mild bilateral foraminal narrowing is present at C5-6.  Flattening of the ventral cord and severe bilateral foraminal narrowing at C4-5 due to disc osteophyte complex and uncovertebral Spurring.  We have referred him to NSG but thinking about sx relief in the meantime.  He is not able to tolerate oral pred due to blurred vision; he notes no history of hives, rash, swelling, wheezing or other serious allergic reaction symptoms in the past.  He has had steroid injections in his joints with "cortisone" in the past and tolerated well At this time he is having both pain and numbness in his right arm-the numbness tends to be worse at night and in the morning His neck is snapping and popping The left arm is sometimes numb He will sometimes get a pins and needles feeling in his right leg and foot-this is relatively new  He is using CBD at bedtime for pain- it helps him sleep  Suggested a cervical spine collar to use prn for comfort  He had his first covid shot a week ago  No history of diabetes Patient Active Problem List   Diagnosis Date Noted  . Prostate cancer ([place]) 04/12/2019  . Obese 12/01/2018  . S/P left UKR 11/30/2018  . Pericarditis   . Elevated  troponin 11/19/2015  . Acute pericarditis 11/19/2015  . Hyperlipidemia   . CAD S/P percutaneous coronary angioplasty 11/18/2015  . Acute chest pain 11/18/2015  . Chest pain 11/18/2015  . Iron deficiency anemia due to chronic blood loss 09/10/2015  . Ulcerative colitis with complication ([place]) 28/76/8115    Past Medical History:  Diagnosis Date  . Arthritis    knees, hips, hands  . Complication of anesthesia    Woke up during hip replacement  . Coronary artery disease    a. s/p DES to RCA in 2008;  b. 10/2015 Cath: LM nl, LAD 50ost/p, OM1/2 min irregs, RCA 45mISR, 375m ISR, EF 65%.  . Echocardiogram    Echo 11/16: EF 60-65, normal wall motion, grade 1 diastolic dysfunction  . Grade I diastolic dysfunction 1172/62/0355 ECHO  . Hyperlipidemia   . Iron deficiency anemia due to chronic blood loss 09/10/2015  . Melanoma ([facility]   Nose  . Pericarditis    a. 10/2015->colchicine.  . [place] cancer ([facility] [place]  . Ulcerative colitis with complication ([facility] [place]9/9/74/1638  Past Surgical History:  Procedure Laterality Date  . CARDIAC CATHETERIZATION N/A 11/18/2015   Procedure: Left Heart Cath and Coronary Angiography;  Surgeon: MiSherren MochaMD;  [facility];  Service: Cardiovascular;  Laterality: N/A;  . COLONOSCOPY  2019  . CORONARY ANGIOPLASTY WITH STENT PLACEMENT  2008   a. Cordis  stent to RCA in 2008 2.39m x 119m . HIP ARTHROPLASTY Right   . MOHS SURGERY    . PARTIAL KNEE ARTHROPLASTY Left 11/30/2018   Procedure: LEFT UNICOMPARTMENTAL KNEE Medially;  Surgeon: OlParalee CancelMD;  Location: [facility];  Service: Orthopedics;  Laterality: Left;  70 mins with block  . RADIOACTIVE SEED IMPLANT N/A 08/10/2019   Procedure: RADIOACTIVE SEED IMPLANT/BRACHYTHERAPY IMPLANT;  Surgeon: MaAlexis FrockMD;  [facility];  Laterality: N/A;  . SPACE OAR INSTILLATION N/A 08/10/2019   Procedure: SPACE OAR INSTILLATION;  Surgeon: MaAlexis Frock MD;  [facility] Service: [facility]rology;  Laterality: N/A;  . TONSILLECTOMY     childhood    Social History   Tobacco Use  . Smoking status: Never Smoker  . Smokeless tobacco: Never Used  Substance Use Topics  . Alcohol use: Yes    Alcohol/week: 0.0 standard drinks    Comment: 1-2 drinks per week.  . Drug use: No    Family History  Problem Relation Age of Onset  . Lymphoma Mother   . Hypertension Mother   . Arthritis Mother   . Cancer Mother   . CAD Father   . Heart disease Father   . Heart attack Father   . Arthritis Sister   . Heart disease Sister   . Hypertension Brother     Allergies  Allergen Reactions  . Prednisone     Blurred vision     Medication list has been reviewed and updated.  Current Outpatient Medications on File Prior to Visit  Medication Sig Dispense Refill  . aspirin EC 81 MG tablet Take 81 mg by mouth daily.    . cetirizine (ZYRTEC) 10 MG tablet Take 10 mg by mouth daily.    . clobetasol cream (TEMOVATE) 0.05 % APPLY TO AFFECTED AREA TWICE A DAY    . Colchicine (MITIGARE) 0.6 MG CAPS Take by mouth as needed.    . meclizine (ANTIVERT) 25 MG tablet Take 0.5-1 tablets (12.5-25 mg total) by mouth 3 (three) times daily as needed for dizziness. 30 tablet 0  . mesalamine (LIALDA) 1.2 g EC tablet Take by mouth daily with breakfast. Is taking one tablet 2 times per day    . metoprolol tartrate (LOPRESSOR) 25 MG tablet TAKE 1/2 TABLET BY MOUTH EVERY DAY 45 tablet 0  . Multiple Vitamins-Minerals (CENTRUM SILVER PO) Take 1 tablet by mouth daily.     . nitroGLYCERIN (NITROSTAT) 0.4 MG SL tablet Place 1 tablet (0.4 mg total) under the tongue every 5 (five) minutes as needed for chest pain. 25 tablet 3  . Omega-3 Fatty Acids (FISH OIL) 1200 MG CAPS Take 1 capsule by mouth at bedtime.     . Marland Kitchenhenazopyridine HCl (AZO TABS PO) Take by mouth as needed.    . rosuvastatin (CRESTOR) 20 MG tablet Take 1 tablet (20 mg total) by mouth daily. 90  tablet 3  . senna-docusate (SENOKOT-S) 8.6-50 MG tablet Take 1 tablet by mouth 2 (two) times daily. While taking strong pain meds to prevent constipation. 20 tablet 0  . traMADol (ULTRAM) 50 MG tablet Take 1-2 tablets (50-100 mg total) by mouth every 6 (six) hours as needed for moderate pain or severe pain. Post-operatively 20 tablet 0   No current facility-administered medications on file prior to visit.    Review of Systems:  As per HPI- otherwise negative.   Physical Examination: Vitals:   02/27/20 1048  BP: 136/82  Pulse: 80  Resp: 16  Temp: (!) 96.8 F (36 C)  SpO2: 100%   Vitals:   02/27/20 1048  Weight: 197 lb (89.4 kg)  Height: 5' 4"  (1.626 m)   Body mass index is 33.81 kg/m. Ideal Body Weight: Weight in (lb) to have BMI = 25: 145.3  GEN: no acute distress.  Obese, looks well HEENT: Atraumatic, Normocephalic.  Ears and Nose: No external deformity. CV: RRR, No M/G/R. No JVD. No thrill. No extra heart sounds. PULM: CTA B, no wheezes, crackles, rhonchi. No retractions. No resp. distress. No accessory muscle use. EXTR: No c/c/e PSYCH: Normally interactive. Conversant.  Normal strength and DTR of bilateral upper extremities  Assessment and Plan: Right arm numbness - Plan: methylPREDNISolone acetate (DEPO-MEDROL) injection 40 mg  Here today to follow-up on symptoms from cervical spine degenerative disease, he is having pain and also numbness in his right arm.  He has been referred to neurosurgery and we expect that they will see him soon.  Typically would try oral prednisone, but he does not tolerate this well.  Gave him a shot of Depo-Medrol instead after discussing risks and benefits.  He feels comfortable with this injection-will let me know if any allergic reaction  This visit occurred during the SARS-CoV-2 public health emergency.  Safety protocols were in place, including screening questions prior to the visit, additional usage of staff PPE, and extensive cleaning  of exam room while observing appropriate contact time as indicated for disinfecting solutions.     Signed Lamar Blinks, MD

## 2020-03-04 ENCOUNTER — Encounter: Payer: Self-pay | Admitting: Family Medicine

## 2020-03-05 ENCOUNTER — Other Ambulatory Visit: Payer: Self-pay | Admitting: Neurosurgery

## 2020-03-08 ENCOUNTER — Other Ambulatory Visit: Payer: 59

## 2020-03-17 ENCOUNTER — Encounter: Payer: Self-pay | Admitting: Family Medicine

## 2020-03-19 ENCOUNTER — Other Ambulatory Visit: Payer: Self-pay | Admitting: Internal Medicine

## 2020-03-19 NOTE — Progress Notes (Signed)
[facility] [place] - [address] [place] [address] Phone: [phone] Fax: [phone]      Your procedure is scheduled on Wednesday April 7  Report to [facility] Main Entrance "A" at 0700 A.M., and check in at the Admitting office.  Call this number if you have problems the morning of surgery:  [phone]  Call [phone] if you have any questions prior to your surgery date Monday-Friday 8am-4pm    Remember:  Do not eat or drink after midnight the night before your surgery    Take these medicines the morning of surgery with A SIP OF WATER  loratadine (CLARITIN metoprolol tartrate (LOPRESSOR) nitroGLYCERIN (NITROSTAT) if needed for chest pain - notify RN if taken  Follow your surgeon's instructions on when to stop Aspirin.  If no instructions were given by your surgeon then you will need to call the office to get those instructions.    As of today, STOP taking any Aspirin containing products, Aleve, Naproxen, Ibuprofen, Motrin, Advil, Goody's, BC's, all herbal medications, fish oil, and all vitamins.                      Do not wear jewelry            Do not wear lotions, powders, colognes, or deodorant.            Men may shave face and neck.            Do not bring valuables to the hospital.            [facility] is not responsible for any belongings or valuables.  Do NOT Smoke (Tobacco/Vapping) or drink Alcohol 24 hours prior to your procedure If you use a CPAP at night, you may bring all equipment for your overnight stay.   Contacts, glasses, dentures or bridgework may not be worn into surgery.      For patients admitted to the hospital, discharge time will be determined by your treatment team.   Patients discharged the day of surgery will not be allowed to drive home, and someone needs to stay with them for 24 hours.    Special instructions:   [place]- Preparing For Surgery  Before surgery, you can play  an important role. Because skin is not sterile, your skin needs to be as free of germs as possible. You can reduce the number of germs on your skin by washing with CHG (chlorahexidine gluconate) Soap before surgery.  CHG is an antiseptic cleaner which kills germs and bonds with the skin to continue killing germs even after washing.    Oral Hygiene is also important to reduce your risk of infection.  Remember - BRUSH YOUR TEETH THE MORNING OF SURGERY WITH YOUR REGULAR TOOTHPASTE  Please do not use if you have an allergy to CHG or antibacterial soaps. If your skin becomes reddened/irritated stop using the CHG.  Do not shave (including legs and underarms) for at least 48 hours prior to first CHG shower. It is OK to shave your face.  Please follow these instructions carefully.   1. Shower the NIGHT BEFORE SURGERY and the MORNING OF SURGERY with CHG Soap.   2. If you chose to wash your hair, wash your hair first as usual with your normal shampoo.  3. After you shampoo, rinse your hair and body thoroughly to remove the shampoo.  4. Use CHG as you would any other liquid soap.  You can apply CHG directly to the skin and wash gently with a scrungie or a clean washcloth.   5. Apply the CHG Soap to your body ONLY FROM THE NECK DOWN.  Do not use on open wounds or open sores. Avoid contact with your eyes, ears, mouth and genitals (private parts). Wash Face and genitals (private parts)  with your normal soap.   6. Wash thoroughly, paying special attention to the area where your surgery will be performed.  7. Thoroughly rinse your body with warm water from the neck down.  8. DO NOT shower/wash with your normal soap after using and rinsing off the CHG Soap.  9. Pat yourself dry with a CLEAN TOWEL.  10. Wear CLEAN PAJAMAS to bed the night before surgery, wear comfortable clothes the morning of surgery  11. Place CLEAN SHEETS on your bed the night of your first shower and DO NOT SLEEP WITH PETS.   Day  of Surgery:   Do not apply any deodorants/lotions.  Please wear clean clothes to the hospital/surgery center.   Remember to brush your teeth WITH YOUR REGULAR TOOTHPASTE.   Please read over the following fact sheets that you were given.

## 2020-03-20 ENCOUNTER — Encounter (HOSPITAL_COMMUNITY)
Admission: RE | Admit: 2020-03-20 | Discharge: 2020-03-20 | Disposition: A | Discharge status: Home / Self Care | Payer: 59 | Source: Ambulatory Visit | Attending: Neurosurgery | Admitting: Neurosurgery

## 2020-03-20 ENCOUNTER — Ambulatory Visit: Payer: Self-pay | Admitting: Neurology

## 2020-03-20 ENCOUNTER — Other Ambulatory Visit: Payer: Self-pay

## 2020-03-20 ENCOUNTER — Encounter (HOSPITAL_COMMUNITY): Payer: Self-pay

## 2020-03-20 DIAGNOSIS — M542 Cervicalgia: Secondary | ICD-10-CM | POA: Insufficient documentation

## 2020-03-20 DIAGNOSIS — D509 Iron deficiency anemia, unspecified: Secondary | ICD-10-CM | POA: Diagnosis not present

## 2020-03-20 DIAGNOSIS — E785 Hyperlipidemia, unspecified: Secondary | ICD-10-CM | POA: Diagnosis not present

## 2020-03-20 DIAGNOSIS — R200 Anesthesia of skin: Secondary | ICD-10-CM | POA: Insufficient documentation

## 2020-03-20 DIAGNOSIS — M47812 Spondylosis without myelopathy or radiculopathy, cervical region: Secondary | ICD-10-CM | POA: Insufficient documentation

## 2020-03-20 DIAGNOSIS — Z7901 Long term (current) use of anticoagulants: Secondary | ICD-10-CM | POA: Diagnosis not present

## 2020-03-20 DIAGNOSIS — Z79899 Other long term (current) drug therapy: Secondary | ICD-10-CM | POA: Insufficient documentation

## 2020-03-20 DIAGNOSIS — R42 Dizziness and giddiness: Secondary | ICD-10-CM | POA: Diagnosis not present

## 2020-03-20 DIAGNOSIS — Z01812 Encounter for preprocedural laboratory examination: Secondary | ICD-10-CM | POA: Diagnosis not present

## 2020-03-20 DIAGNOSIS — Z7982 Long term (current) use of aspirin: Secondary | ICD-10-CM | POA: Diagnosis not present

## 2020-03-20 LAB — CBC
HCT: 36.8 % — ABNORMAL LOW (ref 39.0–52.0)
Hemoglobin: 11.8 g/dL — ABNORMAL LOW (ref 13.0–17.0)
MCH: 28.2 pg (ref 26.0–34.0)
MCHC: 32.1 g/dL (ref 30.0–36.0)
MCV: 88 fL (ref 80.0–100.0)
Platelets: 339 10*3/uL (ref 150–400)
RBC: 4.18 MIL/uL — ABNORMAL LOW (ref 4.22–5.81)
RDW: 12.9 % (ref 11.5–15.5)
WBC: 6.4 10*3/uL (ref 4.0–10.5)
nRBC: 0 % (ref 0.0–0.2)

## 2020-03-20 LAB — BASIC METABOLIC PANEL
Anion gap: 12 (ref 5–15)
BUN: 12 mg/dL (ref 8–23)
CO2: 22 mmol/L (ref 22–32)
Calcium: 9.6 mg/dL (ref 8.9–10.3)
Chloride: 106 mmol/L (ref 98–111)
Creatinine, Ser: 0.77 mg/dL (ref 0.61–1.24)
GFR calc Af Amer: >60 mL/min (ref >60)
GFR calc non Af Amer: >60 mL/min (ref >60)
Glucose, Bld: 109 mg/dL — ABNORMAL HIGH (ref 70–99)
Potassium: 4 mmol/L (ref 3.5–5.1)
Sodium: 140 mmol/L (ref 135–145)

## 2020-03-20 LAB — SURGICAL PCR SCREEN
MRSA, PCR: NEGATIVE
Staphylococcus aureus: NEGATIVE

## 2020-03-20 LAB — TYPE AND SCREEN
ABO/RH(D): B POS
Antibody Screen: NEGATIVE

## 2020-03-20 NOTE — Progress Notes (Addendum)
PCP - [place]   Chest x-ray - 07/12/19              EKG - 01/02/20 Stress Test - 11/11 ECHO - 11/16 Cardiac Cath - 11/16  Sleep Study - na  Aspirin Instructions: stopped 03/10/20  Pt. To follow up with Dr. Christella Noa if he needs to stop lialda prior to surgery.  COVID TEST- 03/24/20   Anesthesia review: cardiac hx.  Patient denies shortness of breath, fever, cough and chest pain at PAT appointment   All instructions explained to the patient, with a verbal understanding of the material. Patient agrees to go over the instructions while at home for a better understanding. Patient also instructed to self quarantine after being tested for COVID-19. The opportunity to ask questions was provided.

## 2020-03-21 NOTE — Anesthesia Preprocedure Evaluation (Addendum)
Anesthesia Evaluation  Patient identified by MRN, date of birth, ID band Patient awake    Reviewed: Allergy & Precautions, H&P , NPO status , Patient's Chart, lab work & pertinent test results  Airway Mallampati: II  TM Distance: >3 FB Neck ROM: Full    Dental no notable dental hx. (+) Teeth Intact, Dental Advisory Given   Pulmonary neg pulmonary ROS,    Pulmonary exam normal breath sounds clear to auscultation       Cardiovascular Exercise Tolerance: Good + CAD and + Cardiac Stents   Rhythm:Regular Rate:Normal     Neuro/Psych negative neurological ROS  negative psych ROS   GI/Hepatic Neg liver ROS, PUD,   Endo/Other  negative endocrine ROS  Renal/GU negative Renal ROS  negative genitourinary   Musculoskeletal  (+) Arthritis , Osteoarthritis,    Abdominal   Peds  Hematology  (+) Blood dyscrasia, anemia ,   Anesthesia Other Findings   Reproductive/Obstetrics negative OB ROS                            Anesthesia Physical Anesthesia Plan  ASA: III  Anesthesia Plan: General   Post-op Pain Management:    Induction: Intravenous  PONV Risk Score and Plan: 3 and Ondansetron, Dexamethasone and Midazolam  Airway Management Planned: Oral ETT and Video Laryngoscope Planned  Additional Equipment:   Intra-op Plan:   Post-operative Plan: Extubation in OR  Informed Consent: I have reviewed the patients History and Physical, chart, labs and discussed the procedure including the risks, benefits and alternatives for the proposed anesthesia with the patient or authorized representative who has indicated his/her understanding and acceptance.     Dental advisory given  Plan Discussed with: CRNA  Anesthesia Plan Comments: (PAT note written 03/21/2020 by Myra Gianotti, PA-C. )       Anesthesia Quick Evaluation

## 2020-03-21 NOTE — Progress Notes (Signed)
Anesthesia Chart Review:  Case: [id number] Date/Time: 03/26/20 0845   Procedure: Cervical 4-5, Cervical 6-7 Anterior cervical decompression/discectomy/fusion (N/A ) - 3C   Anesthesia type: General   Pre-op diagnosis: Cervical spondylosis with myelopathy   Location: [facility] OR ROOM 18 / [place] OR   Surgeons: Ashok Pall, MD      DISCUSSION: Patient is a 64 year old male scheduled for the above procedure. He was having symptoms of dizziness, neck pain, and right arm numbness. Cervical MRI showed protrusion at C6-7 with cord flattening and moderate-severe foraminal narrowing, central protrusion at C5-6 with mild foraminal narrowing, and flattening of the ventral cord with severe foraminal narrowing at C4-5. Dr. Lorelei Pont referred him for urgent neurosurgery evaluation.   Other history includes never smoker, CAD (s/p DES RCA 05/16/07 [facility], [place]), pericarditis (10/2015, s/p colchicine), HLD, ulcerative colitis (on Lialda), prostate cancer (s/p radioactive seed implant 08/10/19), melanoma (nose), iron deficiency anemia, right THA (2014). Reported he "woke up" during during hip replacement, but no problems since. S/p left unicompartmental knee under spinal on 11/30/18.  He was doing well at 10/2019 cardiology visit with Dr. Harrington Challenger. He tolerated prostate surgery in August. Last ASA 03/10/20. He denied SOB and chest pain at PAT RN visit.  Presurgical COVID-19 test is scheduled for 03/24/2020.  Anesthesia team to evaluate on the day of surgery.   VS: BP (!) 142/83   Pulse 88   Temp 36.9 C (Oral)   Resp 18   Ht 5' 4"  (1.626 m)   Wt 86.8 kg   SpO2 98%   BMI 32.85 kg/m    PROVIDERS: [place], Gay Filler, MD is PCP  Dorris Carnes, MD is cardiologist. Last visit 11/12/19. Doing well from a CAD standpoint at that time, 10 month follow-up recommended. Tyler Pita, MD is RAD-ONC Alexis Frock, MD is urologist Clarene Essex, MD is GI    LABS: Labs reviewed: Acceptable for surgery. (all labs  ordered are listed, but only abnormal results are displayed)  Labs Reviewed  CBC - Abnormal; Notable for the following components:      Result Value   RBC 4.18 (*)    Hemoglobin 11.8 (*)    HCT 36.8 (*)    All other components within normal limits  BASIC METABOLIC PANEL - Abnormal; Notable for the following components:   Glucose, Bld 109 (*)    All other components within normal limits  SURGICAL PCR SCREEN  TYPE AND SCREEN     IMAGES: MRI C-spine 02/23/20: IMPRESSION: - Broad-based central protrusion at C6-7 flattens the cord. Moderate to moderately severe foraminal narrowing at this level is worse on the right. - Broad-based central protrusion at C5-6 deforms the cord, worse on the right. Mild bilateral foraminal narrowing is present at C5-6. - Flattening of the ventral cord and severe bilateral foraminal narrowing at C4-5 due to disc osteophyte complex and uncovertebral spurring.  CXR 07/12/19: FINDINGS: The heart size and mediastinal contours are within normal limits. Both lungs are clear. The visualized skeletal structures are unremarkable. IMPRESSION: No active cardiopulmonary disease.   EKG: 01/02/20: Sinus  Tachycardia  -Short PR syndrome  PRi = 104 BORDERLINE RHYTHM   CV: Echo 11/19/15: Study Conclusions  - Left ventricle: The cavity size was normal. Wall thickness was  normal. Systolic function was normal. The estimated ejection  fraction was in the range of 60% to 65%. Wall motion was normal;  there were no regional wall motion abnormalities. Doppler  parameters are consistent with abnormal left ventricular  relaxation (  grade 1 diastolic dysfunction).    Cardiac cath 11/18/15: LM: Normal LAD: 50% stenosis prox LAD best appreciated in the RAO caudal view.  LCX: OM1 and OM2 with minimal luminal irregularities. RCA: Prox to mid RCA 25% stenosed, lesion previously treated with a stent (unknown type) with mild diffuse in-stent restenosis. Mid to  dist RCA 30% stenosed, lesion previously treated with a stent (unknown type) with mild diffuse in-stent restenosis. 1. Patent stents in the RCA with mild in-stent restenosis 2. Moderate proximal LAD stenosis, do not suspect 'flow-limiting' lesion 3. Widely patent and large left circumflex without stenosis 4. Vigorous LV systolic function with normal LVEDP Recommend: suspect noncardiac chest pain. Pt with 7/10 chest pain on cath table with TIMI-3 flow and no high-grade coronary obstruction. Will refer urgently for CTA - PE study to evaluate for pulmonary embolus.   Past Medical History:  Diagnosis Date  . Arthritis    knees, hips, hands  . Complication of anesthesia    Woke up during hip replacement, had surgery since with no problems  . Coronary artery disease    a. s/p DES to RCA in 2008;  b. 10/2015 Cath: LM nl, LAD 50ost/p, OM1/2 min irregs, RCA 30mISR, 346m ISR, EF 65%.  . Echocardiogram    Echo 11/16: EF 60-65, normal wall motion, grade 1 diastolic dysfunction  . Grade I diastolic dysfunction 1101/60/1093 ECHO  . Hyperlipidemia   . Iron deficiency anemia due to chronic blood loss 09/10/2015  . Melanoma ([facility]   Nose  . Pericarditis    a. 10/2015->colchicine.  . Prostate cancer ([facility]  . Ulcerative colitis with complication ([facility]9/2/35/5732  Past Surgical History:  Procedure Laterality Date  . CARDIAC CATHETERIZATION N/A 11/18/2015   Procedure: Left Heart Cath and Coronary Angiography;  Surgeon: MiSherren MochaMD;  [facility];  Service: Cardiovascular;  Laterality: N/A;  . COLONOSCOPY  2019  . CORONARY ANGIOPLASTY WITH STENT PLACEMENT  2008   a. Cordis stent to RCA in 2008 2.7578m 81m20m HIP ARTHROPLASTY Right   . MOHS SURGERY    . PARTIAL KNEE ARTHROPLASTY Left 11/30/2018   Procedure: LEFT UNICOMPARTMENTAL KNEE Medially;  Surgeon: OlinParalee Cancel;  Location: [facility];  Service: Orthopedics;  Laterality: Left;  70 mins with block  . RADIOACTIVE  SEED IMPLANT N/A 08/10/2019   Procedure: RADIOACTIVE SEED IMPLANT/BRACHYTHERAPY IMPLANT;  Surgeon: MannAlexis Frock;  [facility];  Laterality: N/A;  . SPACE OAR INSTILLATION N/A 08/10/2019   Procedure: SPACE OAR INSTILLATION;  Surgeon: MannAlexis Frock;  [facility];  Laterality: N/A;  . TONSILLECTOMY     childhood    MEDICATIONS: . aspirin EC 81 MG tablet  . docusate sodium (COLACE) 100 MG capsule  . loratadine (CLARITIN) 10 MG tablet  . meclizine (ANTIVERT) 25 MG tablet  . mesalamine (LIALDA) 1.2 g EC tablet  . metoprolol tartrate (LOPRESSOR) 25 MG tablet  . Multiple Vitamins-Minerals (CENTRUM SILVER PO)  . nitroGLYCERIN (NITROSTAT) 0.4 MG SL tablet  . Omega-3 Fatty Acids (FISH OIL) 1200 MG CAPS  . OVER THE COUNTER MEDICATION  . rosuvastatin (CRESTOR) 20 MG tablet  . tamsulosin (FLOMAX) 0.4 MG CAPS capsule  . traMADol (ULTRAM) 50 MG tablet   No current facility-administered medications for this encounter.     AlliMyra Gianotti-C Surgical Short Stay/Anesthesiology [facility] ([phone] [facility] ([phone]/2021 12:51 PM

## 2020-03-24 ENCOUNTER — Other Ambulatory Visit (HOSPITAL_COMMUNITY)
Admission: RE | Admit: 2020-03-24 | Discharge: 2020-03-24 | Disposition: A | Discharge status: Home / Self Care | Payer: 59 | Source: Ambulatory Visit | Attending: Neurosurgery | Admitting: Neurosurgery

## 2020-03-24 DIAGNOSIS — Z20822 Contact with and (suspected) exposure to covid-19: Secondary | ICD-10-CM | POA: Insufficient documentation

## 2020-03-24 DIAGNOSIS — Z01812 Encounter for preprocedural laboratory examination: Secondary | ICD-10-CM | POA: Diagnosis present

## 2020-03-24 LAB — SARS CORONAVIRUS 2 (TAT 6-24 HRS): SARS Coronavirus 2: NEGATIVE

## 2020-03-25 ENCOUNTER — Encounter: Payer: Self-pay | Admitting: Family Medicine

## 2020-03-26 ENCOUNTER — Ambulatory Visit (HOSPITAL_COMMUNITY): Payer: 59 | Admitting: Physician Assistant

## 2020-03-26 ENCOUNTER — Observation Stay (HOSPITAL_COMMUNITY)
Admission: RE | Admit: 2020-03-26 | Discharge: 2020-03-27 | Disposition: A | Discharge status: Home / Self Care | Payer: 59 | Attending: Neurosurgery | Admitting: Neurosurgery

## 2020-03-26 ENCOUNTER — Ambulatory Visit (HOSPITAL_COMMUNITY): Payer: 59

## 2020-03-26 ENCOUNTER — Encounter (HOSPITAL_COMMUNITY)
Admission: RE | Disposition: A | Discharge status: Home / Self Care | Payer: Self-pay | Source: Home / Self Care | Attending: Neurosurgery

## 2020-03-26 ENCOUNTER — Other Ambulatory Visit: Payer: Self-pay

## 2020-03-26 DIAGNOSIS — K279 Peptic ulcer, site unspecified, unspecified as acute or chronic, without hemorrhage or perforation: Secondary | ICD-10-CM | POA: Diagnosis not present

## 2020-03-26 DIAGNOSIS — Z888 Allergy status to other drugs, medicaments and biological substances status: Secondary | ICD-10-CM | POA: Insufficient documentation

## 2020-03-26 DIAGNOSIS — I2510 Atherosclerotic heart disease of native coronary artery without angina pectoris: Secondary | ICD-10-CM | POA: Insufficient documentation

## 2020-03-26 DIAGNOSIS — Z419 Encounter for procedure for purposes other than remedying health state, unspecified: Secondary | ICD-10-CM

## 2020-03-26 DIAGNOSIS — G5601 Carpal tunnel syndrome, right upper limb: Principal | ICD-10-CM | POA: Insufficient documentation

## 2020-03-26 DIAGNOSIS — Z955 Presence of coronary angioplasty implant and graft: Secondary | ICD-10-CM | POA: Diagnosis not present

## 2020-03-26 DIAGNOSIS — D649 Anemia, unspecified: Secondary | ICD-10-CM | POA: Diagnosis not present

## 2020-03-26 DIAGNOSIS — M1990 Unspecified osteoarthritis, unspecified site: Secondary | ICD-10-CM | POA: Insufficient documentation

## 2020-03-26 DIAGNOSIS — Z7982 Long term (current) use of aspirin: Secondary | ICD-10-CM | POA: Insufficient documentation

## 2020-03-26 DIAGNOSIS — Z79899 Other long term (current) drug therapy: Secondary | ICD-10-CM | POA: Insufficient documentation

## 2020-03-26 DIAGNOSIS — M4722 Other spondylosis with radiculopathy, cervical region: Secondary | ICD-10-CM | POA: Insufficient documentation

## 2020-03-26 DIAGNOSIS — M4712 Other spondylosis with myelopathy, cervical region: Secondary | ICD-10-CM | POA: Diagnosis present

## 2020-03-26 HISTORY — PX: ANTERIOR CERVICAL DECOMP/DISCECTOMY FUSION: SHX1161

## 2020-03-26 HISTORY — PX: CARPAL TUNNEL RELEASE: SHX101

## 2020-03-26 SURGERY — ANTERIOR CERVICAL DECOMPRESSION/DISCECTOMY FUSION 3 LEVELS
Anesthesia: General | Site: Spine Cervical | Laterality: Right

## 2020-03-26 MED ORDER — PHENYLEPHRINE HCL (PRESSORS) 10 MG/ML IV SOLN
INTRAVENOUS | Status: DC | PRN
  Administered 2020-03-26 (×5): 80 ug via INTRAVENOUS

## 2020-03-26 MED ORDER — DOCUSATE SODIUM 100 MG PO CAPS
100.0000 mg | ORAL_CAPSULE | Freq: Every day | ORAL | Status: DC
  Administered 2020-03-26: 22:00:00 100 mg via ORAL
  Filled 2020-03-26: qty 1

## 2020-03-26 MED ORDER — ASPIRIN EC 81 MG PO TBEC: 81.0000 mg | DELAYED_RELEASE_TABLET | Freq: Every day | ORAL | Status: DC

## 2020-03-26 MED ORDER — NITROGLYCERIN 0.4 MG SL SUBL: 0.4000 mg | SUBLINGUAL_TABLET | SUBLINGUAL | Status: DC | PRN

## 2020-03-26 MED ORDER — MORPHINE SULFATE (PF) 2 MG/ML IV SOLN: 2.0000 mg | INTRAVENOUS | Status: DC | PRN

## 2020-03-26 MED ORDER — ROCURONIUM BROMIDE 50 MG/5ML IV SOSY
PREFILLED_SYRINGE | INTRAVENOUS | Status: DC | PRN
  Administered 2020-03-26: 50 mg via INTRAVENOUS
  Administered 2020-03-26: 20 mg via INTRAVENOUS
  Administered 2020-03-26: 30 mg via INTRAVENOUS

## 2020-03-26 MED ORDER — LORATADINE 10 MG PO TABS
10.0000 mg | ORAL_TABLET | Freq: Every day | ORAL | Status: DC
  Administered 2020-03-27: 09:00:00 10 mg via ORAL
  Filled 2020-03-26: qty 1

## 2020-03-26 MED ORDER — MIDAZOLAM HCL 5 MG/5ML IJ SOLN
INTRAMUSCULAR | Status: DC | PRN
  Administered 2020-03-26: 2 mg via INTRAVENOUS

## 2020-03-26 MED ORDER — TAMSULOSIN HCL 0.4 MG PO CAPS
0.4000 mg | ORAL_CAPSULE | Freq: Every day | ORAL | Status: DC
  Administered 2020-03-26: 0.4 mg via ORAL
  Filled 2020-03-26: qty 1

## 2020-03-26 MED ORDER — ONDANSETRON HCL 4 MG/2ML IJ SOLN: 4.0000 mg | Freq: Four times a day (QID) | INTRAMUSCULAR | Status: DC | PRN

## 2020-03-26 MED ORDER — PROPOFOL 10 MG/ML IV BOLUS
INTRAVENOUS | Status: DC | PRN
  Administered 2020-03-26: 30 mg via INTRAVENOUS
  Administered 2020-03-26: 100 mg via INTRAVENOUS

## 2020-03-26 MED ORDER — ACETAMINOPHEN 500 MG PO TABS
1000.0000 mg | ORAL_TABLET | Freq: Once | ORAL | Status: AC
  Administered 2020-03-26: 1000 mg via ORAL
  Filled 2020-03-26: qty 2

## 2020-03-26 MED ORDER — LIDOCAINE-EPINEPHRINE 0.5 %-1:200000 IJ SOLN
INTRAMUSCULAR | Status: DC | PRN
  Administered 2020-03-26: 7 mL
  Administered 2020-03-26: 3.5 mL

## 2020-03-26 MED ORDER — ONDANSETRON HCL 4 MG/2ML IJ SOLN
INTRAMUSCULAR | Status: DC | PRN
  Administered 2020-03-26: 4 mg via INTRAVENOUS

## 2020-03-26 MED ORDER — ONDANSETRON HCL 4 MG PO TABS: 4.0000 mg | ORAL_TABLET | Freq: Four times a day (QID) | ORAL | Status: DC | PRN

## 2020-03-26 MED ORDER — CHLORHEXIDINE GLUCONATE CLOTH 2 % EX PADS: 6.0000 | MEDICATED_PAD | Freq: Once | CUTANEOUS | Status: DC

## 2020-03-26 MED ORDER — BISACODYL 5 MG PO TBEC: 5.0000 mg | DELAYED_RELEASE_TABLET | Freq: Every day | ORAL | Status: DC | PRN

## 2020-03-26 MED ORDER — DEXAMETHASONE SODIUM PHOSPHATE 10 MG/ML IJ SOLN
INTRAMUSCULAR | Status: AC
  Filled 2020-03-26: qty 1

## 2020-03-26 MED ORDER — FENTANYL CITRATE (PF) 100 MCG/2ML IJ SOLN
INTRAMUSCULAR | Status: DC | PRN
  Administered 2020-03-26 (×4): 50 ug via INTRAVENOUS
  Administered 2020-03-26: 100 ug via INTRAVENOUS
  Administered 2020-03-26 (×3): 50 ug via INTRAVENOUS

## 2020-03-26 MED ORDER — MIDAZOLAM HCL 2 MG/2ML IJ SOLN
INTRAMUSCULAR | Status: AC
  Filled 2020-03-26: qty 2

## 2020-03-26 MED ORDER — HYDROMORPHONE HCL 1 MG/ML IJ SOLN
0.2500 mg | INTRAMUSCULAR | Status: DC | PRN
  Administered 2020-03-26: 14:00:00 0.5 mg via INTRAVENOUS

## 2020-03-26 MED ORDER — PHENOL 1.4 % MT LIQD: 1.0000 | OROMUCOSAL | Status: DC | PRN

## 2020-03-26 MED ORDER — OXYCODONE HCL 5 MG PO TABS: 5.0000 mg | ORAL_TABLET | ORAL | Status: DC | PRN

## 2020-03-26 MED ORDER — SODIUM CHLORIDE 0.9% FLUSH
3.0000 mL | Freq: Two times a day (BID) | INTRAVENOUS | Status: DC
  Administered 2020-03-26: 3 mL via INTRAVENOUS

## 2020-03-26 MED ORDER — FENTANYL CITRATE (PF) 250 MCG/5ML IJ SOLN
INTRAMUSCULAR | Status: AC
  Filled 2020-03-26: qty 5

## 2020-03-26 MED ORDER — THROMBIN 5000 UNITS EX SOLR
CUTANEOUS | Status: AC
  Filled 2020-03-26: qty 10000

## 2020-03-26 MED ORDER — THROMBIN 5000 UNITS EX SOLR
CUTANEOUS | Status: DC | PRN
  Administered 2020-03-26 (×2): 5000 [IU] via TOPICAL

## 2020-03-26 MED ORDER — SODIUM CHLORIDE 0.9 % IV SOLN: 250.0000 mL | INTRAVENOUS | Status: DC

## 2020-03-26 MED ORDER — MESALAMINE 1.2 G PO TBEC
1.2000 g | DELAYED_RELEASE_TABLET | Freq: Two times a day (BID) | ORAL | Status: DC
  Administered 2020-03-26 – 2020-03-27 (×2): 1.2 g via ORAL
  Filled 2020-03-26 (×3): qty 1

## 2020-03-26 MED ORDER — LIDOCAINE 2% (20 MG/ML) 5 ML SYRINGE
INTRAMUSCULAR | Status: AC
  Filled 2020-03-26: qty 5

## 2020-03-26 MED ORDER — ALBUMIN HUMAN 5 % IV SOLN: INTRAVENOUS | Status: DC | PRN

## 2020-03-26 MED ORDER — HYDROMORPHONE HCL 1 MG/ML IJ SOLN
INTRAMUSCULAR | Status: AC
  Administered 2020-03-26: 15:00:00 0.5 mg via INTRAVENOUS
  Filled 2020-03-26: qty 1

## 2020-03-26 MED ORDER — OXYCODONE HCL 5 MG PO TABS
ORAL_TABLET | ORAL | Status: AC
  Administered 2020-03-26: 15:00:00 5 mg via ORAL
  Filled 2020-03-26: qty 1

## 2020-03-26 MED ORDER — 0.9 % SODIUM CHLORIDE (POUR BTL) OPTIME
TOPICAL | Status: DC | PRN
  Administered 2020-03-26: 1000 mL

## 2020-03-26 MED ORDER — SODIUM CHLORIDE 0.9 % IV SOLN
INTRAVENOUS | Status: DC | PRN
  Administered 2020-03-26: 30 ug/min via INTRAVENOUS

## 2020-03-26 MED ORDER — LACTATED RINGERS IV SOLN: INTRAVENOUS | Status: DC | PRN

## 2020-03-26 MED ORDER — ROCURONIUM BROMIDE 10 MG/ML (PF) SYRINGE
PREFILLED_SYRINGE | INTRAVENOUS | Status: AC
  Filled 2020-03-26: qty 10

## 2020-03-26 MED ORDER — OXYCODONE HCL 5 MG PO TABS
10.0000 mg | ORAL_TABLET | ORAL | Status: DC | PRN
  Administered 2020-03-26 – 2020-03-27 (×5): 10 mg via ORAL
  Filled 2020-03-26 (×5): qty 2

## 2020-03-26 MED ORDER — ONDANSETRON HCL 4 MG/2ML IJ SOLN
INTRAMUSCULAR | Status: AC
  Filled 2020-03-26: qty 2

## 2020-03-26 MED ORDER — DIAZEPAM 5 MG PO TABS
ORAL_TABLET | ORAL | Status: AC
  Administered 2020-03-26: 15:00:00 5 mg via ORAL
  Filled 2020-03-26: qty 1

## 2020-03-26 MED ORDER — LIDOCAINE-EPINEPHRINE 0.5 %-1:200000 IJ SOLN
INTRAMUSCULAR | Status: AC
  Filled 2020-03-26: qty 1

## 2020-03-26 MED ORDER — PHENYLEPHRINE 40 MCG/ML (10ML) SYRINGE FOR IV PUSH (FOR BLOOD PRESSURE SUPPORT)
PREFILLED_SYRINGE | INTRAVENOUS | Status: AC
  Filled 2020-03-26: qty 10

## 2020-03-26 MED ORDER — THROMBIN 5000 UNITS EX SOLR
CUTANEOUS | Status: AC
  Filled 2020-03-26: qty 5000

## 2020-03-26 MED ORDER — MENTHOL 3 MG MT LOZG
1.0000 | LOZENGE | OROMUCOSAL | Status: DC | PRN
  Filled 2020-03-26: qty 9

## 2020-03-26 MED ORDER — SUGAMMADEX SODIUM 200 MG/2ML IV SOLN
INTRAVENOUS | Status: DC | PRN
  Administered 2020-03-26: 200 mg via INTRAVENOUS

## 2020-03-26 MED ORDER — THROMBIN 5000 UNITS EX SOLR: OROMUCOSAL | Status: DC | PRN

## 2020-03-26 MED ORDER — EPHEDRINE SULFATE 50 MG/ML IJ SOLN
INTRAMUSCULAR | Status: DC | PRN
  Administered 2020-03-26: 10 mg via INTRAVENOUS
  Administered 2020-03-26: 5 mg via INTRAVENOUS

## 2020-03-26 MED ORDER — ACETAMINOPHEN 500 MG PO TABS: 1000.0000 mg | ORAL_TABLET | Freq: Once | ORAL | Status: DC

## 2020-03-26 MED ORDER — CEFAZOLIN SODIUM-DEXTROSE 2-4 GM/100ML-% IV SOLN
2.0000 g | INTRAVENOUS | Status: AC
  Administered 2020-03-26: 2 g via INTRAVENOUS
  Filled 2020-03-26: qty 100

## 2020-03-26 MED ORDER — METOPROLOL TARTRATE 12.5 MG HALF TABLET
12.5000 mg | ORAL_TABLET | Freq: Every day | ORAL | Status: DC
  Administered 2020-03-27: 12.5 mg via ORAL
  Filled 2020-03-26: qty 1

## 2020-03-26 MED ORDER — CELECOXIB 200 MG PO CAPS
200.0000 mg | ORAL_CAPSULE | Freq: Two times a day (BID) | ORAL | Status: DC
  Administered 2020-03-26 – 2020-03-27 (×2): 200 mg via ORAL
  Filled 2020-03-26 (×2): qty 1

## 2020-03-26 MED ORDER — BACITRACIN ZINC 500 UNIT/GM EX OINT
TOPICAL_OINTMENT | CUTANEOUS | Status: DC | PRN
  Administered 2020-03-26: 1 via TOPICAL

## 2020-03-26 MED ORDER — PROPOFOL 10 MG/ML IV BOLUS
INTRAVENOUS | Status: AC
  Filled 2020-03-26: qty 40

## 2020-03-26 MED ORDER — SENNOSIDES-DOCUSATE SODIUM 8.6-50 MG PO TABS: 1.0000 | ORAL_TABLET | Freq: Every evening | ORAL | Status: DC | PRN

## 2020-03-26 MED ORDER — DEXAMETHASONE SODIUM PHOSPHATE 4 MG/ML IJ SOLN
INTRAMUSCULAR | Status: DC | PRN
  Administered 2020-03-26: 10 mg via INTRAVENOUS

## 2020-03-26 MED ORDER — OXYCODONE HCL ER 10 MG PO T12A
10.0000 mg | EXTENDED_RELEASE_TABLET | Freq: Two times a day (BID) | ORAL | Status: DC
  Administered 2020-03-26 – 2020-03-27 (×2): 10 mg via ORAL
  Filled 2020-03-26 (×2): qty 1

## 2020-03-26 MED ORDER — DIAZEPAM 5 MG PO TABS
5.0000 mg | ORAL_TABLET | Freq: Four times a day (QID) | ORAL | Status: DC | PRN
  Administered 2020-03-26 – 2020-03-27 (×2): 5 mg via ORAL
  Filled 2020-03-26 (×2): qty 1

## 2020-03-26 MED ORDER — LIDOCAINE 2% (20 MG/ML) 5 ML SYRINGE
INTRAMUSCULAR | Status: DC | PRN
  Administered 2020-03-26: 60 mg via INTRAVENOUS
  Administered 2020-03-26: 40 mg via INTRAVENOUS

## 2020-03-26 MED ORDER — HEMOSTATIC AGENTS (NO CHARGE) OPTIME
TOPICAL | Status: DC | PRN
  Administered 2020-03-26: 1 via TOPICAL

## 2020-03-26 MED ORDER — ACETAMINOPHEN 325 MG PO TABS
650.0000 mg | ORAL_TABLET | ORAL | Status: DC | PRN
  Administered 2020-03-26: 650 mg via ORAL
  Filled 2020-03-26: qty 2

## 2020-03-26 MED ORDER — POTASSIUM CHLORIDE IN NACL 20-0.9 MEQ/L-% IV SOLN: INTRAVENOUS | Status: DC

## 2020-03-26 MED ORDER — SODIUM CHLORIDE 0.9% FLUSH: 3.0000 mL | INTRAVENOUS | Status: DC | PRN

## 2020-03-26 MED ORDER — BACITRACIN ZINC 500 UNIT/GM EX OINT
TOPICAL_OINTMENT | CUTANEOUS | Status: AC
  Filled 2020-03-26: qty 28.35

## 2020-03-26 MED ORDER — ACETAMINOPHEN 650 MG RE SUPP: 650.0000 mg | RECTAL | Status: DC | PRN

## 2020-03-26 SURGICAL SUPPLY — 52 items
BLADE SURG 15 STRL LF DISP TIS (BLADE) ×3 IMPLANT
BLADE SURG 15 STRL SS (BLADE) ×2
BNDG ELASTIC 3X5.8 VLCR STR LF (GAUZE/BANDAGES/DRESSINGS) ×2 IMPLANT
BNDG GAUZE ELAST 4 BULKY (GAUZE/BANDAGES/DRESSINGS) ×2 IMPLANT
BUR DRUM 4.0 (BURR) ×4 IMPLANT
BUR DRUM 4.0MM (BURR) ×1
BUR MATCHSTICK NEURO 3.0 LAGG (BURR) ×5 IMPLANT
CABLE BIPOLOR RESECTION CORD (MISCELLANEOUS) ×5 IMPLANT
CARTRIDGE OIL MAESTRO DRILL (MISCELLANEOUS) ×3 IMPLANT
DERMABOND ADVANCED (GAUZE/BANDAGES/DRESSINGS) ×2
DERMABOND ADVANCED .7 DNX12 (GAUZE/BANDAGES/DRESSINGS) ×3 IMPLANT
DIFFUSER DRILL AIR PNEUMATIC (MISCELLANEOUS) ×5 IMPLANT
DRAPE EXTREMITY T 121X128X90 (DISPOSABLE) ×5 IMPLANT
DRAPE HALF SHEET 40X57 (DRAPES) ×7 IMPLANT
DRAPE LAPAROTOMY 100X72 PEDS (DRAPES) ×5 IMPLANT
DRAPE MICROSCOPE LEICA (MISCELLANEOUS) ×5 IMPLANT
DRSG EMULSION OIL 3X3 NADH (GAUZE/BANDAGES/DRESSINGS) ×2 IMPLANT
DURAPREP 26ML APPLICATOR (WOUND CARE) ×5 IMPLANT
DURAPREP 6ML APPLICATOR 50/CS (WOUND CARE) ×5 IMPLANT
GLOVE ECLIPSE 6.5 STRL STRAW (GLOVE) ×9 IMPLANT
GOWN STRL REUS W/ TWL LRG LVL3 (GOWN DISPOSABLE) ×6 IMPLANT
GOWN STRL REUS W/ TWL XL LVL3 (GOWN DISPOSABLE) IMPLANT
GOWN STRL REUS W/TWL 2XL LVL3 (GOWN DISPOSABLE) IMPLANT
GOWN STRL REUS W/TWL LRG LVL3 (GOWN DISPOSABLE) ×8
GOWN STRL REUS W/TWL XL LVL3 (GOWN DISPOSABLE) ×4
HEMOSTAT POWDER KIT SURGIFOAM (HEMOSTASIS) ×2 IMPLANT
KIT BASIN OR (CUSTOM PROCEDURE TRAY) ×5 IMPLANT
KIT TURNOVER KIT B (KITS) ×5 IMPLANT
NDL HYPO 25X1 1.5 SAFETY (NEEDLE) ×3 IMPLANT
NDL SPNL 22GX3.5 QUINCKE BK (NEEDLE) ×3 IMPLANT
NEEDLE HYPO 25X1 1.5 SAFETY (NEEDLE) ×5 IMPLANT
NEEDLE SPNL 22GX3.5 QUINCKE BK (NEEDLE) ×5 IMPLANT
NS IRRIG 1000ML POUR BTL (IV SOLUTION) ×5 IMPLANT
OIL CARTRIDGE MAESTRO DRILL (MISCELLANEOUS) ×5
PACK LAMINECTOMY NEURO (CUSTOM PROCEDURE TRAY) ×5 IMPLANT
PIN DISTRACTION 14MM (PIN) ×2 IMPLANT
PLATE VECTRA 45MM (Plate) ×2 IMPLANT
RUBBERBAND STERILE (MISCELLANEOUS) ×10 IMPLANT
SCREW 4.0X16MM (Screw) ×10 IMPLANT
SCREW SELF TAPPING 16MM (Screw) ×2 IMPLANT
SPACER ACF PARALLEL 7MM (Bone Implant) ×4 IMPLANT
SPACER CC-ACF 8MM PARALLEL (Bone Implant) ×2 IMPLANT
SPONGE INTESTINAL PEANUT (DISPOSABLE) ×5 IMPLANT
SPONGE SURGIFOAM ABS GEL SZ50 (HEMOSTASIS) ×5 IMPLANT
STOCKINETTE 4X48 STRL (DRAPES) ×5 IMPLANT
SUT ETHILON 3 0 PS 1 (SUTURE) ×5 IMPLANT
SUT VIC AB 3-0 SH 8-18 (SUTURE) ×5 IMPLANT
TOWEL GREEN STERILE (TOWEL DISPOSABLE) ×5 IMPLANT
TOWEL GREEN STERILE FF (TOWEL DISPOSABLE) ×5 IMPLANT
TUBE CONNECTING 12'X1/4 (SUCTIONS) ×1
TUBE CONNECTING 12X1/4 (SUCTIONS) ×4 IMPLANT
WATER STERILE IRR 1000ML POUR (IV SOLUTION) ×5 IMPLANT

## 2020-03-26 NOTE — H&P (Signed)
BP (!) 142/77   Pulse 91   Temp 98.7 F (37.1 C) (Oral)   Resp 18   Ht 5' 4"  (1.626 m)   Wt 86.2 kg   SpO2 98%   BMI 32.61 kg/m  significant amount of numbness and tingling in his hands.  He finds it very difficult to sleep, as the pain in the shoulders causes him to wake up repeatedly.  His wife uses the word miserable to describe him.  She says, "We eat and then we go to bed because he can't even keep his head up."  He says it feels as if his head is heavy at times.  He works as an Glass blower/designer.  He has a great deal of numbness in his hands bilaterally.  He does wake up shaking his hands on occasion.  He says this has been occurring since approximately Christmas.  He has a lot of pain at the base of his neck and also on turning his head he hears a lot of cracking.  He has had no weakness, but does feel less energetic at the beginning of the day than at the end.  No bowel or bladder dysfunction that is new.  He does have ulcerative colitis, I believe.  He has no balance issues.  He does have some lightheadedness when he bends or stands up quickly.  They were curious as to whether or not the cervical problems were causing this.     PHYSICAL EXAMINATION :  He is alert and oriented by 4.  He answers all questions appropriately.  Brisk reflexes at the knees and ankles; 2+ at the biceps, triceps, and brachioradialis.  Positive Hoffmann sign.  No clonus.  Proprioception is intact.  Fine motor movement is excellent.  Coordination is also normal.  Romberg is negative.  He can toe walk and heel walk, and tandem walk is done with ease.  Muscle tone, bulk, and coordination are otherwise good.  Pupils equal, round, and react to light.  Symmetric facial movements.  Hearing intact to voice.  Uvula intact to voice.     IMAGING :  MRI is reviewed and shows fairly significant spondylosis at 4-5 and 6-7, some at 5-6.  There is some mild foraminal narrowing, but the cord is not compressed.  The cord is  narrowed, especially on the left side at 4-5 and bilaterally at 6-7, which is worse.  There is also a large central osteophyte present.  He has no change in the cord signal.     VITAL SIGNS :  64 inches, weighs 196 pounds.  Blood pressure is 126/84, pulse is 80, temperature is 96.9, respiratory rate is 18.  Pain is 8/10.     RECOMMENDATION :  I have recommended, considering the amount of pain that he is in and the findings on the cervical spine MRI, that the best treatment course is operative decompression of the spinal canal and nerve roots to be an anterior cervical decompression and arthrodesis.  My initial thought is that he needs 4-5 and 6-7 done.  I am thinking repeatedly in my head as to whether or not I should include 5-6, but for right now I would just plan on doing the 2 levels and will take a closer look later. Prior to that though, I do want him to get an EMG nerve conduction study.  He did have a positive Tinel sign over the right carpal tunnel, less so on the left side.  No Tinel over the cubital  tunnel on either side.  The fact that he mentions numbness when driving when his hands are up or when he is on a phone makes me think I am going to find something on EMG, but I definitely want this done before I take him for his cervical spine.  I gave him a detailed instruction sheet with regard to this.    Tyler Estrada returns today.  He had an EMG done to check for possible carpal tunnel.  He has severe carpal tunnel in his right hand.  As I explained to him, I do believe we should relieve the carpal tunnel syndrome also, when we do his neck this coming Wednesday.  He is in agreement with that.  We will do the carpal tunnel 1st and then do the cervical procedure.  He also had fairly bad carpal tunnel on the left side, but his biggest problem is on the right, so there is no reason to do anything with that at the moment.

## 2020-03-26 NOTE — Anesthesia Procedure Notes (Signed)
Procedure Name: Intubation Date/Time: 03/26/2020 9:38 AM Performed by: Laretta Alstrom, CRNA Pre-anesthesia Checklist: Patient identified, Emergency Drugs available, Suction available and Patient being monitored Patient Re-evaluated:Patient Re-evaluated prior to induction Oxygen Delivery Method: Circle System Utilized Preoxygenation: Pre-oxygenation with 100% oxygen Induction Type: IV induction Ventilation: Mask ventilation without difficulty Laryngoscope Size: Glidescope and 4 Grade View: Grade I Tube type: Oral Tube size: 7.5 mm Number of attempts: 1 Airway Equipment and Method: Stylet and Oral airway Placement Confirmation: ETT inserted through vocal cords under direct vision,  positive ETCO2 and breath sounds checked- equal and bilateral Secured at: 21 cm Tube secured with: Tape Dental Injury: Teeth and Oropharynx as per pre-operative assessment  Comments: [place] performed

## 2020-03-26 NOTE — Op Note (Signed)
03/26/2020  2:12 PM  PATIENT:  Tyler Estrada  64 y.o. male  PRE-OPERATIVE DIAGNOSIS:  Cervical spondylosis with myelopathy C4-7 , right carpal tunnel syndrome POST-OPERATIVE DIAGNOSIS:  Cervical spondylosis with myelopathy C4-7 Right carpal tunnel syndrome PROCEDURE:  Anterior Cervical decompression C4-7 Arthrodesis C4-7 with 7,8, and 49m structural allografts Anterior instrumentation(Synthes Vectra) C4-7 Right carpal tunnel release SURGEON:   Surgeon(s): CAshok Pall MD   ASSISTANTS:none  ANESTHESIA:   general  EBL:  Total I/O In: 1700 [I.V.:1200; IV Piggyback:500] Out: 700 [Blood:700]  BLOOD ADMINISTERED:none  CELL SAVER GIVEN:none  COUNT:per nursing  DRAINS: none   SPECIMEN:  No Specimen  DICTATION: Mr. MRouchwas taken to the operating room, intubated, and placed under general anesthesia without difficulty. He had his right upper extremity prepped and draped in a sterile manner. I infiltrated Licodcaine  15/9%15/638,756strength epinephrine into the planned incision starting at the proximal palmar crease extending into the hand ~ 1.5cm. I opened the skin with a 15 blade and extended the incision through the skin into the subcutaneous tissue. I used the bipolar cautery to control the subcutaneous bleeding. I dissected sharply through the tissue using forceps also to expose the transverse carpal ligament. I divided the transverse carpal ligament sharply with the 15 blade using the forceps to protect the contents of the carpal tunnel. With the scissors I divided the ligament both proximally and distally to decompress the entire carpal tunnel. I used the scissors to dissect into the forearm to create space to divide the ligament to the proximal palmar crease, and distally into the palm.  I irrigated the wound then closed the incision with vertical interrupted vertical mattress sutures. I placed a sterile dressing, then wrapped the proximal hand and distal forearm with an ace  wrap.   Once the carpal tunnel procedure was completed we took down the drapes and   he was positioned supine with his head in slight extension on a horseshoe headrest. The neck was prepped and draped in a sterile manner. I infiltrated 3.5 cc's 1/2%lidocaine/1:200,000 strength epinephrine into the planned incision starting from the midline to the medial border of the left sternocleidomastoid muscle. I opened the incision with a 10 blade and dissected sharply through soft tissue to the platysma. I dissected in the plane superior to the platysma both rostrally and caudally. I then opened the platysma in a horizontal fashion with Metzenbaum scissors, and dissected in the inferior plane rostrally and caudally. With both blunt and sharp technique I created an avascular corridor to the cervical spine. I placed a spinal needle(s) in the disc space at 2/3 and 3/4 . I then reflected the longus colli from C4 to C7 and placed self retaining retractors. I opened the disc space(s) at 4/5,5/6,and 6/7 with a 15 blade. I removed disc with curettes, Kerrison punches, and the drill. Using the drill I removed osteophytes and prepared for the decompression.  I decompressed the spinal canal and the C5,6,and 7 root(s) with the drill, Kerrison punches, and the curettes. I used the microscope to aid in microdissection. I removed the posterior longitudinal ligament to fully expose and decompress the thecal sac at each level. I exposed the roots laterally taking down the 4/5,5/6,and 6/7 uncovertebral joints. With the decompression complete I moved on to the arthrodesis. I used the drill to level the surfaces of C4,5,6,and 7. I removed soft tissue to prepare the disc space and the bony surfaces. I measured the spaces and placed structural allografts into the disc spaces.  I then placed the anterior instrumentation. I placed 2 screws in C4,one screw into 6 and two screws into the C7 vertebral bodies  through the plate. I locked the  screws into place. Intraoperative xray showed the graft, plate, and screws to be in good position. I irrigated the wound, achieved hemostasis, and closed the wound in layers. I approximated the platysma, and the subcuticular plane with vicryl sutures. I used Dermabond for a sterile dressing.   PLAN OF CARE: Admit for overnight observation  PATIENT DISPOSITION:  PACU - hemodynamically stable.   Delay start of Pharmacological VTE agent (>24hrs) due to surgical blood loss or risk of bleeding:  yes

## 2020-03-26 NOTE — Transfer of Care (Signed)
Immediate Anesthesia Transfer of Care Note  Patient: Tyler Estrada  Procedure(s) Performed: Cervical Four-Five, Cervical Five-Six, Cervical Six-Seven Anterior Cervical Decompression/Discectomy/Fusion (N/A Spine Cervical) CARPAL TUNNEL RELEASE (Right )  Patient Location: PACU  Anesthesia Type:General  Level of Consciousness: alert   Airway & Oxygen Therapy: Patient Spontanous Breathing and Patient connected to face mask oxygen  Post-op Assessment: Report given to RN and Post -op Vital signs reviewed and stable  Post vital signs: Reviewed  Last Vitals:  Vitals Value Taken Time  BP 161/82 03/26/20 1404  Temp    Pulse 124 03/26/20 1406  Resp 14 03/26/20 1406  SpO2 98 % 03/26/20 1406  Vitals shown include unvalidated device data.  Last Pain:  Vitals:   03/26/20 0809  TempSrc:   PainSc: 8       Patients Stated Pain Goal: 3 (29/57/47 3403)  Complications: No apparent anesthesia complications

## 2020-03-27 ENCOUNTER — Encounter: Payer: Self-pay | Admitting: Family Medicine

## 2020-03-27 DIAGNOSIS — G5601 Carpal tunnel syndrome, right upper limb: Secondary | ICD-10-CM | POA: Diagnosis not present

## 2020-03-27 MED ORDER — TIZANIDINE HCL 4 MG PO TABS: 4.0000 mg | ORAL_TABLET | Freq: Four times a day (QID) | ORAL | 0 refills | Status: DC | PRN

## 2020-03-27 MED ORDER — OXYCODONE HCL 5 MG PO TABS: 5.0000 mg | ORAL_TABLET | Freq: Four times a day (QID) | ORAL | 0 refills | Status: AC | PRN

## 2020-03-27 NOTE — Anesthesia Postprocedure Evaluation (Signed)
Anesthesia Post Note  Patient: Tyler Estrada  Procedure(s) Performed: Cervical Four-Five, Cervical Five-Six, Cervical Six-Seven Anterior Cervical Decompression/Discectomy/Fusion (N/A Spine Cervical) CARPAL TUNNEL RELEASE (Right )     Patient location during evaluation: Other Anesthesia Type: General Level of consciousness: awake and alert Pain management: pain level controlled Vital Signs Assessment: post-procedure vital signs reviewed and stable Respiratory status: spontaneous breathing, nonlabored ventilation and respiratory function stable Cardiovascular status: blood pressure returned to baseline and stable Postop Assessment: no apparent nausea or vomiting Anesthetic complications: no    Last Vitals:  Vitals:   03/27/20 0426 03/27/20 0812  BP: 139/77 102/70  Pulse: 96 95  Resp: 18 19  Temp: 37.1 C 36.7 C  SpO2: 100% 100%    Last Pain:  Vitals:   03/27/20 0812  TempSrc: Oral  PainSc:                  Grettell Ransdell,W. EDMOND

## 2020-03-27 NOTE — Progress Notes (Signed)
Occupational Therapy Evaluation Patient Details Name: Tyler Estrada MRN: [id number] DOB: [birth year] Today's Date: 03/27/2020    History of Present Illness Pt is a 64 y/o male s/p ACDF C4-C7 and R carpal tunnel release. PMH including but not limited to UC.   Clinical Impression   Educated pt on cervical precautions and compensatory strategies for ADL tasks. Written handout provided. Pt verbalized understanding. Educated pt on edema management R hand and importance of frequently moving fingers/ wrist as tolerated. Pt verbalized understanding. Pt with R hand weakness. Pt states his shoulder pain has imporved significantly since surgery.     Follow Up Recommendations  Follow surgeon's recommendation for DC plan and follow-up therapies    Equipment Recommendations  None recommended by OT    Recommendations for Other Services       Precautions / Restrictions Precautions Precautions: Cervical Precaution Booklet Issued: Yes (comment) Precaution Comments: reviewed precautions with pt throughout Restrictions Weight Bearing Restrictions: No      Mobility Bed Mobility Overal bed mobility: Modified Independent             General bed mobility comments: OOB in chair. Reviewed proper technique  Transfers Overall transfer level: Modified independent Equipment used: None                  Balance Overall balance assessment: Needs assistance Sitting-balance support: Feet supported Sitting balance-Leahy Scale: Good     Standing balance support: During functional activity;No upper extremity supported Standing balance-Leahy Scale: Good                             ADL either performed or assessed with clinical judgement   ADL Overall ADL's : Needs assistance/impaired                                     Functional mobility during ADLs: Modified independent General ADL Comments: Able to use figure four position to complete LB ADL. Educated on  cervical precautions and compensatory strategies for ADL; Pt has DME and AE at home to use if needed. Pt ablet o use R hadn funcitonally at assist with ADL. Educated to not forcefully push or lift anything with R hand.      Vision         Perception     Praxis      Pertinent Vitals/Pain Pain Assessment: 0-10 Pain Score: 4  Pain Location: neck(R wrist incision) Pain Descriptors / Indicators: Guarding Pain Intervention(s): Limited activity within patient's tolerance;Ice applied;Repositioned     Hand Dominance Right   Extremity/Trunk Assessment Upper Extremity Assessment Upper Extremity Assessment: RUE deficits/detail RUE Deficits / Details: s/p CT release. R grip weaker than L; edematous fingers RUE Coordination: decreased fine motor   Lower Extremity Assessment Lower Extremity Assessment: Defer to PT evaluation   Cervical / Trunk Assessment Cervical / Trunk Assessment: Other exceptions Cervical / Trunk Exceptions: s/p cervical sx   Communication Communication Communication: No difficulties   Cognition Arousal/Alertness: Awake/alert Behavior During Therapy: WFL for tasks assessed/performed Overall Cognitive Status: Within Functional Limits for tasks assessed                                     General Comments       Exercises Exercises: Other exercises Other Exercises Other Exercises: Educated  on edema management for R Ue Other Exercises: encouraged pt to frequently move digits   Shoulder Instructions      Home Living Family/patient expects to be discharged to:: Private residence Living Arrangements: Spouse/significant other;Other relatives Available Help at Discharge: Family;Available 24 hours/day Type of Home: House Home Access: Level entry     Home Layout: One level     Bathroom Shower/Tub: Walk-in shower;Tub/shower unit   Bathroom Toilet: Handicapped height Bathroom Accessibility: Yes How Accessible: Accessible via walker Home  Equipment: [place] - 2 wheels;Shower seat;Hand held shower head;Adaptive equipment Adaptive Equipment: Reacher;Long-handled sponge Additional Comments: hand held shower head in bathtub/shower which he does not use      Prior Functioning/Environment Level of Independence: Independent                 OT Problem List: Decreased strength;Decreased range of motion;Decreased coordination;Decreased knowledge of use of DME or AE;Decreased knowledge of precautions;Obesity;Pain;Impaired UE functional use      OT Treatment/Interventions: Self-care/ADL training;Therapeutic exercise;Neuromuscular education;DME and/or AE instruction;Therapeutic activities;Patient/family education    OT Goals(Current goals can be found in the care plan section) Acute Rehab OT Goals Patient Stated Goal: decrease pain OT Goal Formulation: With patient Time For Goal Achievement: 04/03/20 Potential to Achieve Goals: Good  OT Frequency: Min 2X/week   Barriers to D/C:            Co-evaluation              AM-PAC OT "6 Clicks" Daily Activity     Outcome Measure Help from another person eating meals?: None Help from another person taking care of personal grooming?: A Little Help from another person toileting, which includes using toliet, bedpan, or urinal?: None Help from another person bathing (including washing, rinsing, drying)?: A Little Help from another person to put on and taking off regular upper body clothing?: A Little Help from another person to put on and taking off regular lower body clothing?: A Little 6 Click Score: 20   End of Session Nurse Communication: Mobility status  Activity Tolerance: Patient tolerated treatment well Patient left: in chair;with call bell/phone within reach  OT Visit Diagnosis: Pain;Muscle weakness (generalized) (M62.81) Pain - Right/Left: Right Pain - part of body: Hand(neck)                Time: 1000-1029 OT Time Calculation (min): 29 min Charges:  OT  General Charges $OT Visit: 1 Visit OT Evaluation $OT Eval Low Complexity: 1 Low OT Treatments $Self Care/Home Management : 8-22 mins  Maurie Boettcher, OT/L   Acute OT Clinical Specialist Acute Rehabilitation Services Pager [phone] Office [phone]   [facility] 03/27/2020, 1:20 PM

## 2020-03-27 NOTE — Progress Notes (Signed)
Pt doing well. Pt and sister given D/C instructions with verbal understanding. Rx's were sent to pharmacy per MD order. Pt's incisions are clean and dry with no sign of infection. Pt's IV was removed prior to D/C. Pt D/C'd home via wheelchair per MD order. Holli Humbles, RN

## 2020-03-27 NOTE — Discharge Summary (Signed)
Physician Discharge Summary  Patient ID: Tyler Estrada MRN: [id number] DOB/AGE: Jun 19, 1956 64 y.o.  Admit date: 03/26/2020 Discharge date: 03/27/2020  Admission Diagnoses:Cervical spondylosis with myelopathy, right carpal tunnel syndrome  Discharge Diagnoses:  Active Problems:   Cervical spondylosis with myelopathy and radiculopathy   Discharged Condition: good  Hospital Course: Mr. Tyler Estrada was admitted and taken to the operating room for an uncomplicated T2-6 decompression and arthrodesis with synthes vectra. He also underwent a right carpal tunnel release. Post op he is doing well. His speaking voice is strong. Moving all extremities well Wounds are clean,dry, without signs of infection.   Treatments: surgery: Anterior Cervical decompression C4-7 Arthrodesis C4-7 with 7,8, and 19m structural allografts Anterior instrumentation(Synthes Vectra) C4-7 Right carpal tunnel release  Discharge Exam: Blood pressure 113/72, pulse 90, temperature 98.2 F (36.8 C), temperature source Oral, resp. rate 18, height 5' 4"  (1.626 m), weight 86.2 kg, SpO2 100 %. General appearance: alert, cooperative, appears stated age and no distress  Disposition: Discharge disposition: 01-Home or Self Care      Cervical spondylosis with myelopathy  Allergies as of 03/27/2020      Reactions   Prednisone    Blurred vision       Medication List    STOP taking these medications   traMADol 50 MG tablet Commonly known as: Ultram     TAKE these medications   aspirin EC 81 MG tablet Take 81 mg by mouth at bedtime.   CENTRUM SILVER PO Take 1 tablet by mouth daily.   docusate sodium 100 MG capsule Commonly known as: COLACE Take 100 mg by mouth at bedtime.   Fish Oil 1200 MG Caps Take 1,200 mg by mouth at bedtime.   loratadine 10 MG tablet Commonly known as: CLARITIN Take 10 mg by mouth daily.   meclizine 25 MG tablet Commonly known as: ANTIVERT Take 0.5-1 tablets (12.5-25 mg total) by mouth 3  (three) times daily as needed for dizziness.   mesalamine 1.2 g EC tablet Commonly known as: LIALDA Take 1.2 g by mouth in the morning and at bedtime.   metoprolol tartrate 25 MG tablet Commonly known as: LOPRESSOR TAKE 1/2 TABLET BY MOUTH EVERY DAY   nitroGLYCERIN 0.4 MG SL tablet Commonly known as: NITROSTAT Place 1 tablet (0.4 mg total) under the tongue every 5 (five) minutes as needed for chest pain.   OVER THE COUNTER MEDICATION Take 1-2 capsules by mouth at bedtime as needed (pain). otc Hemp oil capsules   oxyCODONE 5 MG immediate release tablet Commonly known as: Oxy IR/ROXICODONE Take 1 tablet (5 mg total) by mouth every 6 (six) hours as needed for up to 8 days for moderate pain ((score 4 to 6)).   rosuvastatin 20 MG tablet Commonly known as: CRESTOR Take 1 tablet (20 mg total) by mouth daily.   tamsulosin 0.4 MG Caps capsule Commonly known as: FLOMAX Take 0.4 mg by mouth at bedtime.   tiZANidine 4 MG tablet Commonly known as: ZANAFLEX Take 1 tablet (4 mg total) by mouth every 6 (six) hours as needed for muscle spasms.      Follow-up Information    CAshok Pall MD Follow up in 1 week(s).   Specialty: Neurosurgery Why: please call to make an appointment for suture removal. Contact information: [address] [phone]          Signed: KAshok Pall4/07/2020, 1:11 PM

## 2020-03-27 NOTE — Evaluation (Signed)
Physical Therapy Evaluation & Discharge Patient Details Name: Tyler Estrada MRN: [id number] DOB: [birth year] Today's Date: 03/27/2020   History of Present Illness  Pt is a 64 y/o male s/p ACDF C4-C7 and R carpal tunnel release. PMH including but not limited to UC.  Clinical Impression  Pt presented supine in bed with HOB elevated, awake and willing to participate in therapy session. Prior to admission, pt reported that he was independent with all functional mobility and ADLs. Pt lives with his spouse and sister in a single level house with a level entry. At the time of evaluation, pt overall moving very well. He was at mod I to supervision level with all functional mobility, including hallway ambulation. Pt reported that he feels he is walking much better than before his surgery. PT provided pt education re: cervical precautions, car transfers with demonstration and a generalized walking program for pt to initiate upon d/c home. No further acute PT needs identified at this time. PT signing off.     Follow Up Recommendations No PT follow up    Equipment Recommendations  None recommended by PT    Recommendations for Other Services       Precautions / Restrictions Precautions Precautions: Cervical Precaution Booklet Issued: Yes (comment) Precaution Comments: reviewed precautions with pt throughout Restrictions Weight Bearing Restrictions: No      Mobility  Bed Mobility Overal bed mobility: Modified Independent             General bed mobility comments: cueing for log roll technique  Transfers Overall transfer level: Modified independent Equipment used: None                Ambulation/Gait Ambulation/Gait assistance: Supervision Gait Distance (Feet): 500 Feet Assistive device: None Gait Pattern/deviations: Step-through pattern;Decreased stride length Gait velocity: decreased   General Gait Details: pt overall steady with hallway ambulation without use of an AD; no  LOB or need for physical assistance  Stairs            Wheelchair Mobility    Modified Rankin (Stroke Patients Only)       Balance Overall balance assessment: Needs assistance Sitting-balance support: Feet supported Sitting balance-Leahy Scale: Good     Standing balance support: During functional activity;No upper extremity supported Standing balance-Leahy Scale: Fair                               Pertinent Vitals/Pain Pain Assessment: 0-10 Pain Score: 4  Pain Location: neck Pain Descriptors / Indicators: Guarding Pain Intervention(s): Monitored during session;Repositioned    Home Living Family/patient expects to be discharged to:: Private residence Living Arrangements: Spouse/significant other;Other relatives Available Help at Discharge: Family;Available 24 hours/day Type of Home: House Home Access: Level entry     Home Layout: One level Home Equipment: Walker - 2 wheels;Shower seat;Hand held shower head Additional Comments: hand held shower head in bathtub/shower which he does not use    Prior Function Level of Independence: Independent               Hand Dominance   Dominant Hand: Right    Extremity/Trunk Assessment   Upper Extremity Assessment Upper Extremity Assessment: Defer to OT evaluation    Lower Extremity Assessment Lower Extremity Assessment: Overall WFL for tasks assessed    Cervical / Trunk Assessment Cervical / Trunk Assessment: Other exceptions Cervical / Trunk Exceptions: s/p cervical sx  Communication   Communication: No difficulties  Cognition Arousal/Alertness: Awake/alert  Behavior During Therapy: WFL for tasks assessed/performed Overall Cognitive Status: Within Functional Limits for tasks assessed                                        General Comments      Exercises     Assessment/Plan    PT Assessment Patent does not need any further PT services  PT Problem List         PT  Treatment Interventions      PT Goals (Current goals can be found in the Care Plan section)  Acute Rehab PT Goals Patient Stated Goal: decrease pain PT Goal Formulation: All assessment and education complete, DC therapy    Frequency     Barriers to discharge        Co-evaluation               AM-PAC PT "6 Clicks" Mobility  Outcome Measure Help needed turning from your back to your side while in a flat bed without using bedrails?: None Help needed moving from lying on your back to sitting on the side of a flat bed without using bedrails?: None Help needed moving to and from a bed to a chair (including a wheelchair)?: None Help needed standing up from a chair using your arms (e.g., wheelchair or bedside chair)?: None Help needed to walk in hospital room?: None Help needed climbing 3-5 steps with a railing? : A Little 6 Click Score: 23    End of Session Equipment Utilized During Treatment: Gait belt Activity Tolerance: Patient tolerated treatment well Patient left: in chair;with call bell/phone within reach Nurse Communication: Mobility status PT Visit Diagnosis: Other abnormalities of gait and mobility (R26.89);Pain Pain - part of body: (neck)    Time: 3754-3606 PT Time Calculation (min) (ACUTE ONLY): 15 min   Charges:   PT Evaluation $PT Eval Low Complexity: 1 Low          Eduard Clos, PT, DPT  Acute Rehabilitation Services Pager [phone] Office [place] 03/27/2020, 10:11 AM

## 2020-03-28 ENCOUNTER — Emergency Department (HOSPITAL_BASED_OUTPATIENT_CLINIC_OR_DEPARTMENT_OTHER)
Admission: EM | Admit: 2020-03-28 | Discharge: 2020-03-28 | Disposition: A | Discharge status: Home / Self Care | Payer: 59 | Attending: Family Medicine | Admitting: Family Medicine

## 2020-03-28 ENCOUNTER — Other Ambulatory Visit: Payer: Self-pay

## 2020-03-28 ENCOUNTER — Encounter (HOSPITAL_BASED_OUTPATIENT_CLINIC_OR_DEPARTMENT_OTHER): Payer: Self-pay

## 2020-03-28 DIAGNOSIS — E785 Hyperlipidemia, unspecified: Secondary | ICD-10-CM | POA: Diagnosis not present

## 2020-03-28 DIAGNOSIS — Z888 Allergy status to other drugs, medicaments and biological substances status: Secondary | ICD-10-CM | POA: Diagnosis not present

## 2020-03-28 DIAGNOSIS — Z7982 Long term (current) use of aspirin: Secondary | ICD-10-CM | POA: Insufficient documentation

## 2020-03-28 DIAGNOSIS — I2510 Atherosclerotic heart disease of native coronary artery without angina pectoris: Secondary | ICD-10-CM | POA: Insufficient documentation

## 2020-03-28 DIAGNOSIS — R221 Localized swelling, mass and lump, neck: Secondary | ICD-10-CM | POA: Insufficient documentation

## 2020-03-28 DIAGNOSIS — Z79899 Other long term (current) drug therapy: Secondary | ICD-10-CM | POA: Insufficient documentation

## 2020-03-28 DIAGNOSIS — Z9861 Coronary angioplasty status: Secondary | ICD-10-CM | POA: Insufficient documentation

## 2020-03-28 DIAGNOSIS — Z9889 Other specified postprocedural states: Secondary | ICD-10-CM | POA: Diagnosis not present

## 2020-03-28 DIAGNOSIS — J029 Acute pharyngitis, unspecified: Secondary | ICD-10-CM | POA: Diagnosis present

## 2020-03-28 DIAGNOSIS — Z8546 Personal history of malignant neoplasm of prostate: Secondary | ICD-10-CM | POA: Insufficient documentation

## 2020-03-28 DIAGNOSIS — R1310 Dysphagia, unspecified: Secondary | ICD-10-CM | POA: Diagnosis not present

## 2020-03-28 NOTE — Discharge Instructions (Signed)
You are choosing to leave against medical advice today.  Please monitor your symptoms closely and return if your symptoms worsen in any way including drooling, worsening throat tightness, voice changes, fevers.  Please call your neurosurgeon for further recommendations.

## 2020-03-28 NOTE — ED Triage Notes (Addendum)
Pt c/o sore throat/throat clearing-feeling like "can't catch my breath" when he reclines or lies down-difficulty swallowing and choking-sx started last night-pt had cervical surgery 4/7-pt states he spoke with surgeon office and was advised to come to ED-NAD-steady gait

## 2020-03-28 NOTE — ED Provider Notes (Signed)
[facility] EMERGENCY DEPARTMENT Provider Note   CSN: [id number] Arrival date & time: 03/28/20  [id number]     History Chief Complaint  Patient presents with  . Sore Throat  . Post-op Problem    Tyler Estrada is a 64 y.o. male with history of hyperlipidemia, coronary artery disease, prostate cancer, arthritis, cervical spondylosis with myelopathy and radiculopathy presents for evaluation of acute onset, progressively worsening dysphagia and odynophagia for 3 days.  He reports that symptoms began after he had a 3 level ACDF with Dr. Christella Noa with neurosurgery.  He also had carpal tunnel release on the right side at that time.  He reports that he tolerated the procedure well and felt reasonably well at the surgery center overnight.  Yesterday he noted worsening difficulty swallowing and pain with swallowing.  He feels that the anterior aspect of the neck has swollen more.  He states his symptoms especially worsen when he lays flat and that he could not sleep last night due to feeling that his throat was closing up when he was laying flat.  He also reports that he has been clearing his throat more.  He denies drooling or fevers.  He denies chest pain or shortness of breath aside from feeling as though he cannot breathe due to the dysphagia.  He called his neurosurgeon's office and was advised to come to the ED for further evaluation and management.  The history is provided by the patient.       Past Medical History:  Diagnosis Date  . Arthritis    knees, hips, hands  . Complication of anesthesia    Woke up during hip replacement, had surgery since with no problems  . Coronary artery disease    a. s/p DES to RCA in 2008;  b. 10/2015 Cath: LM nl, LAD 50ost/p, OM1/2 min irregs, RCA 64mISR, 390m ISR, EF 65%.  . Echocardiogram    Echo 11/16: EF 60-65, normal wall motion, grade 1 diastolic dysfunction  . Grade I diastolic dysfunction 1101/75/1025 ECHO  . Hyperlipidemia   . Iron  deficiency anemia due to chronic blood loss 09/10/2015  . Melanoma ([facility]   Nose  . Pericarditis    a. 10/2015->colchicine.  . Prostate cancer ([facility]  . Ulcerative colitis with complication ([facility]9/8/52/7782  Patient Active Problem List   Diagnosis Date Noted  . Cervical spondylosis with myelopathy and radiculopathy 03/26/2020  . Prostate cancer ([facility]04/23/2020  . Obese 12/01/2018  . S/P left UKR 11/30/2018  . Pericarditis   . Elevated troponin 11/19/2015  . Acute pericarditis 11/19/2015  . Hyperlipidemia   . CAD S/P percutaneous coronary angioplasty 11/18/2015  . Acute chest pain 11/18/2015  . Chest pain 11/18/2015  . Iron deficiency anemia due to chronic blood loss 09/10/2015  . Ulcerative colitis with complication ([facility]0942/35/3614  Past Surgical History:  Procedure Laterality Date  . CARDIAC CATHETERIZATION N/A 11/18/2015   Procedure: Left Heart Cath and Coronary Angiography;  Surgeon: MiSherren MochaMD;  [facility];  Service: Cardiovascular;  Laterality: N/A;  . CERVICAL SPINE SURGERY    . COLONOSCOPY  2019  . CORONARY ANGIOPLASTY WITH STENT PLACEMENT  2008   a. Cordis stent to RCA in 2008 2.7549m 66m29m HIP ARTHROPLASTY Right   . MOHS SURGERY    . PARTIAL KNEE ARTHROPLASTY Left 11/30/2018   Procedure: LEFT UNICOMPARTMENTAL KNEE Medially;  Surgeon: OlinParalee Cancel;  [facility]: [facility];  Service: Orthopedics;  Laterality: Left;  70 mins with block  . RADIOACTIVE SEED IMPLANT N/A 08/10/2019   Procedure: RADIOACTIVE SEED IMPLANT/BRACHYTHERAPY IMPLANT;  Surgeon: Alexis Frock, MD;  Location: [facility];  Service: Urology;  Laterality: N/A;  . SPACE OAR INSTILLATION N/A 08/10/2019   Procedure: SPACE OAR INSTILLATION;  Surgeon: Alexis Frock, MD;  Location: [facility];  Service: Urology;  Laterality: N/A;  . TONSILLECTOMY     childhood       Family History  Problem Relation Age of Onset  . Lymphoma Mother   .  Hypertension Mother   . Arthritis Mother   . Cancer Mother   . CAD Father   . Heart disease Father   . Heart attack Father   . Arthritis Sister   . Heart disease Sister   . Hypertension Brother     Social History   Tobacco Use  . Smoking status: Never Smoker  . Smokeless tobacco: Never Used  Substance Use Topics  . Alcohol use: Yes    Alcohol/week: 0.0 standard drinks    Comment: 1-2 drinks per week.  . Drug use: No    Home Medications Prior to Admission medications   Medication Sig Start Date End Date Taking? Authorizing Provider  aspirin EC 81 MG tablet Take 81 mg by mouth at bedtime.     [provider]  docusate sodium (COLACE) 100 MG capsule Take 100 mg by mouth at bedtime.    [provider]  loratadine (CLARITIN) 10 MG tablet Take 10 mg by mouth daily.    [provider]  meclizine (ANTIVERT) 25 MG tablet Take 0.5-1 tablets (12.5-25 mg total) by mouth 3 (three) times daily as needed for dizziness. Patient not taking: Reported on 03/13/2020 01/10/20   Copland, Gay Filler, MD  mesalamine (LIALDA) 1.2 g EC tablet Take 1.2 g by mouth in the morning and at bedtime.     [provider]  metoprolol tartrate (LOPRESSOR) 25 MG tablet TAKE 1/2 TABLET BY MOUTH EVERY DAY 03/19/20   Fay Records, MD  Multiple Vitamins-Minerals (CENTRUM SILVER PO) Take 1 tablet by mouth daily.     [provider]  nitroGLYCERIN (NITROSTAT) 0.4 MG SL tablet Place 1 tablet (0.4 mg total) under the tongue every 5 (five) minutes as needed for chest pain. 11/12/19   Fay Records, MD  Omega-3 Fatty Acids (FISH OIL) 1200 MG CAPS Take 1,200 mg by mouth at bedtime.     [provider]  OVER THE COUNTER MEDICATION Take 1-2 capsules by mouth at bedtime as needed (pain). otc Hemp oil capsules    [provider]  oxyCODONE (OXY IR/ROXICODONE) 5 MG immediate release tablet Take 1 tablet (5 mg total) by mouth every 6 (six) hours as needed for up to 8 days  for moderate pain ((score 4 to 6)). 03/27/20 04/04/20  Ashok Pall, MD  rosuvastatin (CRESTOR) 20 MG tablet Take 1 tablet (20 mg total) by mouth daily. Patient not taking: Reported on 03/13/2020 11/21/19   Fay Records, MD  tamsulosin (FLOMAX) 0.4 MG CAPS capsule Take 0.4 mg by mouth at bedtime.    [provider]  tiZANidine (ZANAFLEX) 4 MG tablet Take 1 tablet (4 mg total) by mouth every 6 (six) hours as needed for muscle spasms. 03/27/20   Ashok Pall, MD    Allergies    Prednisone  Review of Systems   Review of Systems  Constitutional: Negative for chills and fever.  HENT:  Positive for sore throat and trouble swallowing.   Respiratory: Positive for shortness of breath.   Cardiovascular: Negative for chest pain.  Musculoskeletal: Positive for neck pain.  All other systems reviewed and are negative.   Physical Exam Updated Vital Signs BP (!) 150/79 (BP Location: Right Arm)   Pulse 100   Temp 99.7 F (37.6 C) (Oral)   Resp 18   Ht 5' 4"  (1.626 m)   Wt 85.4 kg   SpO2 98%   BMI 32.31 kg/m   Physical Exam Vitals and nursing note reviewed.  Constitutional:      General: He is not in acute distress.    Appearance: He is well-developed.  HENT:     Head: Normocephalic and atraumatic.     Nose: No congestion.     Mouth/Throat:     Mouth: Mucous membranes are moist.     Pharynx: Oropharynx is clear. No oropharyngeal exudate, posterior oropharyngeal erythema or uvula swelling.     Tonsils: 2+ on the right. 2+ on the left.     Comments: Patient frequently clearing his throat.  Tolerating secretions.  No uvular edema and uvula is midline.  Voice sounds mildly hoarse. Eyes:     General:        Right eye: No discharge.        Left eye: No discharge.     Conjunctiva/sclera: Conjunctivae normal.  Neck:     Vascular: No JVD.     Trachea: No tracheal deviation.     Comments: Mildly decreased range of motion due to recent cervical spine fusion.  No midline cervical spine  tenderness, mild paracervical soreness on palpation in the trapezius distribution bilaterally.  Well-healing surgical incision to the anterior aspect of the neck with no surrounding erythema or drainage.  There is surrounding swelling and some soreness to palpation.  No upper airway stridor. Cardiovascular:     Rate and Rhythm: Normal rate and regular rhythm.  Pulmonary:     Effort: Pulmonary effort is normal.     Breath sounds: Normal breath sounds. No wheezing.     Comments: SPO2 saturations 98% on room air. Abdominal:     General: Bowel sounds are normal. There is no distension.     Palpations: Abdomen is soft.  Musculoskeletal:     Cervical back: Neck supple.  Skin:    General: Skin is warm and dry.     Findings: No erythema.  Neurological:     Mental Status: He is alert.  Psychiatric:        Behavior: Behavior normal.     ED Results / Procedures / Treatments   Labs (all labs ordered are listed, but only abnormal results are displayed) Labs Reviewed - No data to display  EKG None  Radiology No results found.  Procedures Procedures (including critical care time)  Medications Ordered in ED Medications - No data to display  ED Course  I have reviewed the triage vital signs and the nursing notes.  Pertinent labs & imaging results that were available during my care of the patient were reviewed by me and considered in my medical decision making (see chart for details).    MDM Rules/Calculators/A&P                       Patient who is 2 days status post 3 level ACDF presents for evaluation of dysphagia, odynophagia, throat tightness.  He was sent by his neurosurgeon for further evaluation.  His symptoms  worsen when he lays flat and he states he feels as though he is choking when he does so.  He is afebrile, hypertensive in the ED but vital signs otherwise stable.  No signs of secondary skin infection, his incision appears to be well-healing.  No upper airway stridor or  angioedema.  Doubt anaphylaxis.  He is tolerating secretions but is frequently clearing his throat, feels discomfort with swallowing.  I am concerned that there may be an underlying inflammatory process or potentially failure of his hardware obstructing his airway or esophagus resulting in his symptoms.  I recommended blood work and CT soft tissue neck for further evaluation.  5:08PM Patient wants to leave against medical advice. He is refusing bloodwork and imaging. He cannot give me a reason why but he states "I just wanted something for my sore throat". Patient understands that his actions will lead to inadequate medical workup, and that he  is at risk of complications of missed diagnosis, which includes morbidity and mortality.  Alternative options discussed.  Opportunity to change mind given.  Discussion witnessed by Dr. Laverta Baltimore, who also explained to the patient and discussed the risks of leaving AMA.  Patient is demonstrating good capacity to make decision. He understands the potential  morbidity and mortality associated with leaving AMA. Patient understands that he needs to return to the ER immediately if his symptoms get worse.  He will also call his neurosurgeon to arrange close follow-up.   Final Clinical Impression(s) / ED Diagnoses Final diagnoses:  Dysphagia, unspecified type  Odynophagia  Neck swelling    Rx / DC Orders ED Discharge Orders    None       Renita Papa, PA-C 03/29/20 0004    Margette Fast, MD 03/29/20 1105

## 2020-03-29 ENCOUNTER — Encounter: Payer: Self-pay | Admitting: *Deleted

## 2020-04-21 NOTE — Progress Notes (Deleted)
[facility] [address], [place], [place] [address] [phone]  Date:  04/23/2020   Name:  Tyler Estrada   DOB:  04-Jan-1956   MRN:  [id number]  PCP:  Darreld Mclean, MD    Chief Complaint: No chief complaint on file.   History of Present Illness:  Tyler Estrada is a 64 y.o. very pleasant male patient who presents with the following:  Here today with concern of shoulder and bicep pain for about 2 months  Of note, he recently underwent a cervical decompression/discectomy/fusion at cervical 4/5, 5/6, 6/7 per Dr. Ashok Pall He also has history of ulcerative colitis, CAD status post PCI, hyperlipidemia  Patient Active Problem List   Diagnosis Date Noted  . Cervical spondylosis with myelopathy and radiculopathy 03/26/2020  . Prostate cancer ([place]) 04/12/2019  . Obese 12/01/2018  . S/P left UKR 11/30/2018  . Pericarditis   . Elevated troponin 11/19/2015  . Acute pericarditis 11/19/2015  . Hyperlipidemia   . CAD S/P percutaneous coronary angioplasty 11/18/2015  . Acute chest pain 11/18/2015  . Chest pain 11/18/2015  . Iron deficiency anemia due to chronic blood loss 09/10/2015  . Ulcerative colitis with complication ([place]) 93/81/8299    Past Medical History:  Diagnosis Date  . Arthritis    knees, hips, hands  . Complication of anesthesia    Woke up during hip replacement, had surgery since with no problems  . Coronary artery disease    a. s/p DES to RCA in 2008;  b. 10/2015 Cath: LM nl, LAD 50ost/p, OM1/2 min irregs, RCA 9mISR, 320m ISR, EF 65%.  . Echocardiogram    Echo 11/16: EF 60-65, normal wall motion, grade 1 diastolic dysfunction  . Grade I diastolic dysfunction 1137/16/9678 ECHO  . Hyperlipidemia   . Iron deficiency anemia due to chronic blood loss 09/10/2015  . Melanoma ([facility] [place]   Nose  . Pericarditis    a. 10/2015->colchicine.  . Prostate cancer ([facility]CT[place]  . Ulcerative colitis with  complication (HCTryon9/9/38/1017  Past Surgical History:  Procedure Laterality Date  . ANTERIOR CERVICAL DECOMP/DISCECTOMY FUSION N/A 03/26/2020   Procedure: Cervical Four-Five, Cervical Five-Six, Cervical Six-Seven Anterior Cervical Decompression/Discectomy/Fusion;  Surgeon: CaAshok PallMD;  [facility]: [facility]surgery;  Laterality: N/A;  3C  . CARDIAC CATHETERIZATION N/A 11/18/2015   Procedure: Left Heart Cath and Coronary Angiography;  Surgeon: MiSherren MochaMD;  [facility];  Service: Cardiovascular;  Laterality: N/A;  . CARPAL TUNNEL RELEASE Right 03/26/2020   Procedure: CARPAL TUNNEL RELEASE;  Surgeon: CaAshok PallMD;  [facility] Service: Neurosurgery;  Laterality: Right;  . CERVICAL SPINE SURGERY    . COLONOSCOPY  2019  . CORONARY ANGIOPLASTY WITH STENT PLACEMENT  2008   a. Cordis stent to RCA in 2008 2.7547m 40m55m HIP ARTHROPLASTY Right   . MOHS SURGERY    . PARTIAL KNEE ARTHROPLASTY Left 11/30/2018   Procedure: LEFT UNICOMPARTMENTAL KNEE Medially;  Surgeon: OlinParalee Cancel;  [facility];  Service: Orthopedics;  Laterality: Left;  70 mins with block  . RADIOACTIVE SEED IMPLANT N/A 08/10/2019   Procedure: RADIOACTIVE SEED IMPLANT/BRACHYTHERAPY IMPLANT;  Surgeon: MannAlexis Frock;  [facility];  Laterality: N/A;  . SPACE OAR INSTILLATION N/A 08/10/2019   Procedure: SPACE OAR INSTILLATION;  Surgeon: MannAlexis Frock;  [facility]  Service: Urology;  Laterality: N/A;  . TONSILLECTOMY     childhood    Social History   Tobacco Use  . Smoking status: Never Smoker  . Smokeless tobacco: Never Used  Substance Use Topics  . Alcohol use: Yes    Alcohol/week: 0.0 standard drinks    Comment: 1-2 drinks per week.  . Drug use: No    Family History  Problem Relation Age of Onset  . Lymphoma Mother   . Hypertension Mother   . Arthritis Mother   . Cancer Mother    . CAD Father   . Heart disease Father   . Heart attack Father   . Arthritis Sister   . Heart disease Sister   . Hypertension Brother     Allergies  Allergen Reactions  . Prednisone     Blurred vision     Medication list has been reviewed and updated.  Current Outpatient Medications on File Prior to Visit  Medication Sig Dispense Refill  . aspirin EC 81 MG tablet Take 81 mg by mouth at bedtime.     . docusate sodium (COLACE) 100 MG capsule Take 100 mg by mouth at bedtime.    Marland Kitchen loratadine (CLARITIN) 10 MG tablet Take 10 mg by mouth daily.    . meclizine (ANTIVERT) 25 MG tablet Take 0.5-1 tablets (12.5-25 mg total) by mouth 3 (three) times daily as needed for dizziness. (Patient not taking: Reported on 03/13/2020) 30 tablet 0  . mesalamine (LIALDA) 1.2 g EC tablet Take 1.2 g by mouth in the morning and at bedtime.     . metoprolol tartrate (LOPRESSOR) 25 MG tablet TAKE 1/2 TABLET BY MOUTH EVERY DAY 45 tablet 2  . Multiple Vitamins-Minerals (CENTRUM SILVER PO) Take 1 tablet by mouth daily.     . nitroGLYCERIN (NITROSTAT) 0.4 MG SL tablet Place 1 tablet (0.4 mg total) under the tongue every 5 (five) minutes as needed for chest pain. 25 tablet 3  . Omega-3 Fatty Acids (FISH OIL) 1200 MG CAPS Take 1,200 mg by mouth at bedtime.     Marland Kitchen OVER THE COUNTER MEDICATION Take 1-2 capsules by mouth at bedtime as needed (pain). otc Hemp oil capsules    . rosuvastatin (CRESTOR) 20 MG tablet Take 1 tablet (20 mg total) by mouth daily. (Patient not taking: Reported on 03/13/2020) 90 tablet 3  . tamsulosin (FLOMAX) 0.4 MG CAPS capsule Take 0.4 mg by mouth at bedtime.    Marland Kitchen tiZANidine (ZANAFLEX) 4 MG tablet Take 1 tablet (4 mg total) by mouth every 6 (six) hours as needed for muscle spasms. 60 tablet 0   No current facility-administered medications on file prior to visit.    Review of Systems:  As per HPI- otherwise negative.   Physical Examination: There were no vitals filed for this visit. There  were no vitals filed for this visit. There is no height or weight on file to calculate BMI. Ideal Body Weight:    GEN: no acute distress. HEENT: Atraumatic, Normocephalic.  Ears and Nose: No external deformity. CV: RRR, No M/G/R. No JVD. No thrill. No extra heart sounds. PULM: CTA B, no wheezes, crackles, rhonchi. No retractions. No resp. distress. No accessory muscle use. ABD: S, NT, ND, +BS. No rebound. No HSM. EXTR: No c/c/e PSYCH: Normally interactive. Conversant.    Assessment and Plan: *** This visit occurred during the SARS-CoV-2 public health emergency.  Safety protocols were in place, including screening questions prior to the visit, additional usage of staff PPE, and  extensive cleaning of exam room while observing appropriate contact time as indicated for disinfecting solutions.    Signed Lamar Blinks, MD

## 2020-04-23 ENCOUNTER — Ambulatory Visit: Payer: 59 | Admitting: Family Medicine

## 2020-04-24 ENCOUNTER — Encounter: Payer: Self-pay | Admitting: Family Medicine

## 2020-04-25 MED ORDER — MELOXICAM 7.5 MG PO TABS: 7.5000 mg | ORAL_TABLET | Freq: Every day | ORAL | 6 refills | Status: DC

## 2020-05-27 ENCOUNTER — Encounter: Payer: Self-pay | Admitting: Family Medicine

## 2020-05-27 DIAGNOSIS — M159 Polyosteoarthritis, unspecified: Secondary | ICD-10-CM

## 2020-05-27 DIAGNOSIS — M4712 Other spondylosis with myelopathy, cervical region: Secondary | ICD-10-CM

## 2020-06-04 ENCOUNTER — Encounter: Payer: Self-pay | Admitting: Family Medicine

## 2020-06-20 ENCOUNTER — Telehealth: Payer: Self-pay | Admitting: Internal Medicine

## 2020-06-20 NOTE — Telephone Encounter (Signed)
° ° ° °  Pt c/o swelling: STAT is pt has developed SOB within 24 hours  1) How much weight have you gained and in what time span? No  2) If swelling, where is the swelling located? Both hands and ankles  3) Are you currently taking a fluid pill? No  4) Are you currently SOB? No  5) Do you have a log of your daily weights (if so, list)? None   6) Have you gained 3 pounds in a day or 5 pounds in a week?   7) Have you traveled recently? No   Pt said his both hand is swelling pretty bad, his ankles is not bad in the morning but swells up during the day. Denied other symptoms or any weight gain

## 2020-06-20 NOTE — Telephone Encounter (Signed)
I spoke with patient.  He reports swelling in both hands. Started about a month ago.  States hands look like "catcher's mitts". Has trouble dressing himself due to swelling. Can't make a fist due to swelling. Initially swelling was worse in right hand but is now the same in both hands.  Also having some numbness.  MRI of both shoulders was done yesterday. He has swelling in both ankles.  OK in the morning but worsens as the day goes on. No shortness of breath. Does not weigh daily but states weight has been stable and he may have lost some weight recently.  Watches salt intake. Does not eat restaurant food often.  Will forward to Dr Harrington Challenger for review/recommendations.

## 2020-06-21 NOTE — Telephone Encounter (Signed)
Contacted Tyler Estrada    He is experiencing swelling in feet, ankles and hands   Painful on movement     No SOB I do not think the swelling is due to heart problems     Tyler Estrada is waiting for appt with Dr Lenna Gilford (rheum)   I agree with plan

## 2020-07-02 ENCOUNTER — Encounter: Payer: Self-pay | Admitting: Family Medicine

## 2020-07-02 LAB — COMPREHENSIVE METABOLIC PANEL
Calcium: 9.3 (ref 8.7–10.7)
GFR calc non Af Amer: 102

## 2020-07-02 LAB — C REACTIVE PROTEIN, FLUID: CRP: 40

## 2020-07-02 LAB — SED RATE MANUAL WEST: Sed Rate: 78

## 2020-07-02 LAB — HEPATIC FUNCTION PANEL
ALT: 17 (ref 10–40)
AST: 18 (ref 14–40)
Bilirubin, Total: 0.2

## 2020-07-02 LAB — BASIC METABOLIC PANEL
BUN: 9 (ref 4–21)
CO2: 21 (ref 13–22)
Chloride: 106 (ref 99–108)
Creatinine: 0.7 (ref 0.6–1.3)
Glucose: 114
Potassium: 3.6 (ref 3.4–5.3)
Sodium: 145 (ref 137–147)

## 2020-07-02 LAB — CBC AND DIFFERENTIAL
HCT: 30 — AB (ref 41–53)
Hemoglobin: 9.4 — AB (ref 13.5–17.5)
Platelets: 413 — AB (ref 150–399)
WBC: 6.3

## 2020-07-04 ENCOUNTER — Other Ambulatory Visit: Payer: Self-pay | Admitting: Family Medicine

## 2020-07-04 DIAGNOSIS — D649 Anemia, unspecified: Secondary | ICD-10-CM

## 2020-07-04 NOTE — Progress Notes (Signed)
Ifob mailed to patient.

## 2020-07-15 ENCOUNTER — Encounter: Payer: Self-pay | Admitting: Family Medicine

## 2020-09-11 ENCOUNTER — Encounter: Payer: Self-pay | Admitting: Family Medicine

## 2020-10-11 NOTE — Patient Instructions (Addendum)
It was great to see you again today!  I will be in touch with your labs Please be sure to get your flu shot and covid booster at your convenience Assuming all is well let's visit in 6 months I would recommend that you get a colonoscopy this year

## 2020-10-11 NOTE — Progress Notes (Addendum)
[facility] at [facility] [address] [place], [place] [address] [phone]  Date:  10/13/2020   Name:  Tyler Estrada   DOB:  10-11-1956   MRN:  [id number]  PCP:  Darreld Mclean, MD    Chief Complaint: Hyperlipidemia (lab check)   History of Present Illness:  Tyler Estrada is a 64 y.o. very pleasant male patient who presents with the following:  Patient here today for follow-up visit and labs.  History of ulcerative colitis, prostate cancer, hyperlipidemia, CAD status post stenting, pericarditis Last seen by myself in March of this year He is planning to retire at the end of the year Earlier this year he was having a lot of trouble with radiculopathy from a cervical spondylosis-he underwent a C4-7 decompression in April and has done overall quite well from that standpoint  He has been seen by [facility] for bilateral shoulder pain  His gastroenterologist is Dr. Watt Climes at [facility]-recently [name] has been noted to have some unexplained anemia, per most recent notes Dr. Watt Climes is considering doing a repeat colonoscopy to help rule out GI bleed. He is seeing him next month and they plan to do a colon for him before the end of the year   He has also been seeing rheumatology for significant arthritis in his hands-we are not quite sure of his exact dx yet.   He is on methotrexate and meloxicam - taking 2.5 mg #6 once a week  Most recent follow-up with urology was in June, treated by Dr. Suzi Roots are continuing androgen deprivation therapy for the time being.  They are doing one more shot in December and then hopefully his therapy will be complete   He was having some rash- turns out he was allergic to fragrances.  He has changed to fragrance free products and is doing better   Hepatitis C screening HIV screening Flu vaccine- he plans to do at a later date  Colon cancer screening is up-to-date Most recent routine lab work on chart-BMP,  CBC in July His hemoglobin was 9.4 at that time He is fasting today   Patient Active Problem List   Diagnosis Date Noted  . Cervical spondylosis with myelopathy and radiculopathy 03/26/2020  . Prostate cancer ([place]) 04/12/2019  . Obese 12/01/2018  . S/P left UKR 11/30/2018  . Pericarditis   . Elevated troponin 11/19/2015  . Acute pericarditis 11/19/2015  . Hyperlipidemia   . CAD S/P percutaneous coronary angioplasty 11/18/2015  . Acute chest pain 11/18/2015  . Chest pain 11/18/2015  . Iron deficiency anemia due to chronic blood loss 09/10/2015  . Ulcerative colitis with complication ([place]) 62/26/3335    Past Medical History:  Diagnosis Date  . Arthritis    knees, hips, hands  . Complication of anesthesia    Woke up during hip replacement, had surgery since with no problems  . Coronary artery disease    a. s/p DES to RCA in 2008;  b. 10/2015 Cath: LM nl, LAD 50ost/p, OM1/2 min irregs, RCA 17mISR, 320m ISR, EF 65%.  . Echocardiogram    Echo 11/16: EF 60-65, normal wall motion, grade 1 diastolic dysfunction  . Grade I diastolic dysfunction 1145/62/5638 ECHO  . Hyperlipidemia   . Iron deficiency anemia due to chronic blood loss 09/10/2015  . Melanoma ([place]   Nose  . Pericarditis    a. 10/2015->colchicine.  . Prostate cancer ([facility]  . Ulcerative colitis with complication (  [place]) 09/10/2015    Past Surgical History:  Procedure Laterality Date  . ANTERIOR CERVICAL DECOMP/DISCECTOMY FUSION N/A 03/26/2020   Procedure: Cervical Four-Five, Cervical Five-Six, Cervical Six-Seven Anterior Cervical Decompression/Discectomy/Fusion;  Surgeon: Ashok Pall, MD;  Location: [facility];  Service: Neurosurgery;  Laterality: N/A;  3C  . CARDIAC CATHETERIZATION N/A 11/18/2015   Procedure: Left Heart Cath and Coronary Angiography;  Surgeon: Sherren Mocha, MD;  Location: [facility];  Service: Cardiovascular;  Laterality: N/A;  . CARPAL TUNNEL RELEASE Right 03/26/2020   Procedure: CARPAL TUNNEL  RELEASE;  Surgeon: Ashok Pall, MD;  Location: [facility];  Service: Neurosurgery;  Laterality: Right;  . CERVICAL SPINE SURGERY    . COLONOSCOPY  2019  . CORONARY ANGIOPLASTY WITH STENT PLACEMENT  2008   a. Cordis stent to RCA in 2008 2.25m x 11m . HIP ARTHROPLASTY Right   . MOHS SURGERY    . PARTIAL KNEE ARTHROPLASTY Left 11/30/2018   Procedure: LEFT UNICOMPARTMENTAL KNEE Medially;  Surgeon: OlParalee CancelMD;  Location: [facility];  Service: Orthopedics;  Laterality: Left;  70 mins with block  . RADIOACTIVE SEED IMPLANT N/A 08/10/2019   Procedure: RADIOACTIVE SEED IMPLANT/BRACHYTHERAPY IMPLANT;  Surgeon: MaAlexis FrockMD;  [facility] Service[facility];  Laterality: N/A;  . SPACE OAR INSTILLATION N/A 08/10/2019   Procedure: SPACE OAR INSTILLATION;  Surgeon: MaAlexis FrockMD;  [facility];  Laterality: N/A;  . TONSILLECTOMY     childhood    Social History   Tobacco Use  . Smoking status: Never Smoker  . Smokeless tobacco: Never Used  Vaping Use  . Vaping Use: Never used  Substance Use Topics  . Alcohol use: Yes    Alcohol/week: 0.0 standard drinks    Comment: 1-2 drinks per week.  . Drug use: No    Family History  Problem Relation Age of Onset  . Lymphoma Mother   . Hypertension Mother   . Arthritis Mother   . Cancer Mother   . CAD Father   . Heart disease Father   . Heart attack Father   . Arthritis Sister   . Heart disease Sister   . Hypertension Brother     Allergies  Allergen Reactions  . Prednisone     Blurred vision     Medication list has been reviewed and updated.  Current Outpatient Medications on File Prior to Visit  Medication Sig Dispense Refill  . aspirin EC 81 MG tablet Take 81 mg by mouth at bedtime.     . docusate sodium (COLACE) 100 MG capsule Take 100 mg by mouth at bedtime.    . Marland Kitchenoratadine (CLARITIN) 10 MG tablet Take 10 mg by mouth daily.    . meloxicam (MOBIC) 7.5 MG  tablet Take 1 tablet (7.5 mg total) by mouth daily. Use as needed for arthritis 30 tablet 6  . mesalamine (LIALDA) 1.2 g EC tablet Take 1.2 g by mouth in the morning and at bedtime.     . metoprolol tartrate (LOPRESSOR) 25 MG tablet TAKE 1/2 TABLET BY MOUTH EVERY DAY 45 tablet 2  . Multiple Vitamins-Minerals (CENTRUM SILVER PO) Take 1 tablet by mouth daily.     . nitroGLYCERIN (NITROSTAT) 0.4 MG SL tablet Place 1 tablet (0.4 mg total) under the tongue every 5 (five) minutes as needed for chest pain. 25 tablet 3  . Omega-3 Fatty Acids (FISH OIL) 1200 MG CAPS Take 1,200 mg by mouth at bedtime.     .Marland Kitchen  OVER THE COUNTER MEDICATION Take 1-2 capsules by mouth at bedtime as needed (pain). otc Hemp oil capsules    . rosuvastatin (CRESTOR) 20 MG tablet Take 1 tablet (20 mg total) by mouth daily. 90 tablet 3  . tamsulosin (FLOMAX) 0.4 MG CAPS capsule Take 0.4 mg by mouth at bedtime.    Marland Kitchen tiZANidine (ZANAFLEX) 4 MG tablet Take 1 tablet (4 mg total) by mouth every 6 (six) hours as needed for muscle spasms. 60 tablet 0   No current facility-administered medications on file prior to visit.    Review of Systems:  As per HPI- otherwise negative.  Wt Readings from Last 3 Encounters:  10/13/20 180 lb (81.6 kg)  03/28/20 188 lb 4 oz (85.4 kg)  03/26/20 190 lb (86.2 kg)    He is not really trying to lose weight, but notes that they are eating a bit less  Physical Examination: Vitals:   10/13/20 0845  BP: 122/82  Pulse: 73  Resp: 16  SpO2: 98%   Vitals:   10/13/20 0845  Weight: 180 lb (81.6 kg)  Height: 5' 4"  (1.626 m)   Body mass index is 30.9 kg/m. Ideal Body Weight: Weight in (lb) to have BMI = 25: 145.3  GEN: no acute distress.  Overweight, looks well and his normal self  HEENT: Atraumatic, Normocephalic.  Ears and Nose: No external deformity. CV: RRR, No M/G/R. No JVD. No thrill. No extra heart sounds. PULM: CTA B, no wheezes, crackles, rhonchi. No retractions. No resp. distress. No  accessory muscle use. ABD: S, NT, ND. No rebound. No HSM. EXTR: No c/c/e PSYCH: Normally interactive. Conversant.    Assessment and Plan: Screening for HIV (human immunodeficiency virus) - Plan: HIV Antibody (routine testing w rflx)  Encounter for hepatitis C screening test for low risk patient - Plan: Hepatitis C antibody  Anemia, unspecified type - Plan: CBC  Other ulcerative colitis with complication ([place])  Hyperlipidemia, unspecified hyperlipidemia type - Plan: Lipid panel  Screening for diabetes mellitus - Plan: Hemoglobin A1c  Rheumatoid arthritis, involving unspecified site, unspecified whether rheumatoid factor present ([place]) - Plan: methotrexate (RHEUMATREX) 2.5 MG tablet  Following up today Added methotrexate to his med list- he is taking this per rheumatology  He has noted some weight loss- questionable diet change.  He will monitor and let me know if this does not stabilize Encouraged him to have a colon per GI due to anemia- he plans to do so Discussed immunizations today Will plan further follow- up pending labs. Plan to visit in 6 months assuming all is well   This visit occurred during the SARS-CoV-2 public health emergency.  Safety protocols were in place, including screening questions prior to the visit, additional usage of staff PPE, and extensive cleaning of exam room while observing appropriate contact time as indicated for disinfecting solutions.    Signed Lamar Blinks, MD  Addendum 10/26, received labs as below.  Message to patient In 2019, hemoglobin was 13.1.  Since that time, has been low.  [place] of 9.4 this past July Results for orders placed or performed in visit on 10/13/20  CBC  Result Value Ref Range   WBC 4.9 3.8 - 10.8 Thousand/uL   RBC 3.96 (L) 4.20 - 5.80 Million/uL   Hemoglobin 11.2 (L) 13.2 - 17.1 g/dL   HCT 33.8 (L) 38 - 50 %   MCV 85.4 80.0 - 100.0 fL   MCH 28.3 27.0 - 33.0 pg   MCHC 33.1 32.0 - 36.0 g/dL  RDW 15.9 (H) 11.0 -  15.0 %   Platelets 290 140 - 400 Thousand/uL   MPV 9.0 7.5 - 12.5 fL  Hemoglobin A1c  Result Value Ref Range   Hgb A1c MFr Bld 5.7 (H) <5.7 % of total Hgb   Mean Plasma Glucose 117 (calc)   eAG (mmol/L) 6.5 (calc)  Lipid panel  Result Value Ref Range   Cholesterol 180 <200 mg/dL   HDL 56 > OR = 40 mg/dL   Triglycerides 90 <150 mg/dL   LDL Cholesterol (Calc) 106 (H) mg/dL (calc)   Total CHOL/HDL Ratio 3.2 <5.0 (calc)   Non-HDL Cholesterol (Calc) 124 <130 mg/dL (calc)  Hepatitis C antibody  Result Value Ref Range   Hepatitis C Ab NON-REACTIVE NON-REACTI   SIGNAL TO CUT-OFF 0.00 <1.00

## 2020-10-13 ENCOUNTER — Ambulatory Visit (INDEPENDENT_AMBULATORY_CARE_PROVIDER_SITE_OTHER): Payer: 59 | Admitting: Family Medicine

## 2020-10-13 ENCOUNTER — Encounter: Payer: Self-pay | Admitting: Family Medicine

## 2020-10-13 ENCOUNTER — Other Ambulatory Visit: Payer: Self-pay

## 2020-10-13 VITALS — BP 122/82 | HR 73 | Resp 16 | Ht 64.0 in | Wt 180.0 lb

## 2020-10-13 DIAGNOSIS — Z131 Encounter for screening for diabetes mellitus: Secondary | ICD-10-CM

## 2020-10-13 DIAGNOSIS — E785 Hyperlipidemia, unspecified: Secondary | ICD-10-CM

## 2020-10-13 DIAGNOSIS — D649 Anemia, unspecified: Secondary | ICD-10-CM | POA: Diagnosis not present

## 2020-10-13 DIAGNOSIS — Z114 Encounter for screening for human immunodeficiency virus [HIV]: Secondary | ICD-10-CM

## 2020-10-13 DIAGNOSIS — Z1159 Encounter for screening for other viral diseases: Secondary | ICD-10-CM

## 2020-10-13 DIAGNOSIS — K51819 Other ulcerative colitis with unspecified complications: Secondary | ICD-10-CM

## 2020-10-13 DIAGNOSIS — M069 Rheumatoid arthritis, unspecified: Secondary | ICD-10-CM

## 2020-10-13 MED ORDER — METHOTREXATE 2.5 MG PO TABS: 15.0000 mg | ORAL_TABLET | ORAL | 0 refills | Status: AC

## 2020-10-14 ENCOUNTER — Encounter: Payer: Self-pay | Admitting: Family Medicine

## 2020-10-14 LAB — LIPID PANEL
Cholesterol: 180 mg/dL (ref <200)
HDL: 56 mg/dL (ref >=40)
LDL Cholesterol (Calc): 106 mg/dL (calc) — ABNORMAL HIGH
Non-HDL Cholesterol (Calc): 124 mg/dL (calc) (ref <130)
Total CHOL/HDL Ratio: 3.2 (calc) (ref <5.0)
Triglycerides: 90 mg/dL (ref <150)

## 2020-10-14 LAB — HEMOGLOBIN A1C
Hgb A1c MFr Bld: 5.7 % of total Hgb — ABNORMAL HIGH (ref <5.7)
Mean Plasma Glucose: 117 (calc)
eAG (mmol/L): 6.5 (calc)

## 2020-10-14 LAB — CBC
HCT: 33.8 % — ABNORMAL LOW (ref 38.5–50.0)
Hemoglobin: 11.2 g/dL — ABNORMAL LOW (ref 13.2–17.1)
MCH: 28.3 pg (ref 27.0–33.0)
MCHC: 33.1 g/dL (ref 32.0–36.0)
MCV: 85.4 fL (ref 80.0–100.0)
MPV: 9 fL (ref 7.5–12.5)
Platelets: 290 10*3/uL (ref 140–400)
RBC: 3.96 10*6/uL — ABNORMAL LOW (ref 4.20–5.80)
RDW: 15.9 % — ABNORMAL HIGH (ref 11.0–15.0)
WBC: 4.9 10*3/uL (ref 3.8–10.8)

## 2020-10-14 LAB — HEPATITIS C ANTIBODY
Hepatitis C Ab: NONREACTIVE
SIGNAL TO CUT-OFF: 0 (ref <1.00)

## 2020-10-14 LAB — HIV ANTIBODY (ROUTINE TESTING W REFLEX): HIV 1&2 Ab, 4th Generation: NONREACTIVE

## 2020-10-21 LAB — HEPATIC FUNCTION PANEL
ALT: 20 (ref 10–40)
AST: 21 (ref 14–40)
Alkaline Phosphatase: 114 (ref 25–125)
Bilirubin, Total: 0.3

## 2020-10-21 LAB — BASIC METABOLIC PANEL
BUN: 15 (ref 4–21)
CO2: 26 — AB (ref 13–22)
Chloride: 105 (ref 99–108)
Creatinine: 0.8 (ref 0.6–1.3)
Glucose: 117
Potassium: 4.7 (ref 3.4–5.3)
Sodium: 142 (ref 137–147)

## 2020-10-21 LAB — COMPREHENSIVE METABOLIC PANEL
Albumin: 4.1 (ref 3.5–5.0)
Calcium: 9.3 (ref 8.7–10.7)
GFR calc non Af Amer: 94

## 2020-10-23 ENCOUNTER — Encounter: Payer: Self-pay | Admitting: Family Medicine

## 2020-10-23 NOTE — Telephone Encounter (Signed)
Patient sent recent lab results. FYI.

## 2020-10-30 ENCOUNTER — Encounter: Payer: Self-pay | Admitting: Family Medicine

## 2020-11-07 ENCOUNTER — Ambulatory Visit: Payer: 59 | Attending: Internal Medicine

## 2020-11-07 ENCOUNTER — Other Ambulatory Visit (HOSPITAL_BASED_OUTPATIENT_CLINIC_OR_DEPARTMENT_OTHER): Payer: Self-pay | Admitting: Internal Medicine

## 2020-11-07 DIAGNOSIS — Z23 Encounter for immunization: Secondary | ICD-10-CM

## 2020-11-07 NOTE — Progress Notes (Signed)
   Covid-19 Vaccination Clinic  Name:  Georgie Haque    MRN: [id number] DOB: [birth year]  11/07/2020  Mr. Hoffer was observed post Covid-19 immunization for 15 minutes without incident. He was provided with Vaccine Information Sheet and instruction to access the V-Safe system.   Mr. Kraai was instructed to call 911 with any severe reactions post vaccine: Marland Kitchen Difficulty breathing  . Swelling of face and throat  . A fast heartbeat  . A bad rash all over body  . Dizziness and weakness   Immunizations Administered    No immunizations on file.

## 2020-11-14 ENCOUNTER — Other Ambulatory Visit: Payer: Self-pay | Admitting: Family Medicine

## 2020-11-25 ENCOUNTER — Telehealth: Payer: Self-pay | Admitting: *Deleted

## 2020-11-25 NOTE — Telephone Encounter (Signed)
   Primary Cardiologist: Dorris Carnes, MD  Chart reviewed as part of pre-operative protocol coverage. Because of Tyler Estrada's past medical history and time since last visit, he will require a follow-up visit in order to better assess preoperative cardiovascular risk.  Pre-op covering staff: - Please schedule appointment and call patient to inform them. If patient already had an upcoming appointment within acceptable timeframe, please add "pre-op clearance" to the appointment notes so provider is aware. - Please contact requesting surgeon's office via preferred method (i.e, phone, fax) to inform them of need for appointment prior to surgery.  If applicable, this message will also be routed to pharmacy pool and/or primary cardiologist for input on holding anticoagulant/antiplatelet agent as requested below so that this information is available to the clearing provider at time of patient's appointment.   Kerin Ransom, PA-C  11/25/2020, 4:22 PM

## 2020-11-25 NOTE — Telephone Encounter (Signed)
   [facility] Pre-operative Risk Assessment    HEARTCARE STAFF: - Please ensure there is not already an duplicate clearance open for this procedure. - Under Visit Info/Reason for Call, type in Other and utilize the format Clearance MM/DD/YY or Clearance TBD. Do not use dashes or single digits. - If request is for dental extraction, please clarify the # of teeth to be extracted.  Request for surgical clearance:  1. What type of surgery is being performed?  COLONOSCOPY   2. When is this surgery scheduled?  12/04/20   3. What type of clearance is required (medical clearance vs. Pharmacy clearance to hold med vs. Both)?  BOTH  4. Are there any medications that need to be held prior to surgery and how long? PLAVIX   5. Practice name and name of physician performing surgery?  [facility] / DR. MAGOD   6. What is the office phone number?  [phone]   7.   What is the office fax number?  [phone]  8.   Anesthesia type (None, local, MAC, general) ?  PROPOFOL   Tyler Estrada 11/25/2020, 3:06 PM  _________________________________________________________________   (provider comments below)

## 2020-11-26 NOTE — Telephone Encounter (Signed)
I s/w the pt and explained that per Dr. Harrington Challenger he was going to need a pre op appt. Pt is currently in [place] and will be back by Monday morning 12/01/20. I reviewed the schedules while I was on the phone with the pt earlier and I could not find any available times for the pt to be seen before the 12/04/20 when his procedure is. I d/w Kerin Ransom, PAC who gave me permission to use 12/01/20 11:15 TOC appt. I have notes to appt notes permission given by PAC to use TOC.   I then called the pt back and informed him he has been scheduled for 12/01/20 11:15 with Kerin Ransom, PAC at the [place] office. Pt gave understanding where [facility] office is located. Pt thanked me for the call and the help. I will send clearance notes to [facility] for appt. Will send FYI to requesting office pt has appt. Will remove from the pre op call back pool.

## 2020-12-01 ENCOUNTER — Encounter: Payer: Self-pay | Admitting: Cardiology

## 2020-12-01 ENCOUNTER — Ambulatory Visit (INDEPENDENT_AMBULATORY_CARE_PROVIDER_SITE_OTHER): Payer: 59 | Admitting: Cardiology

## 2020-12-01 ENCOUNTER — Other Ambulatory Visit: Payer: Self-pay

## 2020-12-01 VITALS — BP 132/70 | HR 74 | Ht 64.0 in | Wt 185.2 lb

## 2020-12-01 DIAGNOSIS — Z9861 Coronary angioplasty status: Secondary | ICD-10-CM

## 2020-12-01 DIAGNOSIS — E785 Hyperlipidemia, unspecified: Secondary | ICD-10-CM

## 2020-12-01 DIAGNOSIS — I10 Essential (primary) hypertension: Secondary | ICD-10-CM

## 2020-12-01 DIAGNOSIS — I2510 Atherosclerotic heart disease of native coronary artery without angina pectoris: Secondary | ICD-10-CM

## 2020-12-01 DIAGNOSIS — Z01818 Encounter for other preprocedural examination: Secondary | ICD-10-CM | POA: Diagnosis not present

## 2020-12-01 DIAGNOSIS — Z8679 Personal history of other diseases of the circulatory system: Secondary | ICD-10-CM

## 2020-12-01 NOTE — Patient Instructions (Signed)
Medication Instructions:  Continue current medication  *If you need a refill on your cardiac medications before your next appointment, please call your pharmacy*   Lab Work: None Ordered   Testing/Procedures: None Ordered   Follow-Up: At [facility], you and your health needs are our priority.  As part of our continuing mission to provide you with exceptional heart care, we have created designated Provider Care Teams.  These Care Teams include your primary Cardiologist (physician) and Advanced Practice Providers (APPs -  Physician Assistants and Nurse Practitioners) who all work together to provide you with the care you need, when you need it.  We recommend signing up for the patient portal called "MyChart".  Sign up information is provided on this After Visit Summary.  MyChart is used to connect with patients for Virtual Visits (Telemedicine).  Patients are able to view lab/test results, encounter notes, upcoming appointments, etc.  Non-urgent messages can be sent to your provider as well.   To learn more about what you can do with MyChart, go to [email or web address].    Your next appointment:   1 year(s)  The format for your next appointment:   In Person  Provider:   You may see Dorris Carnes, MD or one of the following Advanced Practice Providers on your designated Care Team:    Richardson Dopp, PA-C  [place], [place]

## 2020-12-01 NOTE — Assessment & Plan Note (Signed)
Controlled.  

## 2020-12-01 NOTE — Assessment & Plan Note (Signed)
Pt is an acceptable risk for the proposed procedure without further cardiac work up.

## 2020-12-01 NOTE — Progress Notes (Signed)
Cardiology Office Note:    Date:  12/01/2020   ID:  Tyler Estrada, DOB 1956-09-28, MRN [id number]  PCP:  Darreld Mclean, MD  Cardiologist:  Dorris Carnes, MD  Electrophysiologist:  None   Referring MD: Darreld Mclean, MD   No chief complaint on file.   History of Present Illness:    Tyler Estrada is a pleasant 64 y.o. male with a hx of CAD, status post RCA stenting in 2008.  Catheterization in 2016 showed patent stents in the RCA no other significant coronary disease.  Echocardiogram in 2016 showed preserved LV function.  Other medical history includes a history of remote pericarditis, treated hypertension, treated dyslipidemia, and a history of ulcerative colitis.  Tyler Estrada is in the office today for preop clearance prior to colonoscopy which is scheduled for later this week.  He denies any chest pain.  He is able go up and down a flight of stairs and walk around the block without problem.  He is not use nitroglycerin and denies any unusual dyspnea or palpitations.  Past Medical History:  Diagnosis Date  . Arthritis    knees, hips, hands  . Complication of anesthesia    Woke up during hip replacement, had surgery since with no problems  . Coronary artery disease    a. s/p DES to RCA in 2008;  b. 10/2015 Cath: LM nl, LAD 50ost/p, OM1/2 min irregs, RCA 74mISR, 383m ISR, EF 65%.  . Echocardiogram    Echo 11/16: EF 60-65, normal wall motion, grade 1 diastolic dysfunction  . Grade I diastolic dysfunction 1140/98/1191 ECHO  . Hyperlipidemia   . Iron deficiency anemia due to chronic blood loss 09/10/2015  . Melanoma ([facility]   Nose  . Pericarditis    a. 10/2015->colchicine.  . Prostate cancer ([facility]  . Ulcerative colitis with complication ([facility]9/4/78/2956  Past Surgical History:  Procedure Laterality Date  . ANTERIOR CERVICAL DECOMP/DISCECTOMY FUSION N/A 03/26/2020   Procedure: Cervical Four-Five, Cervical Five-Six, Cervical Six-Seven Anterior Cervical  Decompression/Discectomy/Fusion;  Surgeon: CaAshok PallMD;  [facility]: [facility];  Laterality: N/A;  3C  . CARDIAC CATHETERIZATION N/A 11/18/2015   Procedure: Left Heart Cath and Coronary Angiography;  Surgeon: MiSherren MochaMD;  [facility];  Service: Cardiovascular;  Laterality: N/A;  . CARPAL TUNNEL RELEASE Right 03/26/2020   Procedure: CARPAL TUNNEL RELEASE;  Surgeon: CaAshok PallMD;  [facility]: [facility] Service: Neurosurgery;  Laterality: Right;  . CERVICAL SPINE SURGERY    . COLONOSCOPY  2019  . CORONARY ANGIOPLASTY WITH STENT PLACEMENT  2008   a. Cordis stent to RCA in 2008 2.7585m 54m47m HIP ARTHROPLASTY Right   . MOHS SURGERY    . PARTIAL KNEE ARTHROPLASTY Left 11/30/2018   Procedure: LEFT UNICOMPARTMENTAL KNEE Medially;  Surgeon: OlinParalee Cancel;  [facility];  Service: Orthopedics;  Laterality: Left;  70 mins with block  . RADIOACTIVE SEED IMPLANT N/A 08/10/2019   Procedure: RADIOACTIVE SEED IMPLANT/BRACHYTHERAPY IMPLANT;  Surgeon: MannAlexis Frock;  [facility];  Laterality: N/A;  . SPACE OAR INSTILLATION N/A 08/10/2019   Procedure: SPACE OAR INSTILLATION;  Surgeon: MannAlexis Frock;  [facility];  Laterality: N/A;  . TONSILLECTOMY     childhood    Current Medications: Current Meds  Medication Sig  . aspirin EC 81 MG tablet Take 81 mg by mouth at  bedtime.   . docusate sodium (COLACE) 100 MG capsule Take 100 mg by mouth at bedtime.  Marland Kitchen loratadine (CLARITIN) 10 MG tablet Take 10 mg by mouth daily.  . meloxicam (MOBIC) 7.5 MG tablet TAKE 1 TABLET (7.5 MG TOTAL) BY MOUTH DAILY. USE AS NEEDED FOR ARTHRITIS  . mesalamine (LIALDA) 1.2 g EC tablet Take 1.2 g by mouth in the morning and at bedtime.   . methotrexate (RHEUMATREX) 2.5 MG tablet Take 6 tablets (15 mg total) by mouth once a week. Caution:Chemotherapy. Protect from light.  .  metoprolol tartrate (LOPRESSOR) 25 MG tablet TAKE 1/2 TABLET BY MOUTH EVERY DAY  . Multiple Vitamins-Minerals (CENTRUM SILVER PO) Take 1 tablet by mouth daily.   . nitroGLYCERIN (NITROSTAT) 0.4 MG SL tablet Place 1 tablet (0.4 mg total) under the tongue every 5 (five) minutes as needed for chest pain.  . Omega-3 Fatty Acids (FISH OIL) 1200 MG CAPS Take 1,200 mg by mouth at bedtime.   Marland Kitchen OVER THE COUNTER MEDICATION Take 1-2 capsules by mouth at bedtime as needed (pain). otc Hemp oil capsules  . rosuvastatin (CRESTOR) 20 MG tablet Take 1 tablet (20 mg total) by mouth daily.  . tamsulosin (FLOMAX) 0.4 MG CAPS capsule Take 0.4 mg by mouth at bedtime.     Allergies:   Prednisone   Social History   Socioeconomic History  . Marital status: Married    Spouse name: Not on file  . Number of children: 0  . Years of education: Not on file  . Highest education level: Not on file  Occupational History    Comment: full time  Tobacco Use  . Smoking status: Never Smoker  . Smokeless tobacco: Never Used  Vaping Use  . Vaping Use: Never used  Substance and Sexual Activity  . Alcohol use: Yes    Alcohol/week: 0.0 standard drinks    Comment: 1-2 drinks per week.  . Drug use: No  . Sexual activity: Not on file  Other Topics Concern  . Not on file  Social History Narrative  . Not on file   Social Determinants of Health   Financial Resource Strain: Not on file  Food Insecurity: Not on file  Transportation Needs: Not on file  Physical Activity: Not on file  Stress: Not on file  Social Connections: Not on file     Family History: The patient's family history includes Arthritis in his mother and sister; CAD in his father; Cancer in his mother; Heart attack in his father; Heart disease in his father and sister; Hypertension in his brother and mother; Lymphoma in his mother.  ROS:   Please see the history of present illness.     All other systems reviewed and are negative.  EKGs/Labs/Other  Studies Reviewed:    The following studies were reviewed today: Cath- 11/18/2015- 1. Patent stents in the RCA with mild in-stent restenosis 2. Moderate proximal LAD stenosis, do not suspect 'flow-limiting' lesion 3. Widely patent and large left circumflex without stenosis 4. Vigorous LV systolic function with normal LVEDP   EKG:  EKG is  ordered today.  The ekg ordered today demonstrates NSR-74, PVC  Recent Labs: 01/02/2020: TSH 1.48 10/13/2020: Hemoglobin 11.2; Platelets 290 10/21/2020: ALT 20; BUN 15; Creatinine 0.8; Potassium 4.7; Sodium 142  Recent Lipid Panel    Component Value Date/Time   CHOL 180 10/13/2020 0908   CHOL 158 11/12/2019 1527   TRIG 90 10/13/2020 0908   HDL 56 10/13/2020 0908   HDL 52 11/12/2019  1527   CHOLHDL 3.2 10/13/2020 0908   VLDL 14 10/07/2016 0857   LDLCALC 106 (H) 10/13/2020 0908    Physical Exam:    VS:  BP 132/70   Pulse 74   Ht 5' 4"  (1.626 m)   Wt 185 lb 3.2 oz (84 kg)   BMI 31.79 kg/m     Wt Readings from Last 3 Encounters:  12/01/20 185 lb 3.2 oz (84 kg)  10/13/20 180 lb (81.6 kg)  03/28/20 188 lb 4 oz (85.4 kg)     GEN:  Well nourished, well developed in no acute distress HEENT: Normal NECK: No JVD; No carotid bruits CARDIAC: RRR, no murmurs, rubs, gallops RESPIRATORY:  Clear to auscultation without rales, wheezing or rhonchi  ABDOMEN: Soft, non-tender, non-distended MUSCULOSKELETAL:  No edema; No deformity  SKIN: Warm and dry NEUROLOGIC:  Alert and oriented x 3 PSYCHIATRIC:  Normal affect   ASSESSMENT:    Pre-op evaluation Pt is an acceptable risk for the proposed procedure without further cardiac work up.   CAD S/P percutaneous coronary angioplasty RCA PCI in '08, patent at cath 2016 Normal LVF  Essential hypertension Controlled  Dyslipidemia, goal LDL below 70 PCP follows  PLAN:    I will fax clearance vis Epic.  F/U Dr Harrington Challenger in one year.   Medication Adjustments/Labs and Tests Ordered: Current medicines  are reviewed at length with the patient today.  Concerns regarding medicines are outlined above.  Orders Placed This Encounter  Procedures  . EKG 12-Lead   No orders of the defined types were placed in this encounter.   Patient Instructions  Medication Instructions:  Continue current medication  *If you need a refill on your cardiac medications before your next appointment, please call your pharmacy*   Lab Work: None Ordered   Testing/Procedures: None Ordered   Follow-Up: At [facility], you and your health needs are our priority.  As part of our continuing mission to provide you with exceptional heart care, we have created designated Provider Care Teams.  These Care Teams include your primary Cardiologist (physician) and Advanced Practice Providers (APPs -  Physician Assistants and Nurse Practitioners) who all work together to provide you with the care you need, when you need it.  We recommend signing up for the patient portal called "MyChart".  Sign up information is provided on this After Visit Summary.  MyChart is used to connect with patients for Virtual Visits (Telemedicine).  Patients are able to view lab/test results, encounter notes, upcoming appointments, etc.  Non-urgent messages can be sent to your provider as well.   To learn more about what you can do with MyChart, go to [email or web address].    Your next appointment:   1 year(s)  The format for your next appointment:   In Person  Provider:   You may see Dorris Carnes, MD or one of the following Advanced Practice Providers on your designated Care Team:    Richardson Dopp, PA-C  Vin [address], PA-C        Signed, Kerin Ransom, [place]  12/01/2020 11:22 AM    [place]

## 2020-12-01 NOTE — Assessment & Plan Note (Signed)
RCA PCI in '08, patent at cath 2016 Normal LVF

## 2020-12-01 NOTE — Telephone Encounter (Signed)
   Primary Cardiologist: Dorris Carnes, MD  Chart reviewed and patient seen in the office today as part of pre-operative protocol coverage. Given past medical history and time since last visit, based on ACC/AHA guidelines, Tyler Estrada would be at acceptable risk for the planned procedure without further cardiovascular testing.   OK to hold aspirin 3-5 days if needed.  The patient was advised that if he develops new symptoms prior to surgery to contact our office to arrange for a follow-up visit, and he verbalized understanding.  I will route this recommendation to the requesting party via Epic fax function and remove from pre-op pool.  Please call with questions.  Kerin Ransom, PA-C 12/01/2020, 11:25 AM

## 2020-12-01 NOTE — Assessment & Plan Note (Signed)
PCP follows

## 2020-12-04 ENCOUNTER — Ambulatory Visit (INDEPENDENT_AMBULATORY_CARE_PROVIDER_SITE_OTHER): Payer: 59 | Admitting: Family Medicine

## 2020-12-04 ENCOUNTER — Other Ambulatory Visit: Payer: Self-pay

## 2020-12-04 ENCOUNTER — Encounter: Payer: Self-pay | Admitting: Family Medicine

## 2020-12-04 VITALS — BP 120/76 | HR 88 | Resp 16 | Ht 64.0 in | Wt 178.0 lb

## 2020-12-04 DIAGNOSIS — L309 Dermatitis, unspecified: Secondary | ICD-10-CM

## 2020-12-04 MED ORDER — TRIAMCINOLONE ACETONIDE 0.5 % EX OINT: 1.0000 "application " | TOPICAL_OINTMENT | Freq: Two times a day (BID) | CUTANEOUS | 1 refills | Status: AC

## 2020-12-04 MED ORDER — MONTELUKAST SODIUM 10 MG PO TABS: 10.0000 mg | ORAL_TABLET | Freq: Every day | ORAL | 3 refills | Status: DC

## 2020-12-04 NOTE — Progress Notes (Signed)
[facility] [address], [place], [place] [address] [phone]  Date:  12/04/2020   Name:  Tyler Estrada   DOB:  1956/07/31   MRN:  [id number]  PCP:  Darreld Mclean, MD    Chief Complaint: Rash (All over body, two years, saw derm no better/)   History of Present Illness:  Tyler Estrada is a 64 y.o. very pleasant male patient who presents with the following:  Patient here today with concern of a rash History of ulcerative colitis, prostate cancer, hyperlipidemia, CAD status post stenting, pericarditis Earlier this year he was having a lot of trouble with radiculopathy from a cervical spondylosis-he underwent a C4-7 decompression in April and has done overall quite well from that standpoint  I last saw him in October.  At that point he was taking methotrexate for arthritis per rheumatology- this has helped with his hand arthritis pain He also had noticed a rash over the last 2 years, which resolved temporarily when he stopped using any products with fragrance.  He notes that the severity has waxed and waned over the last couple of years He did see dermatology 6-8 weeks ago-patient reports diagnosis is uncertain They did take a bx- inconclusive per pt reports He did have scratch testing per allergist also at some point - maybe 6-8 weeks ago.  Per his understanding, it is also is inconclusive Flu vaccine- plans to do this weekend  COVID booster done  He has asked his rheumatologist about the rash-they were not sure  He has some cream at home- he is not using it right now however, is not sure what cream it was  He has a very itchy, discrete rash which is worse on his legs, but also present on arms, abdomen, upper back.  He notes he will scratch it until he believes, he may even scratch it during his sleep Patient Active Problem List   Diagnosis Date Noted  . Pre-op evaluation 12/01/2020  . Essential hypertension  12/01/2020  . Cervical spondylosis with myelopathy and radiculopathy 03/26/2020  . Prostate cancer ([place]) 04/12/2019  . Obese 12/01/2018  . S/P left UKR 11/30/2018  . H/O pericarditis   . Elevated troponin 11/19/2015  . Dyslipidemia, goal LDL below 70   . CAD S/P percutaneous coronary angioplasty 11/18/2015  . Chest pain 11/18/2015  . Iron deficiency anemia due to chronic blood loss 09/10/2015  . Ulcerative colitis with complication ([place]) 35/45/6256    Past Medical History:  Diagnosis Date  . Arthritis    knees, hips, hands  . Complication of anesthesia    Woke up during hip replacement, had surgery since with no problems  . Coronary artery disease    a. s/p DES to RCA in 2008;  b. 10/2015 Cath: LM nl, LAD 50ost/p, OM1/2 min irregs, RCA 43mISR, 33m ISR, EF 65%.  . Echocardiogram    Echo 11/16: EF 60-65, normal wall motion, grade 1 diastolic dysfunction  . Grade I diastolic dysfunction 1138/93/7342 ECHO  . Hyperlipidemia   . Iron deficiency anemia due to chronic blood loss 09/10/2015  . Melanoma ([facility]   Nose  . Pericarditis    a. 10/2015->colchicine.  . Prostate cancer ([facility]  . Ulcerative colitis with complication ([facility]9/8/76/8115  Past Surgical History:  Procedure Laterality Date  . ANTERIOR CERVICAL DECOMP/DISCECTOMY FUSION N/A 03/26/2020   Procedure: Cervical Four-Five, Cervical Five-Six, Cervical Six-Seven Anterior Cervical Decompression/Discectomy/Fusion;  Surgeon: Ashok Pall, MD;  Location: [place];  Service: Neurosurgery;  Laterality: N/A;  3C  . CARDIAC CATHETERIZATION N/A 11/18/2015   Procedure: Left Heart Cath and Coronary Angiography;  Surgeon: Sherren Mocha, MD;  Location: [facility] [facility];  Service: Cardiovascular;  Laterality: N/A;  . CARPAL TUNNEL RELEASE Right 03/26/2020   Procedure: CARPAL TUNNEL RELEASE;  Surgeon: Ashok Pall, MD;  Location: [place];  Service: Neurosurgery;  Laterality: Right;  . CERVICAL SPINE SURGERY    . COLONOSCOPY  2019  .  CORONARY ANGIOPLASTY WITH STENT PLACEMENT  2008   a. Cordis stent to RCA in 2008 2.88m x 129m . HIP ARTHROPLASTY Right   . MOHS SURGERY    . PARTIAL KNEE ARTHROPLASTY Left 11/30/2018   Procedure: LEFT UNICOMPARTMENTAL KNEE Medially;  Surgeon: OlParalee CancelMD;  [facility];  Service: Orthopedics;  Laterality: Left;  70 mins with block  . RADIOACTIVE SEED IMPLANT N/A 08/10/2019   Procedure: RADIOACTIVE SEED IMPLANT/BRACHYTHERAPY IMPLANT;  Surgeon: MaAlexis FrockMD;  [facility];  Laterality: N/A;  . SPACE OAR INSTILLATION N/A 08/10/2019   Procedure: SPACE OAR INSTILLATION;  Surgeon: MaAlexis FrockMD;  [facility];  Laterality: N/A;  . TONSILLECTOMY     childhood    Social History   Tobacco Use  . Smoking status: Never Smoker  . Smokeless tobacco: Never Used  Vaping Use  . Vaping Use: Never used  Substance Use Topics  . Alcohol use: Yes    Alcohol/week: 0.0 standard drinks    Comment: 1-2 drinks per week.  . Drug use: No    Family History  Problem Relation Age of Onset  . Lymphoma Mother   . Hypertension Mother   . Arthritis Mother   . Cancer Mother   . CAD Father   . Heart disease Father   . Heart attack Father   . Arthritis Sister   . Heart disease Sister   . Hypertension Brother     Allergies  Allergen Reactions  . Prednisone     Blurred vision     Medication list has been reviewed and updated.  Current Outpatient Medications on File Prior to Visit  Medication Sig Dispense Refill  . aspirin EC 81 MG tablet Take 81 mg by mouth at bedtime.     . docusate sodium (COLACE) 100 MG capsule Take 100 mg by mouth at bedtime.    . Marland Kitchenoratadine (CLARITIN) 10 MG tablet Take 10 mg by mouth daily.    . meloxicam (MOBIC) 7.5 MG tablet TAKE 1 TABLET (7.5 MG TOTAL) BY MOUTH DAILY. USE AS NEEDED FOR ARTHRITIS 30 tablet 6  . mesalamine (LIALDA) 1.2 g EC tablet Take 1.2 g by mouth in  the morning and at bedtime.     . methotrexate (RHEUMATREX) 2.5 MG tablet Take 6 tablets (15 mg total) by mouth once a week. Caution:Chemotherapy. Protect from light. 20 tablet 0  . metoprolol tartrate (LOPRESSOR) 25 MG tablet TAKE 1/2 TABLET BY MOUTH EVERY DAY 45 tablet 2  . Multiple Vitamins-Minerals (CENTRUM SILVER PO) Take 1 tablet by mouth daily.     . nitroGLYCERIN (NITROSTAT) 0.4 MG SL tablet Place 1 tablet (0.4 mg total) under the tongue every 5 (five) minutes as needed for chest pain. 25 tablet 3  . Omega-3 Fatty Acids (FISH OIL) 1200 MG CAPS Take 1,200 mg by mouth at bedtime.     . Marland KitchenVER THE COUNTER MEDICATION Take  1-2 capsules by mouth at bedtime as needed (pain). otc Hemp oil capsules    . rosuvastatin (CRESTOR) 20 MG tablet Take 1 tablet (20 mg total) by mouth daily. 90 tablet 3  . tamsulosin (FLOMAX) 0.4 MG CAPS capsule Take 0.4 mg by mouth at bedtime.     No current facility-administered medications on file prior to visit.    Review of Systems:  As per HPI- otherwise negative.   Physical Examination: Vitals:   12/04/20 1350  BP: 120/76  Pulse: 88  Resp: 16  SpO2: 98%   Vitals:   12/04/20 1350  Weight: 178 lb (80.7 kg)  Height: 5' 4"  (1.626 m)   Body mass index is 30.55 kg/m. Ideal Body Weight: Weight in (lb) to have BMI = 25: 145.3  GEN: no acute distress.  Overweight, looks well HEENT: Atraumatic, Normocephalic.  Ears and Nose: No external deformity. CV: RRR, No M/G/R. No JVD. No thrill. No extra heart sounds. PULM: CTA B, no wheezes, crackles, rhonchi. No retractions. No resp. distress. No accessory muscle use. EXTR: No c/c/e PSYCH: Normally interactive. Conversant.  He has discrete, excoriated macules over his skin, most severe on his legs.  Some of them are a bit larger and appear similar to a psoriasis plaque.   Assessment and Plan: Dermatitis - Plan: montelukast (SINGULAIR) 10 MG tablet, triamcinolone ointment (KENALOG) 0.5 %  Patient today with a  nonspecific dermatitis.  He has seen dermatology and an allergist without a definite diagnosis Advised patient that we may not be able to put a name on this rash, and we will likely need to manage it as a long-term condition.  I suspect this has some elements of eczema-advised him to minimize exposure to cold water, avoid any irritating products/scented or dyed products We will try adding Singulair to his regimen for itch reduction, he is already taking better at night Prescribed triamcinolone ointment to use in a pulsed fashion as necessary See patient instructions for further tips about rash management This visit occurred during the SARS-CoV-2 public health emergency.  Safety protocols were in place, including screening questions prior to the visit, additional usage of staff PPE, and extensive cleaning of exam room while observing appropriate contact time as indicated for disinfecting solutions.    Signed Lamar Blinks, MD

## 2020-12-04 NOTE — Patient Instructions (Addendum)
It was great to see you again today, as always! I am sorry this rash has been such a problem for you.  This does seem to be at least partially a dry skin issue.  I might suggest showering every other day during the cold weather months, make your shower as quick as possible and avoid very hot water.  Immediately after showering, dry often apply a thick moisturizer.  You might also try some un-scented baby oil. You can use the triamcinolone cream on the worst areas  twice a day as needed. Add singulair to your daily regimen for suppression of allergy symptoms Continue zyrtec Wear soft gloves or socks on your hands at night so you don't scratch yourself  Please let me know if these measures are helping you at all!

## 2020-12-05 ENCOUNTER — Other Ambulatory Visit: Payer: Self-pay

## 2020-12-05 MED ORDER — METOPROLOL TARTRATE 25 MG PO TABS
12.5000 mg | ORAL_TABLET | Freq: Every day | ORAL | 3 refills | Status: DC
Start: 2020-12-05 — End: 2022-02-24

## 2020-12-17 ENCOUNTER — Encounter: Payer: Self-pay | Admitting: Family Medicine

## 2020-12-17 ENCOUNTER — Telehealth (INDEPENDENT_AMBULATORY_CARE_PROVIDER_SITE_OTHER): Payer: 59 | Admitting: Family Medicine

## 2020-12-17 ENCOUNTER — Other Ambulatory Visit: Payer: Self-pay

## 2020-12-17 VITALS — BP 142/82 | HR 97 | Temp 97.4°F | Wt 178.0 lb

## 2020-12-17 DIAGNOSIS — J069 Acute upper respiratory infection, unspecified: Secondary | ICD-10-CM

## 2020-12-17 NOTE — Progress Notes (Signed)
[facility] [address] [place], [place] [address] [phone]  Date:  12/17/2020   Name:  Tyler Estrada   DOB:  [birth year]   MRN:  [id number]  PCP:  Darreld Mclean, MD    Chief Complaint: Cough (Headache, runny nose, cough, no fever, not tested for covid, needs work letter)   History of Present Illness:  Tyler Estrada is a 64 y.o. very pleasant male patient who presents with the following:  History of ulcerative colitis, prostate cancer, hyperlipidemia, CAD status post stenting, pericarditis Visit today for illness -patient location is home, my location is office.  The patient, myself and his wife are present on the call today He notes onset of sx this past Sunday- today is Wednesday He notes a headache, ST, runny nose, cough- the cough is intermittent but can be severe at times No fever His wife has the same sx  No vomiting or diarrhea  He has had his covid booster-did not have flu vaccine yet He has not yet been tested for COVID-19  He notes that his work wants him to return the office, he is not sure if this is okay  Patient Active Problem List   Diagnosis Date Noted  . Pre-op evaluation 12/01/2020  . Essential hypertension 12/01/2020  . Cervical spondylosis with myelopathy and radiculopathy 03/26/2020  . Prostate cancer ([place]) 04/12/2019  . Obese 12/01/2018  . S/P left UKR 11/30/2018  . H/O pericarditis   . Elevated troponin 11/19/2015  . Dyslipidemia, goal LDL below 70   . CAD S/P percutaneous coronary angioplasty 11/18/2015  . Chest pain 11/18/2015  . Iron deficiency anemia due to chronic blood loss 09/10/2015  . Ulcerative colitis with complication ([place]) 78/58/8502    Past Medical History:  Diagnosis Date  . Arthritis    knees, hips, hands  . Complication of anesthesia    Woke up during hip replacement, had surgery since with no problems  . Coronary artery disease    a. s/p DES to RCA  in 2008;  b. 10/2015 Cath: LM nl, LAD 50ost/p, OM1/2 min irregs, RCA 21mISR, 329m ISR, EF 65%.  . Echocardiogram    Echo 11/16: EF 60-65, normal wall motion, grade 1 diastolic dysfunction  . Grade I diastolic dysfunction 1177/41/2878 ECHO  . Hyperlipidemia   . Iron deficiency anemia due to chronic blood loss 09/10/2015  . Melanoma ([facility]   Nose  . Pericarditis    a. 10/2015->colchicine.  . Prostate cancer ([facility]  . Ulcerative colitis with complication ([facility]/6/76/7209  Past Surgical History:  Procedure Laterality Date  . ANTERIOR CERVICAL DECOMP/DISCECTOMY FUSION N/A 03/26/2020   Procedure: Cervical Four-Five, Cervical Five-Six, Cervical Six-Seven Anterior Cervical Decompression/Discectomy/Fusion;  Surgeon: CaAshok PallMD;  [facility] Service: Neurosurgery;  Laterality: N/A;  3C  . CARDIAC CATHETERIZATION N/A 11/18/2015   Procedure: Left Heart Cath and Coronary Angiography;  Surgeon: MiSherren MochaMD;  [facility];  Service: Cardiovascular;  Laterality: N/A;  . CARPAL TUNNEL RELEASE Right 03/26/2020   Procedure: CARPAL TUNNEL RELEASE;  Surgeon: CaAshok PallMD;  [facility] Service: Neurosurgery;  Laterality: Right;  . CERVICAL SPINE SURGERY    . COLONOSCOPY  2019  . CORONARY ANGIOPLASTY WITH STENT PLACEMENT  2008   a. Cordis stent to RCA in 2008 2.759m 54m5m HIP ARTHROPLASTY Right   . MOHS SURGERY    .  PARTIAL KNEE ARTHROPLASTY Left 11/30/2018   Procedure: LEFT UNICOMPARTMENTAL KNEE Medially;  Surgeon: Paralee Cancel, MD;  Location: [facility];  Service: Orthopedics;  Laterality: Left;  70 mins with block  . RADIOACTIVE SEED IMPLANT N/A 08/10/2019   Procedure: RADIOACTIVE SEED IMPLANT/BRACHYTHERAPY IMPLANT;  Surgeon: Alexis Frock, MD;  Location: [facility];  Service: Urology;  Laterality: N/A;  . SPACE OAR INSTILLATION N/A 08/10/2019   Procedure: SPACE OAR INSTILLATION;  Surgeon: Alexis Frock, MD;  Location: [facility];  Service: Urology;  Laterality: N/A;  . TONSILLECTOMY     childhood    Social History   Tobacco Use  . Smoking status: Never Smoker  . Smokeless tobacco: Never Used  Vaping Use  . Vaping Use: Never used  Substance Use Topics  . Alcohol use: Yes    Alcohol/week: 0.0 standard drinks    Comment: 1-2 drinks per week.  . Drug use: No    Family History  Problem Relation Age of Onset  . Lymphoma Mother   . Hypertension Mother   . Arthritis Mother   . Cancer Mother   . CAD Father   . Heart disease Father   . Heart attack Father   . Arthritis Sister   . Heart disease Sister   . Hypertension Brother     Allergies  Allergen Reactions  . Prednisone     Blurred vision     Medication list has been reviewed and updated.  Current Outpatient Medications on File Prior to Visit  Medication Sig Dispense Refill  . aspirin EC 81 MG tablet Take 81 mg by mouth at bedtime.     . docusate sodium (COLACE) 100 MG capsule Take 100 mg by mouth at bedtime.    Marland Kitchen loratadine (CLARITIN) 10 MG tablet Take 10 mg by mouth daily.    . meloxicam (MOBIC) 7.5 MG tablet TAKE 1 TABLET (7.5 MG TOTAL) BY MOUTH DAILY. USE AS NEEDED FOR ARTHRITIS 30 tablet 6  . mesalamine (LIALDA) 1.2 g EC tablet Take 1.2 g by mouth in the morning and at bedtime.     . methotrexate (RHEUMATREX) 2.5 MG tablet Take 6 tablets (15 mg total) by mouth once a week. Caution:Chemotherapy. Protect from light. 20 tablet 0  . metoprolol tartrate (LOPRESSOR) 25 MG tablet Take 0.5 tablets (12.5 mg total) by mouth daily. 45 tablet 3  . montelukast (SINGULAIR) 10 MG tablet Take 1 tablet (10 mg total) by mouth at bedtime. 90 tablet 3  . Multiple Vitamins-Minerals (CENTRUM SILVER PO) Take 1 tablet by mouth daily.     . nitroGLYCERIN (NITROSTAT) 0.4 MG SL tablet Place 1 tablet (0.4 mg total) under the tongue every 5 (five) minutes as needed for chest pain. 25 tablet 3  . OVER THE COUNTER MEDICATION Take 1-2 capsules by mouth at bedtime as  needed (pain). otc Hemp oil capsules    . triamcinolone ointment (KENALOG) 0.5 % Apply 1 application topically 2 (two) times daily. Use as needed for skin rash 60 g 1   No current facility-administered medications on file prior to visit.    Review of Systems:  As per HPI- otherwise negative.   Physical Examination: Vitals:   12/17/20 1055  BP: (!) 142/82  Pulse: 97  Temp: (!) 97.4 F (36.3 C)   Vitals:   12/17/20 1055  Weight: 178 lb (80.7 kg)   Body mass index is 30.55 kg/m. Ideal Body Weight:    Pt observed via video monitor- he looks well and  his normal self   Assessment and Plan: Upper respiratory tract infection, unspecified type  Pt with apparent URI- possible covid.  Connected with patient over video monitor encouraed him to self isolate and be tested asap-do not return to work until we know he is negative for Covid If negative and if continues to be ill we are glad to see him in the office as needed As sx are currently mild use OTC meds as needed  His wife plans to be tested as well, they will let me know the results   Signed Lamar Blinks, MD

## 2020-12-18 ENCOUNTER — Encounter: Payer: Self-pay | Admitting: Family Medicine

## 2021-02-27 ENCOUNTER — Encounter: Payer: Self-pay | Admitting: Family Medicine

## 2021-03-12 ENCOUNTER — Encounter: Payer: Self-pay | Admitting: Family Medicine

## 2021-03-27 ENCOUNTER — Ambulatory Visit: Payer: Self-pay | Attending: Internal Medicine

## 2021-03-27 DIAGNOSIS — Z23 Encounter for immunization: Secondary | ICD-10-CM

## 2021-03-27 NOTE — Progress Notes (Signed)
   Covid-19 Vaccination Clinic  Name:  Jakaleb Payer    MRN: [id number] DOB: [birth year]  03/27/2021  Mr. Whaling was observed post Covid-19 immunization for 15 minutes without incident. He was provided with Vaccine Information Sheet and instruction to access the V-Safe system.   Mr. Vickrey was instructed to call 911 with any severe reactions post vaccine: Marland Kitchen Difficulty breathing  . Swelling of face and throat  . A fast heartbeat  . A bad rash all over body  . Dizziness and weakness   Immunizations Administered    Name Date Dose VIS Date Route   Moderna Covid-19 Booster Vaccine 03/27/2021 12:36 PM 0.25 mL 10/08/2020 Intramuscular   Manufacturer: Levan Hurst   Lot: [id number]   [place]: [id number]

## 2021-04-03 ENCOUNTER — Other Ambulatory Visit (HOSPITAL_BASED_OUTPATIENT_CLINIC_OR_DEPARTMENT_OTHER): Payer: Self-pay

## 2021-04-03 MED ORDER — COVID-19 MRNA VACC (MODERNA) 100 MCG/0.5ML IM SUSP
INTRAMUSCULAR | 0 refills | Status: DC
  Filled 2021-04-03: qty 0.25, 1d supply, fill #0

## 2021-09-20 NOTE — Progress Notes (Addendum)
[facility] [address] [place], [place] [address] [phone]  Date:  09/23/2021   Name:  Tyler Estrada   DOB:  17-Sep-1956   MRN:  [id number]  PCP:  Darreld Mclean, MD    Chief Complaint: Edema (Pt c/o: Swelling in left ankle, Some in the right not as bad as left. Worse at night x 1 month. Tight feeling with the edema. )   History of Present Illness:  Tyler Estrada is a 65 y.o. very pleasant male patient who presents with the following:  Patient seen today with concern of ankle swelling- they left is a bit worse but both can be swollen Most recent visit with myself was a virtual visit in December Get better overnight, but swell more on as the day goes on Can be uncomfortable when swollen He has noted for a month or so He has not generally swollen in the past except for occasional mild swelling  No SOB,no orthopnea  Otherwise he is feeling well.  He is now fully retired and he and his wife bought a motor home  Wt Readings from Last 3 Encounters:  09/23/21 188 lb 3.2 oz (85.4 kg)  12/17/20 178 lb (80.7 kg)  12/04/20 178 lb (80.7 kg)     History of ulcerative colitis, prostate cancer, hyperlipidemia, CAD status post stenting 2008, pericarditis He underwent a C4-7 decompression in April of this year and has done overall quite well from that standpoint He also has osteoarthritis, using methotrexate per rheumatology Most recent visit with cardiology was in [place] had a patent catheterization 2016, has been stable  Shingrix - we discussed today.  He plans to get after flu and COVID boosters are completed Flu vaccine-give today New COVID booster-if plans to do soon Can update labs today Patient Active Problem List   Diagnosis Date Noted   Pre-op evaluation 12/01/2020   Essential hypertension 12/01/2020   Cervical spondylosis with myelopathy and radiculopathy 03/26/2020   Prostate cancer ([place])  04/12/2019   Obese 12/01/2018   S/P left UKR 11/30/2018   H/O pericarditis    Elevated troponin 11/19/2015   Dyslipidemia, goal LDL below 70    CAD S/P percutaneous coronary angioplasty 11/18/2015   Chest pain 11/18/2015   Iron deficiency anemia due to chronic blood loss 09/10/2015   Ulcerative colitis with complication ([place]) 93/73/4287    Past Medical History:  Diagnosis Date   Arthritis    knees, hips, hands   Complication of anesthesia    Woke up during hip replacement, had surgery since with no problems   Coronary artery disease    a. s/p DES to RCA in 2008;  b. 10/2015 Cath: LM nl, LAD 50ost/p, OM1/2 min irregs, RCA 31mISR, 363m ISR, EF 65%.   Echocardiogram    Echo 11/16: EF 60-65, normal wall motion, grade 1 diastolic dysfunction   Grade I diastolic dysfunction 1168/10/5725 ECHO   Hyperlipidemia    Iron deficiency anemia due to chronic blood loss 09/10/2015   Melanoma ([facility])    Nose   Pericarditis    a. 10/2015->colchicine.   Prostate cancer ([facility]   Ulcerative colitis with complication ([facility]/01/23/5596  Past Surgical History:  Procedure Laterality Date   ANTERIOR CERVICAL DECOMP/DISCECTOMY FUSION N/A 03/26/2020   Procedure: Cervical Four-Five, Cervical Five-Six, Cervical Six-Seven Anterior Cervical Decompression/Discectomy/Fusion;  Surgeon: CaAshok PallMD;  [facility] Service: Neurosurgery;  Laterality: N/A;  3C   CARDIAC CATHETERIZATION N/A 11/18/2015   Procedure: Left Heart Cath and Coronary Angiography;  Surgeon: Sherren Mocha, MD;  Location: [place] [facility];  Service: Cardiovascular;  Laterality: N/A;   CARPAL TUNNEL RELEASE Right 03/26/2020   Procedure: CARPAL TUNNEL RELEASE;  Surgeon: Ashok Pall, MD;  Location: [facility];  Service: Neurosurgery;  Laterality: Right;   CERVICAL SPINE SURGERY     COLONOSCOPY  2019   CORONARY ANGIOPLASTY WITH STENT PLACEMENT  2008   a. Cordis stent to RCA in 2008 2.59m x 126m  HIP ARTHROPLASTY Right    MOHS SURGERY      PARTIAL KNEE ARTHROPLASTY Left 11/30/2018   Procedure: LEFT UNICOMPARTMENTAL KNEE Medially;  Surgeon: OlParalee CancelMD;  [facility];  Service: Orthopedics;  Laterality: Left;  70 mins with block   RADIOACTIVE SEED IMPLANT N/A 08/10/2019   Procedure: RADIOACTIVE SEED IMPLANT/BRACHYTHERAPY IMPLANT;  Surgeon: MaAlexis FrockMD;  [facility];  Laterality: N/A;   SPACE OAR INSTILLATION N/A 08/10/2019   Procedure: SPACE OAR INSTILLATION;  Surgeon: MaAlexis FrockMD;  [facility];  Laterality: N/A;   TONSILLECTOMY     childhood    Social History   Tobacco Use   Smoking status: Never   Smokeless tobacco: Never  Vaping Use   Vaping Use: Never used  Substance Use Topics   Alcohol use: Yes    Alcohol/week: 0.0 standard drinks    Comment: 1-2 drinks per week.   Drug use: No    Family History  Problem Relation Age of Onset   Lymphoma Mother    Hypertension Mother    Arthritis Mother    Cancer Mother    CAD Father    Heart disease Father    Heart attack Father    Arthritis Sister    Heart disease Sister    Hypertension Brother     Allergies  Allergen Reactions   Prednisone     Blurred vision     Medication list has been reviewed and updated.  Current Outpatient Medications on File Prior to Visit  Medication Sig Dispense Refill   aspirin EC 81 MG tablet Take 81 mg by mouth at bedtime.      COVID-19 mRNA vaccine, Moderna, 100 MCG/0.5ML injection AS DIRECTED .25 mL 0   COVID-19 mRNA vaccine, Moderna, 100 MCG/0.5ML injection Inject into the muscle. 0.25 mL 0   docusate sodium (COLACE) 100 MG capsule Take 100 mg by mouth at bedtime.     loratadine (CLARITIN) 10 MG tablet Take 10 mg by mouth daily.     meloxicam (MOBIC) 7.5 MG tablet TAKE 1 TABLET (7.5 MG TOTAL) BY MOUTH DAILY. USE AS NEEDED FOR ARTHRITIS 30 tablet 6   mesalamine (LIALDA) 1.2 g EC tablet Take 1.2 g by mouth in the  morning and at bedtime.      methotrexate (RHEUMATREX) 2.5 MG tablet Take 6 tablets (15 mg total) by mouth once a week. Caution:Chemotherapy. Protect from light. 20 tablet 0   metoprolol tartrate (LOPRESSOR) 25 MG tablet Take 0.5 tablets (12.5 mg total) by mouth daily. 45 tablet 3   montelukast (SINGULAIR) 10 MG tablet Take 1 tablet (10 mg total) by mouth at bedtime. 90 tablet 3   Multiple Vitamins-Minerals (CENTRUM SILVER PO) Take 1 tablet by mouth daily.      nitroGLYCERIN (NITROSTAT) 0.4 MG SL tablet Place 1 tablet (0.4 mg total) under the tongue every 5 (five) minutes  as needed for chest pain. 25 tablet 3   OVER THE COUNTER MEDICATION Take 1-2 capsules by mouth at bedtime as needed (pain). otc Hemp oil capsules     triamcinolone ointment (KENALOG) 0.5 % Apply 1 application topically 2 (two) times daily. Use as needed for skin rash 60 g 1   No current facility-administered medications on file prior to visit.    Review of Systems:  As per HPI- otherwise negative. BP Readings from Last 3 Encounters:  09/23/21 (!) 145/77  12/17/20 (!) 142/82  12/04/20 120/76      Physical Examination: Vitals:   09/23/21 1054  BP: (!) 145/77  Pulse: 64  Resp: 18  Temp: 97.8 F (36.6 C)  SpO2: 99%   Vitals:   09/23/21 1054  Weight: 188 lb 3.2 oz (85.4 kg)  Height: 5' 4"  (1.626 m)   Body mass index is 32.3 kg/m. Ideal Body Weight: Weight in (lb) to have BMI = 25: 145.3  GEN: no acute distress.  Chest overweight, looks well HEENT: Atraumatic, Normocephalic.  Ears and Nose: No external deformity. CV: RRR, No M/G/R. No JVD. No thrill. No extra heart sounds. PULM: CTA B, no wheezes, crackles, rhonchi. No retractions. No resp. distress. No accessory muscle use. ABD: S, NT, ND, +BS. No rebound. No HSM. EXTR: No c/c/trace edema present at both ankles Strong pedal pulses and normal monofilament sensation bilaterally PSYCH: Normally interactive. Conversant.    Assessment and Plan: Lower  extremity edema - Plan: CBC, Comprehensive metabolic panel, B Nat Peptide, furosemide (LASIX) 20 MG tablet  Hyperlipidemia, unspecified hyperlipidemia type - Plan: Lipid panel  Screening for diabetes mellitus - Plan: Hemoglobin A1c  Anemia, unspecified type - Plan: CBC  Immunization due  Need for influenza vaccination - Plan: Flu Vaccine QUAD 6+ mos PF IM (Fluarix Quad PF)  Lower extremity edema is likely due to venous insufficiency.  Discussed conservative measures management strategies including elevation, compression socks, frequent walking.  Prescribed furosemide he can use as needed for more severe swelling-this occurs on occasion  Routine labs are pending as above  Flu vaccine  Signed Lamar Blinks, MD  Received labs as below, message to patient Results for orders placed or performed in visit on 09/23/21  CBC  Result Value Ref Range   WBC 4.7 4.0 - 10.5 K/uL   RBC 3.75 (L) 4.22 - 5.81 Mil/uL   Platelets 248.0 150.0 - 400.0 K/uL   Hemoglobin 11.4 (L) 13.0 - 17.0 g/dL   HCT 34.6 (L) 39.0 - 52.0 %   MCV 92.4 78.0 - 100.0 fl   MCHC 32.9 30.0 - 36.0 g/dL   RDW 14.6 11.5 - 15.5 %  Comprehensive metabolic panel  Result Value Ref Range   Sodium 140 135 - 145 mEq/L   Potassium 4.3 3.5 - 5.1 mEq/L   Chloride 106 96 - 112 mEq/L   CO2 27 19 - 32 mEq/L   Glucose, Bld 102 (H) 70 - 99 mg/dL   BUN 14 6 - 23 mg/dL   Creatinine, Ser 0.69 0.40 - 1.50 mg/dL   Total Bilirubin 0.4 0.2 - 1.2 mg/dL   Alkaline Phosphatase 108 39 - 117 U/L   AST 17 0 - 37 U/L   ALT 18 0 - 53 U/L   Total Protein 6.3 6.0 - 8.3 g/dL   Albumin 4.1 3.5 - 5.2 g/dL   GFR 97.61 >60.00 mL/min   Calcium 9.2 8.4 - 10.5 mg/dL  Hemoglobin A1c  Result Value Ref Range   Hgb  A1c MFr Bld 5.9 4.6 - 6.5 %  Lipid panel  Result Value Ref Range   Cholesterol 185 0 - 200 mg/dL   Triglycerides 153.0 (H) 0.0 - 149.0 mg/dL   HDL 56.20 >39.00 mg/dL   VLDL 30.6 0.0 - 40.0 mg/dL   LDL Cholesterol 98 0 - 99 mg/dL   Total  CHOL/HDL Ratio 3    NonHDL 128.39   B Nat Peptide  Result Value Ref Range   Pro B Natriuretic peptide (BNP) 65.0 0.0 - 100.0 pg/mL   The 10-year ASCVD risk score (Arnett DK, et al., 2019) is: 14.9%   Values used to calculate the score:     Age: 71 years     Sex: Male     Is Non-Hispanic African American: No     Diabetic: No     Tobacco smoker: No     Systolic Blood Pressure: 462 mmHg     Is BP treated: Yes     HDL Cholesterol: 56.2 mg/dL     Total Cholesterol: 185 mg/dL

## 2021-09-20 NOTE — Patient Instructions (Addendum)
It was good to see you again today!  I will be in touch with your labs Assuming your labs are ok, your swelling is likely due to venous insufficiency- this means the veins allow some of the fluid component of blood to lead when your legs are in a dependent position Compression socks, elevation can help! Use the diuretic- furosemide- as needed.  If you are taking this several times a week we may need to add a potassium supplement so keep me posted  Flu shot today Covid booster asap Shingles vaccine at your convenience

## 2021-09-23 ENCOUNTER — Encounter: Payer: Self-pay | Admitting: Family Medicine

## 2021-09-23 ENCOUNTER — Ambulatory Visit (INDEPENDENT_AMBULATORY_CARE_PROVIDER_SITE_OTHER): Payer: 59 | Admitting: Family Medicine

## 2021-09-23 ENCOUNTER — Other Ambulatory Visit: Payer: Self-pay

## 2021-09-23 VITALS — BP 145/77 | HR 64 | Temp 97.8°F | Resp 18 | Ht 64.0 in | Wt 188.2 lb

## 2021-09-23 DIAGNOSIS — Z23 Encounter for immunization: Secondary | ICD-10-CM | POA: Diagnosis not present

## 2021-09-23 DIAGNOSIS — R600 Localized edema: Secondary | ICD-10-CM

## 2021-09-23 DIAGNOSIS — D649 Anemia, unspecified: Secondary | ICD-10-CM | POA: Diagnosis not present

## 2021-09-23 DIAGNOSIS — E785 Hyperlipidemia, unspecified: Secondary | ICD-10-CM

## 2021-09-23 DIAGNOSIS — Z131 Encounter for screening for diabetes mellitus: Secondary | ICD-10-CM | POA: Diagnosis not present

## 2021-09-23 LAB — CBC
HCT: 34.6 % — ABNORMAL LOW (ref 39.0–52.0)
Hemoglobin: 11.4 g/dL — ABNORMAL LOW (ref 13.0–17.0)
MCHC: 32.9 g/dL (ref 30.0–36.0)
MCV: 92.4 fl (ref 78.0–100.0)
Platelets: 248 10*3/uL (ref 150.0–400.0)
RBC: 3.75 Mil/uL — ABNORMAL LOW (ref 4.22–5.81)
RDW: 14.6 % (ref 11.5–15.5)
WBC: 4.7 10*3/uL (ref 4.0–10.5)

## 2021-09-23 LAB — COMPREHENSIVE METABOLIC PANEL
ALT: 18 U/L (ref 0–53)
AST: 17 U/L (ref 0–37)
Albumin: 4.1 g/dL (ref 3.5–5.2)
Alkaline Phosphatase: 108 U/L (ref 39–117)
BUN: 14 mg/dL (ref 6–23)
CO2: 27 mEq/L (ref 19–32)
Calcium: 9.2 mg/dL (ref 8.4–10.5)
Chloride: 106 mEq/L (ref 96–112)
Creatinine, Ser: 0.69 mg/dL (ref 0.40–1.50)
GFR: 97.61 mL/min (ref >60.00)
Glucose, Bld: 102 mg/dL — ABNORMAL HIGH (ref 70–99)
Potassium: 4.3 mEq/L (ref 3.5–5.1)
Sodium: 140 mEq/L (ref 135–145)
Total Bilirubin: 0.4 mg/dL (ref 0.2–1.2)
Total Protein: 6.3 g/dL (ref 6.0–8.3)

## 2021-09-23 LAB — LIPID PANEL
Cholesterol: 185 mg/dL (ref 0–200)
HDL: 56.2 mg/dL (ref >39.00)
LDL Cholesterol: 98 mg/dL (ref 0–99)
NonHDL: 128.39
Total CHOL/HDL Ratio: 3
Triglycerides: 153 mg/dL — ABNORMAL HIGH (ref 0.0–149.0)
VLDL: 30.6 mg/dL (ref 0.0–40.0)

## 2021-09-23 LAB — BRAIN NATRIURETIC PEPTIDE: Pro B Natriuretic peptide (BNP): 65 pg/mL (ref 0.0–100.0)

## 2021-09-23 LAB — HEMOGLOBIN A1C: Hgb A1c MFr Bld: 5.9 % (ref 4.6–6.5)

## 2021-09-23 MED ORDER — FUROSEMIDE 20 MG PO TABS: 20.0000 mg | ORAL_TABLET | Freq: Every day | ORAL | 3 refills | Status: DC

## 2021-09-23 MED ORDER — ROSUVASTATIN CALCIUM 10 MG PO TABS: 10.0000 mg | ORAL_TABLET | Freq: Every day | ORAL | 3 refills | Status: DC

## 2021-09-24 ENCOUNTER — Encounter: Payer: Self-pay | Admitting: Family Medicine

## 2021-09-27 IMAGING — MR GRA SPINE^CERVICAL
5 of 6 series · 34 of 48 positions shown · non-contrast
Comparison: Plain film cervical spine 02/08/2020.

CLINICAL DATA: Neck and bilateral shoulder pain for 3-4 months.
Right arm numbness and decreased range of motion.

EXAM:
MRI CERVICAL SPINE WITHOUT CONTRAST
TECHNIQUE: Multiplanar, multisequence MR imaging of the cervical spine was
performed. No intravenous contrast was administered.

[Series 2: T2 · sagittal · 3.0mm · 0.69mm/px · 5 of 14 slices shown (1 of 3)]
[im 1/14]
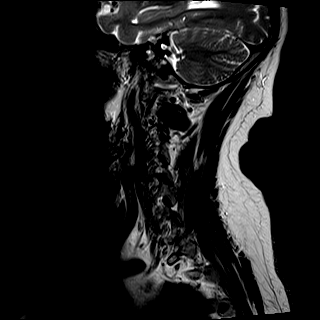
[im 4/14]
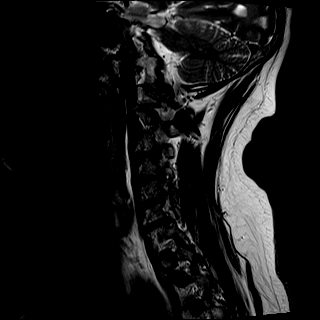
[im 7/14]
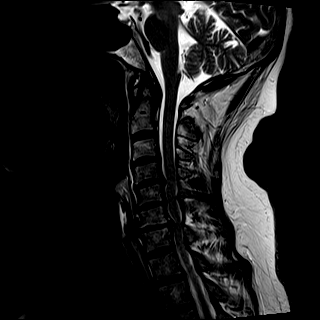
[im 10/14]
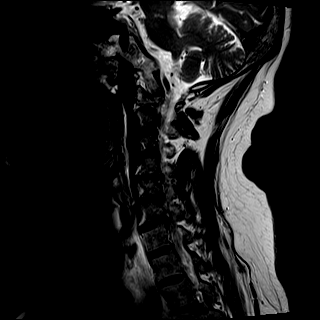
[im 14/14]
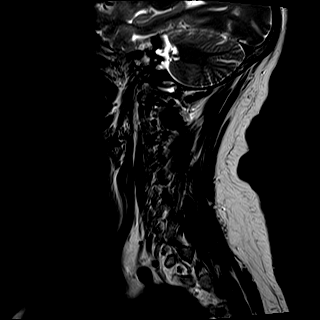

[Series 3: T1 · sagittal · 3.0mm · 0.69mm/px · 5 of 14 slices shown]
[im 1/14]
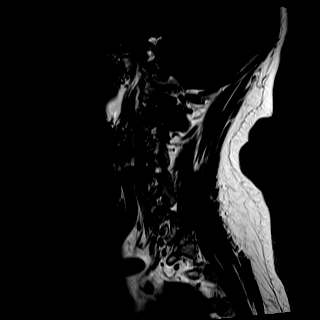
[im 4/14]
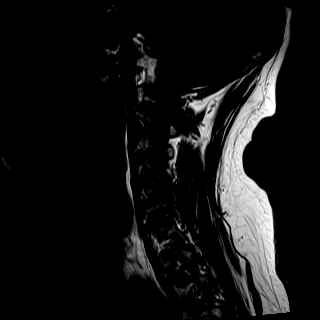
[im 7/14]
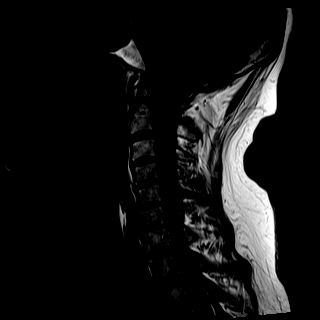
[im 10/14]
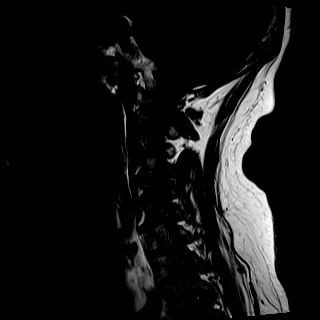
[im 14/14]
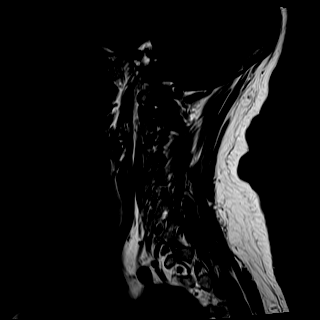

[Series 4: STIR · sagittal · 3.0mm · 0.69mm/px · 5 of 14 slices shown]
[im 1/14]
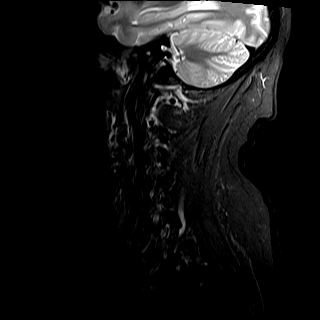
[im 4/14]
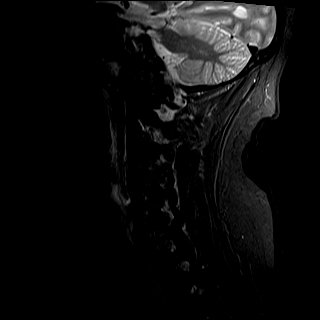
[im 7/14]
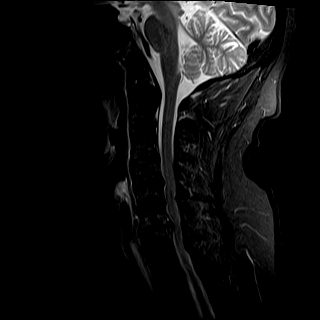
[im 10/14]
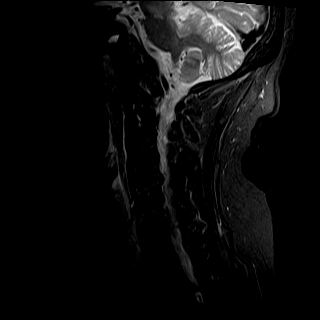
[im 14/14]
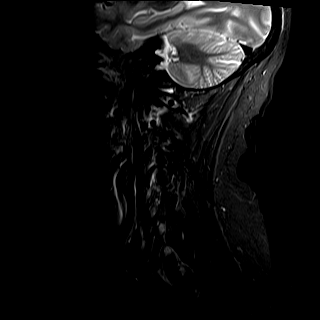

[Series 5: T2 · axial · 3.0mm · 0.62mm/px · z∈[-69,+66]mm · 11 of 40 slices shown (2 of 3)]
[im 1/40]
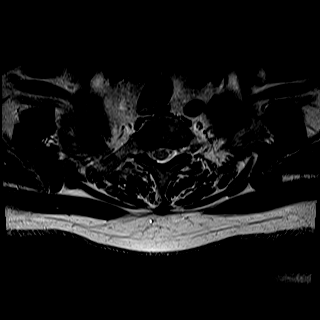
[im 4/40]
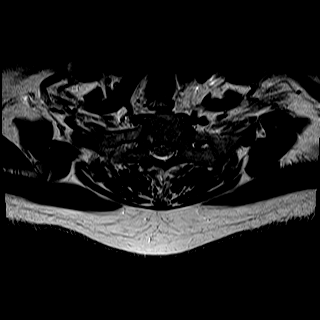
[im 7/40]
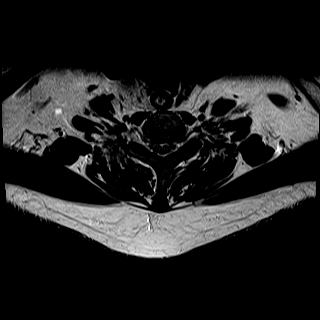
[im 10/40]
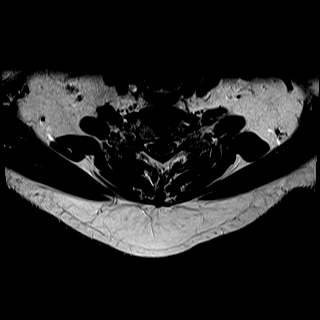
[im 13/40]
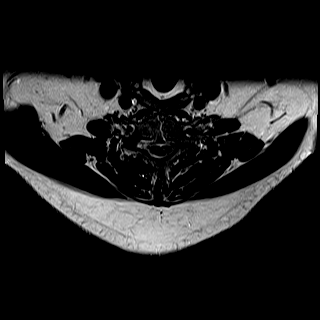
[im 16/40]
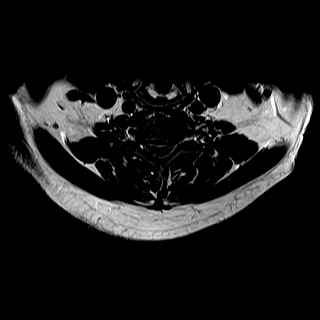
[im 19/40]
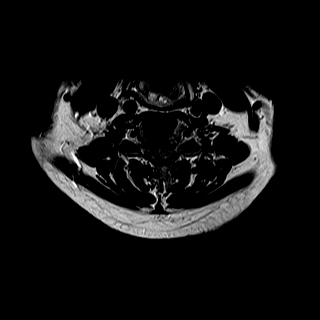
[im 22/40]
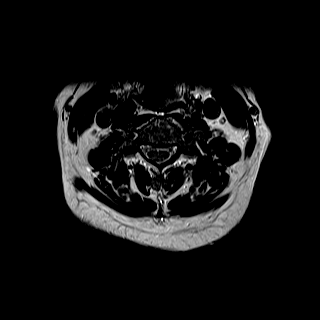
[im 28/40]
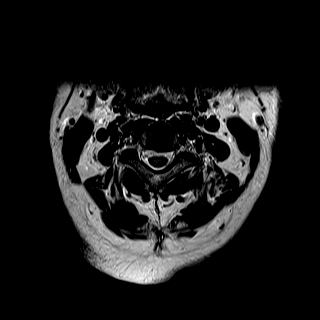
[im 34/40]
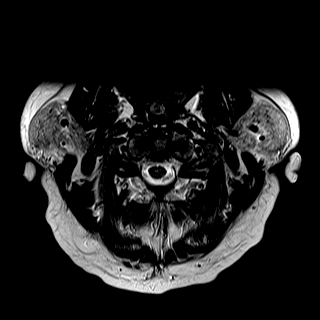
[im 40/40]
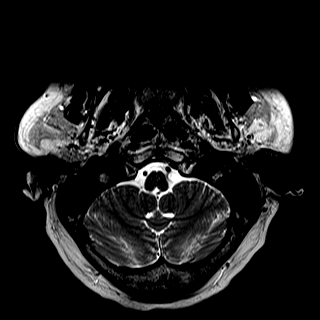

[Series 6: T2 · axial · 3.0mm · 0.39mm/px · z∈[-69,+66]mm · 8 of 40 slices shown (3 of 3)]
[im 1/40]
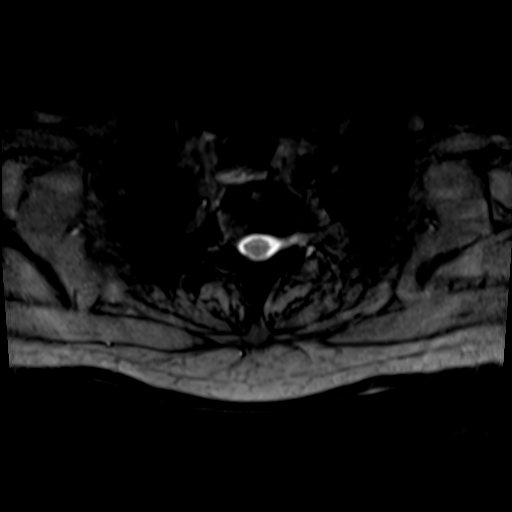
[im 7/40]
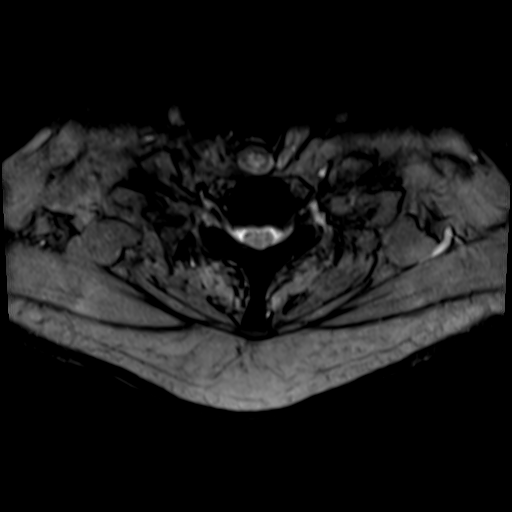
[im 13/40]
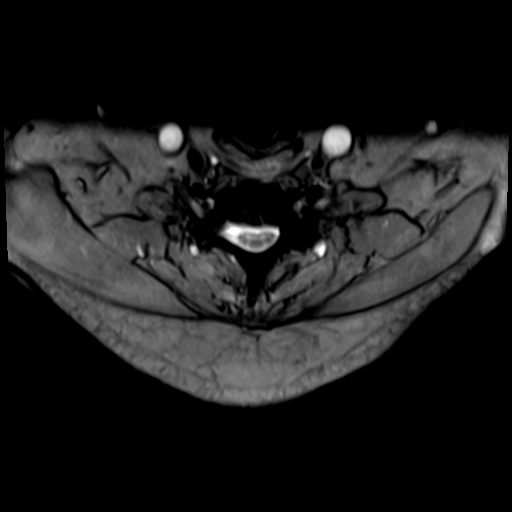
[im 19/40]
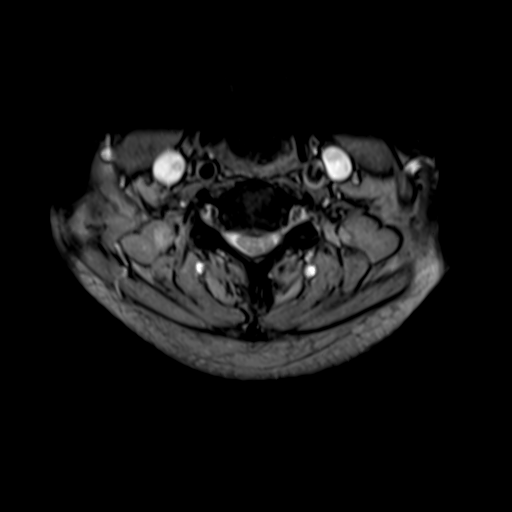
[im 22/40]
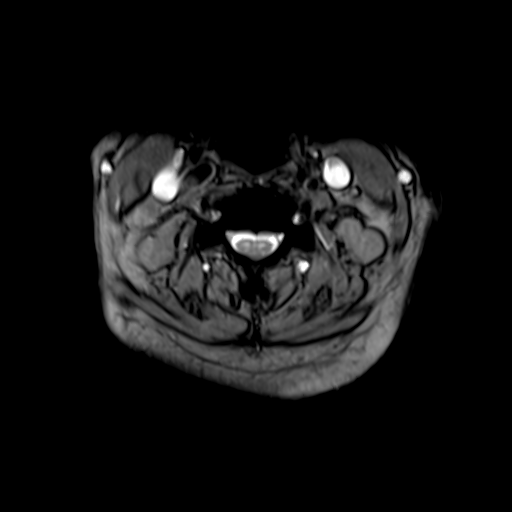
[im 28/40]
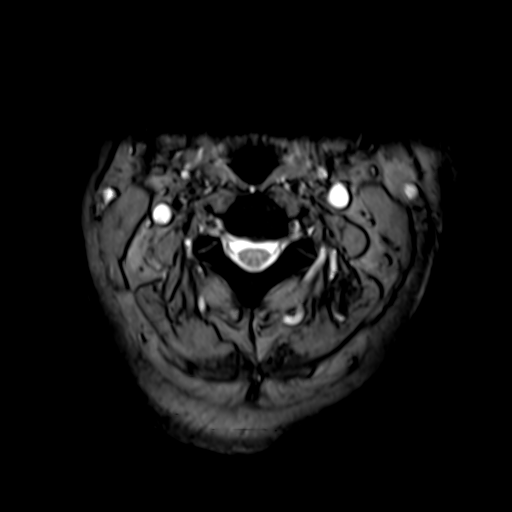
[im 34/40]
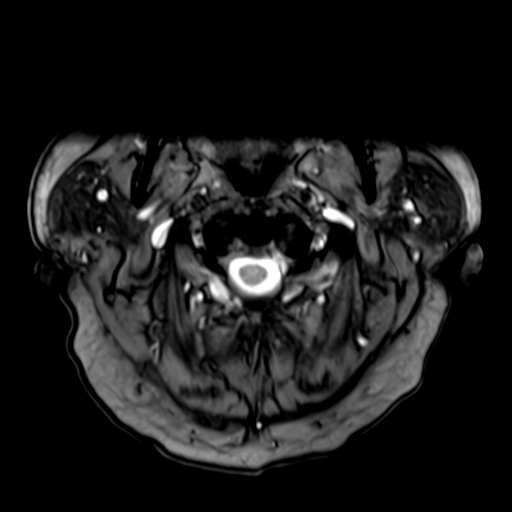
[im 40/40]
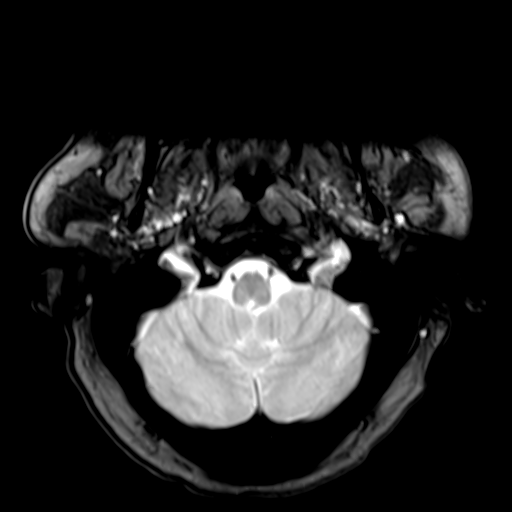

[34 of 48 positions shown; findings below may reference images not displayed]

FINDINGS: Alignment: Maintained with straightening of lordosis noted.

Vertebrae: No fracture, evidence of discitis, or bone lesion.

Cord: Normal signal throughout.

Posterior Fossa, vertebral arteries, paraspinal tissues: Negative.

Disc levels:

C2-3: Mild left foraminal narrowing due to uncovertebral disease.
The central canal and right foramen are widely patent.

C3-4: Shallow disc bulge with some uncovertebral spurring. The
central canal is open. There is mild to moderate bilateral foraminal
narrowing.

C4-5: Disc bulge and uncovertebral disease. There is flattening of
the ventral cord and marked bilateral foraminal narrowing.

C5-6: Broad-based central protrusion deforms the ventral cord, worse
on the right. Uncovertebral disease causes mild foraminal narrowing.

C6-7: Loss of disc space height with a broad-based central
protrusion which flattens the cord. Uncovertebral disease causes
moderate to moderately severe foraminal narrowing, worse on the
right.

C7-T1: Shallow disc bulge without stenosis.
IMPRESSION: Broad-based central protrusion at C6-7 flattens the cord. Moderate
to moderately severe foraminal narrowing at this level is worse on
the right.

Broad-based central protrusion at C5-6 deforms the cord, worse on
the right. Mild bilateral foraminal narrowing is present at C5-6.

Flattening of the ventral cord and severe bilateral foraminal
narrowing at C4-5 due to disc osteophyte complex and uncovertebral
spurring.

## 2021-10-28 ENCOUNTER — Ambulatory Visit: Payer: 59 | Attending: Internal Medicine

## 2021-10-28 DIAGNOSIS — Z23 Encounter for immunization: Secondary | ICD-10-CM

## 2021-10-28 NOTE — Progress Notes (Signed)
   Covid-19 Vaccination Clinic  Name:  Tyler Estrada    MRN: [id number] DOB: [birth year]  10/28/2021  [name] was observed post Covid-19 immunization for 15 minutes without incident. He was provided with Vaccine Information Sheet and instruction to access the V-Safe system.   [name] was instructed to call 911 with any severe reactions post vaccine: Difficulty breathing  Swelling of face and throat  A fast heartbeat  A bad rash all over body  Dizziness and weakness   Immunizations Administered     Name Date Dose VIS Date Route   Moderna Covid-19 vaccine Bivalent Booster 10/28/2021  9:03 AM 0.5 mL 08/01/2021 Intramuscular   Manufacturer: Levan Hurst   Lot: [id number]   [place]: [place], PharmD, MBA Clinical Acute Care Pharmacist

## 2021-11-16 ENCOUNTER — Other Ambulatory Visit (HOSPITAL_BASED_OUTPATIENT_CLINIC_OR_DEPARTMENT_OTHER): Payer: Self-pay

## 2021-11-16 MED ORDER — MODERNA COVID-19 BIVAL BOOSTER 50 MCG/0.5ML IM SUSP
INTRAMUSCULAR | 0 refills | Status: DC
  Filled 2021-11-16: qty 0.5, 1d supply, fill #0

## 2021-11-23 ENCOUNTER — Encounter: Payer: Self-pay | Admitting: Family Medicine

## 2021-12-10 ENCOUNTER — Other Ambulatory Visit: Payer: Self-pay | Admitting: Family Medicine

## 2021-12-10 DIAGNOSIS — L309 Dermatitis, unspecified: Secondary | ICD-10-CM

## 2021-12-15 DIAGNOSIS — H5213 Myopia, bilateral: Secondary | ICD-10-CM | POA: Diagnosis not present

## 2021-12-21 ENCOUNTER — Other Ambulatory Visit: Payer: Self-pay | Admitting: Family Medicine

## 2021-12-21 DIAGNOSIS — R600 Localized edema: Secondary | ICD-10-CM

## 2022-01-12 DIAGNOSIS — M0609 Rheumatoid arthritis without rheumatoid factor, multiple sites: Secondary | ICD-10-CM | POA: Diagnosis not present

## 2022-01-20 DIAGNOSIS — L570 Actinic keratosis: Secondary | ICD-10-CM | POA: Diagnosis not present

## 2022-01-20 DIAGNOSIS — Z85828 Personal history of other malignant neoplasm of skin: Secondary | ICD-10-CM | POA: Diagnosis not present

## 2022-01-20 DIAGNOSIS — L280 Lichen simplex chronicus: Secondary | ICD-10-CM | POA: Diagnosis not present

## 2022-01-24 NOTE — Progress Notes (Addendum)
[facility] [place], [place], [place] [address] [phone]  Date:  01/25/2022   Name:  Tyler Estrada   DOB:  [birth year]   MRN:  [id number]  PCP:  Darreld Mclean, MD    Chief Complaint: Shortness of Breath (DOE on exertion x 2 weeks or so. )   History of Present Illness:  Tyler Estrada is a 66 y.o. very pleasant male patient who presents with the following:  Patient seen today with concern of dyspnea on exertion Most recent visit with myself was in October history of hypertension, prediabetes, hyperlipidemia, sleep apnea treated with oral appliance, vitamin D deficiency He underwent a C4-7 decompression in April of 2022 and has done overall quite well from that standpoint He also has osteoarthritis, using methotrexate per rheumatology He is followed up regularly by cardiology -he had a patent catheterization 2016, has been stable. I believe last visit with them however was in December 2021  His rheumatologist is Leafy Kindle He sees Dr Tresa Moore for urology for his history of prostate cancer   Pneumonia vaccination- defer today due to sx  Shingles vaccine COVID is up-to-date Lab work done in October  Pt has noted DOE for about 2 weeks He is not SOB at rest No CP He notes sx with really any physical activity such as walking He does not go up a lot of stairs Trying to do something like carrying laundry tires him out a lot He has not noted a cough per se, but he may cough on occasion No wheezing No history of asthma No hemoptysis No orthopnea  No recent leg pain or swelling, no calf pain No recent covid or illness that he is aware of  Patient Active Problem List   Diagnosis Date Noted   Pre-op evaluation 12/01/2020   Essential hypertension 12/01/2020   Cervical spondylosis with myelopathy and radiculopathy 03/26/2020   Prostate cancer ([place]) 04/12/2019   Obese 12/01/2018   S/P left UKR 11/30/2018    H/O pericarditis    Elevated troponin 11/19/2015   Dyslipidemia, goal LDL below 70    CAD S/P percutaneous coronary angioplasty 11/18/2015   Chest pain 11/18/2015   Iron deficiency anemia due to chronic blood loss 09/10/2015   Ulcerative colitis with complication ([place]) 36/64/4034    Past Medical History:  Diagnosis Date   Arthritis    knees, hips, hands   Complication of anesthesia    Woke up during hip replacement, had surgery since with no problems   Coronary artery disease    a. s/p DES to RCA in 2008;  b. 10/2015 Cath: LM nl, LAD 50ost/p, OM1/2 min irregs, RCA 52mISR, 323m ISR, EF 65%.   Echocardiogram    Echo 11/16: EF 60-65, normal wall motion, grade 1 diastolic dysfunction   Grade I diastolic dysfunction 1174/25/9563 ECHO   Hyperlipidemia    Iron deficiency anemia due to chronic blood loss 09/10/2015   Melanoma (HCC)    Nose   Pericarditis    a. 10/2015->colchicine.   Prostate cancer ([facility]   Ulcerative colitis with complication ([facility]/8/75/6433  Past Surgical History:  Procedure Laterality Date   ANTERIOR CERVICAL DECOMP/DISCECTOMY FUSION N/A 03/26/2020   Procedure: Cervical Four-Five, Cervical Five-Six, Cervical Six-Seven Anterior Cervical Decompression/Discectomy/Fusion;  Surgeon: CaAshok PallMD;  [facility]: [facility]surgery;  Laterality: N/A;  3C   CARDIAC CATHETERIZATION N/A 11/18/2015   Procedure: Left Heart  Cath and Coronary Angiography;  Surgeon: Sherren Mocha, MD;  Location: [facility];  Service: Cardiovascular;  Laterality: N/A;   CARPAL TUNNEL RELEASE Right 03/26/2020   Procedure: CARPAL TUNNEL RELEASE;  Surgeon: Ashok Pall, MD;  Location: [facility];  Service: Neurosurgery;  Laterality: Right;   CERVICAL SPINE SURGERY     COLONOSCOPY  2019   CORONARY ANGIOPLASTY WITH STENT PLACEMENT  2008   a. Cordis stent to RCA in 2008 2.74m x 139m  HIP ARTHROPLASTY Right    MOHS SURGERY     PARTIAL KNEE ARTHROPLASTY Left 11/30/2018    Procedure: LEFT UNICOMPARTMENTAL KNEE Medially;  Surgeon: OlParalee CancelMD;  [facility];  Service: Orthopedics;  Laterality: Left;  70 mins with block   RADIOACTIVE SEED IMPLANT N/A 08/10/2019   Procedure: RADIOACTIVE SEED IMPLANT/BRACHYTHERAPY IMPLANT;  Surgeon: MaAlexis FrockMD;  [facility];  Laterality: N/A;   SPACE OAR INSTILLATION N/A 08/10/2019   Procedure: SPACE OAR INSTILLATION;  Surgeon: MaAlexis FrockMD;  [facility];  Laterality: N/A;   TONSILLECTOMY     childhood    Social History   Tobacco Use   Smoking status: Never   Smokeless tobacco: Never  Vaping Use   Vaping Use: Never used  Substance Use Topics   Alcohol use: Yes    Alcohol/week: 0.0 standard drinks    Comment: 1-2 drinks per week.   Drug use: No    Family History  Problem Relation Age of Onset   Lymphoma Mother    Hypertension Mother    Arthritis Mother    Cancer Mother    CAD Father    Heart disease Father    Heart attack Father    Arthritis Sister    Heart disease Sister    Hypertension Brother     Allergies  Allergen Reactions   Prednisone     Blurred vision     Medication list has been reviewed and updated.  Current Outpatient Medications on File Prior to Visit  Medication Sig Dispense Refill   aspirin EC 81 MG tablet Take 81 mg by mouth at bedtime.      COVID-19 mRNA bivalent vaccine, Moderna, (MODERNA COVID-19 BIVAL BOOSTER) 50 MCG/0.5ML injection Inject into the muscle. 0.5 mL 0   COVID-19 mRNA vaccine, Moderna, 100 MCG/0.5ML injection Inject into the muscle. 0.25 mL 0   docusate sodium (COLACE) 100 MG capsule Take 100 mg by mouth at bedtime.     furosemide (LASIX) 20 MG tablet TAKE ONE TABLET BY MOUTH DAILY; USE AS NEEDED FOR SWELLING OF LEGS 90 tablet 0   loratadine (CLARITIN) 10 MG tablet Take 10 mg by mouth daily.     meloxicam (MOBIC) 7.5 MG tablet TAKE 1 TABLET (7.5 MG TOTAL) BY  MOUTH DAILY. USE AS NEEDED FOR ARTHRITIS 30 tablet 6   mesalamine (LIALDA) 1.2 g EC tablet Take 1.2 g by mouth in the morning and at bedtime.      methotrexate (RHEUMATREX) 2.5 MG tablet Take 6 tablets (15 mg total) by mouth once a week. Caution:Chemotherapy. Protect from light. 20 tablet 0   metoprolol tartrate (LOPRESSOR) 25 MG tablet Take 0.5 tablets (12.5 mg total) by mouth daily. 45 tablet 3   montelukast (SINGULAIR) 10 MG tablet TAKE ONE TABLET BY MOUTH EVERY NIGHT AT BEDTIME 90 tablet 3   Multiple Vitamins-Minerals (CENTRUM SILVER PO) Take 1 tablet by mouth daily.      nitroGLYCERIN (NITROSTAT)  0.4 MG SL tablet Place 1 tablet (0.4 mg total) under the tongue every 5 (five) minutes as needed for chest pain. 25 tablet 3   OVER THE COUNTER MEDICATION Take 1-2 capsules by mouth at bedtime as needed (pain). otc Hemp oil capsules     rosuvastatin (CRESTOR) 10 MG tablet Take 1 tablet (10 mg total) by mouth daily. 90 tablet 3   triamcinolone ointment (KENALOG) 0.5 % Apply 1 application topically 2 (two) times daily. Use as needed for skin rash 60 g 1   No current facility-administered medications on file prior to visit.    Review of Systems:  As per HPI- otherwise negative.   Physical Examination: Vitals:   01/25/22 1506  BP: 124/72  Pulse: 70  Resp: 18  Temp: 97.8 F (36.6 C)  SpO2: 99%   Vitals:   01/25/22 1506  Weight: 192 lb (87.1 kg)  Height: 5' 4"  (1.626 m)   Body mass index is 32.96 kg/m. Ideal Body Weight: Weight in (lb) to have BMI = 25: 145.3  GEN: no acute distress. Obese, looks well  HEENT: Atraumatic, Normocephalic.  Ears and Nose: No external deformity. CV: RRR, No M/G/R. No JVD. No thrill. No extra heart sounds. PULM: CTA B, no wheezes, crackles, rhonchi. No retractions. No resp. distress. No accessory muscle use. ABD: S, NT, ND, +BS. No rebound. No HSM. EXTR: No c/c/e PSYCH: Normally interactive. Conversant.   EKG: Sinus rhythm with rate 69, but voltage  precordial leads.  Upon comparison with EKG December 2021 no significant changes noted Assessment and Plan: SOB (shortness of breath) - Plan: EKG 12-Lead, DG Chest 2 View, CBC, Comprehensive metabolic panel, TSH, D-Dimer, Quantitative, Troponin I -, CANCELED: Troponin I (High Sensitivity)  DOE (dyspnea on exertion) - Plan: EKG 12-Lead, DG Chest 2 View, CBC, Comprehensive metabolic panel, TSH, D-Dimer, Quantitative, Troponin I -, CANCELED: Troponin I (High Sensitivity)  Patient here today with concern of shortness of breath/dyspnea on exertion for about 2 weeks.  No chest pain noted. EKG, vitals look good We will obtain chest x-ray, lab work as above Discussed D-dimer with patient, if positive will do further evaluation for pulmonary embolism If all work-up negative, will likely pursue further cardiac work-up He is reminded to please seek care right away if his symptoms should worsen or if he does develop any chest pain  Signed Lamar Blinks, MD  Received patient x-ray and labs as below-message sent to patient regarding x-ray.  Await troponin Results for orders placed or performed in visit on 01/25/22  D-Dimer, Quantitative  Result Value Ref Range   D-Dimer, Quant 0.21 <0.50 mcg/mL FEU   DG Chest 2 View  Result Date: 01/25/2022 CLINICAL DATA:  Shortness of breath for 1 week EXAM: CHEST - 2 VIEW COMPARISON:  07/12/2019 FINDINGS: Normal heart size, mediastinal contours, and pulmonary vascularity. Lungs clear. No pulmonary infiltrate, pleural effusion, or pneumothorax. Interval cervical spine fusion with mild persistent dextroconvex thoracic scoliosis. IMPRESSION: No acute abnormalities. Electronically Signed   By: Lavonia Dana M.D.   On: 01/25/2022 16:02    Addnd 2/7- received the rest of his lab results as follows Have reached out to cardiology to ask them to schedule follow-up Mild anemia since 2019 Colon done 12/21 Results for orders placed or performed in visit on 01/25/22  CBC   Result Value Ref Range   WBC 4.4 4.0 - 10.5 K/uL   RBC 3.83 (L) 4.22 - 5.81 Mil/uL   Platelets 230.0 150.0 - 400.0 K/uL  Hemoglobin 11.6 (L) 13.0 - 17.0 g/dL   HCT 35.1 (L) 39.0 - 52.0 %   MCV 91.6 78.0 - 100.0 fl   MCHC 33.0 30.0 - 36.0 g/dL   RDW 14.6 11.5 - 15.5 %  Comprehensive metabolic panel  Result Value Ref Range   Sodium 139 135 - 145 mEq/L   Potassium 4.0 3.5 - 5.1 mEq/L   Chloride 106 96 - 112 mEq/L   CO2 26 19 - 32 mEq/L   Glucose, Bld 95 70 - 99 mg/dL   BUN 18 6 - 23 mg/dL   Creatinine, Ser 0.75 0.40 - 1.50 mg/dL   Total Bilirubin 0.6 0.2 - 1.2 mg/dL   Alkaline Phosphatase 88 39 - 117 U/L   AST 17 0 - 37 U/L   ALT 15 0 - 53 U/L   Total Protein 6.7 6.0 - 8.3 g/dL   Albumin 4.5 3.5 - 5.2 g/dL   GFR 94.95 >60.00 mL/min   Calcium 9.5 8.4 - 10.5 mg/dL  TSH  Result Value Ref Range   TSH 1.73 0.35 - 5.50 uIU/mL  D-Dimer, Quantitative  Result Value Ref Range   D-Dimer, Quant 0.21 <0.50 mcg/mL FEU  Troponin I -  Result Value Ref Range   Troponin I <3 < OR = 47 ng/L

## 2022-01-25 ENCOUNTER — Encounter: Payer: Self-pay | Admitting: Family Medicine

## 2022-01-25 ENCOUNTER — Ambulatory Visit (INDEPENDENT_AMBULATORY_CARE_PROVIDER_SITE_OTHER): Payer: Medicare HMO | Admitting: Family Medicine

## 2022-01-25 ENCOUNTER — Ambulatory Visit (HOSPITAL_BASED_OUTPATIENT_CLINIC_OR_DEPARTMENT_OTHER)
Admission: RE | Admit: 2022-01-25 | Discharge: 2022-01-25 | Disposition: A | Discharge status: Home / Self Care | Payer: Medicare HMO | Source: Ambulatory Visit | Attending: Family Medicine | Admitting: Family Medicine

## 2022-01-25 ENCOUNTER — Other Ambulatory Visit: Payer: Self-pay

## 2022-01-25 VITALS — BP 124/72 | HR 70 | Temp 97.8°F | Resp 18 | Ht 64.0 in | Wt 192.0 lb

## 2022-01-25 DIAGNOSIS — R0609 Other forms of dyspnea: Secondary | ICD-10-CM

## 2022-01-25 DIAGNOSIS — R0602 Shortness of breath: Secondary | ICD-10-CM | POA: Diagnosis not present

## 2022-01-25 LAB — D-DIMER, QUANTITATIVE: D-Dimer, Quant: 0.21 mcg/mL FEU (ref <0.50)

## 2022-01-25 LAB — TROPONIN I: Troponin I: <3 ng/L (ref <=47)

## 2022-01-25 NOTE — Patient Instructions (Signed)
It was good to see you today- please go to lab and then to the ground floor for your x-ray We will then plan the next step

## 2022-01-26 ENCOUNTER — Encounter: Payer: Self-pay | Admitting: Family Medicine

## 2022-01-26 LAB — COMPREHENSIVE METABOLIC PANEL
ALT: 15 U/L (ref 0–53)
AST: 17 U/L (ref 0–37)
Albumin: 4.5 g/dL (ref 3.5–5.2)
Alkaline Phosphatase: 88 U/L (ref 39–117)
BUN: 18 mg/dL (ref 6–23)
CO2: 26 mEq/L (ref 19–32)
Calcium: 9.5 mg/dL (ref 8.4–10.5)
Chloride: 106 mEq/L (ref 96–112)
Creatinine, Ser: 0.75 mg/dL (ref 0.40–1.50)
GFR: 94.95 mL/min (ref >60.00)
Glucose, Bld: 95 mg/dL (ref 70–99)
Potassium: 4 mEq/L (ref 3.5–5.1)
Sodium: 139 mEq/L (ref 135–145)
Total Bilirubin: 0.6 mg/dL (ref 0.2–1.2)
Total Protein: 6.7 g/dL (ref 6.0–8.3)

## 2022-01-26 LAB — CBC
HCT: 35.1 % — ABNORMAL LOW (ref 39.0–52.0)
Hemoglobin: 11.6 g/dL — ABNORMAL LOW (ref 13.0–17.0)
MCHC: 33 g/dL (ref 30.0–36.0)
MCV: 91.6 fl (ref 78.0–100.0)
Platelets: 230 10*3/uL (ref 150.0–400.0)
RBC: 3.83 Mil/uL — ABNORMAL LOW (ref 4.22–5.81)
RDW: 14.6 % (ref 11.5–15.5)
WBC: 4.4 10*3/uL (ref 4.0–10.5)

## 2022-01-26 LAB — TSH: TSH: 1.73 u[IU]/mL (ref 0.35–5.50)

## 2022-01-27 ENCOUNTER — Telehealth: Payer: Self-pay | Admitting: *Deleted

## 2022-01-27 NOTE — Telephone Encounter (Signed)
-----   Message from [facility] sent at 01/27/2022  9:19 AM EST ----- Good morning,  The patient is scheduled for 02/10/22 at 1:15pm with Ermalinda Barrios ----- Message ----- From: Nuala Alpha, LPN Sent: 05/20/9508   8:20 AM EST To: Gwendel Hanson, Stephani Police, RN, #  Per Dr. Harrington Challenger and as mentioned below, this pt needs to have a follow-up visit scheduled for sometime soon with her or an APP, for follow-up of DOE noted at his PCP office recently.  Can you please call the pt and arrange this appt, then shoot triage the date?  Thanks for all you do, Triage    ----- Message ----- From: Fay Records, MD Sent: 01/27/2022   1:33 AM EST To: Rebeca Alert [address] Triage  Please see note below by Denny Peon   PT with DOE   Needs appt in cardiology for f/u   ----- Message ----- From: Darreld Mclean, MD Sent: 01/26/2022   8:12 AM EST To: Fay Records, MD  Hi Nevin Bloodgood I hope you are doing well!  I saw Tim yesterday with complaint of dyspnea on exertion.  EKG, D-dimer, chest x-ray, troponin all negative  Could be a lung issue, but given his history of CAD I thought he should see you guys first.  Would you be so kind as to get him scheduled?  It looks like he was last seen in December 2021  Thank you! [place]

## 2022-02-02 NOTE — Progress Notes (Signed)
Cardiology Office Note    Date:  02/10/2022   ID:  Jonn Chaikin, DOB 1956-10-18, MRN [id number]   PCP:  Darreld Mclean, MD   [place]  Cardiologist:  Dorris Carnes, MD   Advanced Practice Provider:  No care team member to display Electrophysiologist:  None   [id number]}   Chief Complaint  Patient presents with   Shortness of Breath    History of Present Illness:  Tyler Estrada is a 66 y.o. male with a hx of CAD, status post RCA stenting in 2008.  Catheterization in 2016 showed patent stents in the RCA no other significant coronary disease.  Echocardiogram in 2016 showed preserved LV function.  Other medical history includes a history of remote pericarditis, treated hypertension, treated dyslipidemia, and a history of ulcerative colitis.   Last seen 11/2020 for preop clearance for colonoscopy.  Patient comes in with new DOE for the past 4 months. Walking in here caused it. No chest tightness into jaw like when he had stents. Saw Dr. Lorelei Pont, EKG CXR troponin, D dimer. was going to the [facility] until a couple months ago since taking care of his wife who broke her arm. He also gets dizzy if he bends over and stands up quickly. Not using lasix. Not getting extra salt. Prostate CA 3 yrs ago treated with seed implant and radiation.     Past Medical History:  Diagnosis Date   Arthritis    knees, hips, hands   Complication of anesthesia    Woke up during hip replacement, had surgery since with no problems   Coronary artery disease    a. s/p DES to RCA in 2008;  b. 10/2015 Cath: LM nl, LAD 50ost/p, OM1/2 min irregs, RCA 61mISR, 338m ISR, EF 65%.   Echocardiogram    Echo 11/16: EF 60-65, normal wall motion, grade 1 diastolic dysfunction   Grade I diastolic dysfunction 1147/82/9562 ECHO   Hyperlipidemia    Iron deficiency anemia due to chronic blood loss 09/10/2015   Melanoma (HCC)    Nose   Pericarditis    a. 10/2015->colchicine.   Prostate cancer  ([facility]   Ulcerative colitis with complication ([facility]9/01/19/8656  Past Surgical History:  Procedure Laterality Date   ANTERIOR CERVICAL DECOMP/DISCECTOMY FUSION N/A 03/26/2020   Procedure: Cervical Four-Five, Cervical Five-Six, Cervical Six-Seven Anterior Cervical Decompression/Discectomy/Fusion;  Surgeon: CaAshok PallMD;  [facility]: [facility];  Laterality: N/A;  3C   CARDIAC CATHETERIZATION N/A 11/18/2015   Procedure: Left Heart Cath and Coronary Angiography;  Surgeon: MiSherren MochaMD;  [facility];  Service: Cardiovascular;  Laterality: N/A;   CARPAL TUNNEL RELEASE Right 03/26/2020   Procedure: CARPAL TUNNEL RELEASE;  Surgeon: CaAshok PallMD;  [facility]: Neurosurgery;  Laterality: Right;   CERVICAL SPINE SURGERY     COLONOSCOPY  2019   CORONARY ANGIOPLASTY WITH STENT PLACEMENT  2008   a. Cordis stent to RCA in 2008 2.7542m 80m44mHIP ARTHROPLASTY Right    MOHS SURGERY     PARTIAL KNEE ARTHROPLASTY Left 11/30/2018   Procedure: LEFT UNICOMPARTMENTAL KNEE Medially;  Surgeon: OlinParalee Cancel;  [facility];  Service: Orthopedics;  Laterality: Left;  70 mins with block   RADIOACTIVE SEED IMPLANT N/A 08/10/2019   Procedure: RADIOACTIVE SEED IMPLANT/BRACHYTHERAPY IMPLANT;  Surgeon: MannAlexis Frock;  [facility];  Laterality: N/A;   SPACE  OAR INSTILLATION N/A 08/10/2019   Procedure: SPACE OAR INSTILLATION;  Surgeon: Alexis Frock, MD;  Location: [facility];  Service: Urology;  Laterality: N/A;   TONSILLECTOMY     childhood    Current Medications: Current Meds  Medication Sig   aspirin EC 81 MG tablet Take 81 mg by mouth at bedtime.    colchicine 0.6 MG tablet Take by mouth as needed.   COVID-19 mRNA bivalent vaccine, Moderna, (MODERNA COVID-19 BIVAL BOOSTER) 50 MCG/0.5ML injection Inject into the muscle.   COVID-19 mRNA vaccine, Moderna, 100 MCG/0.5ML injection  Inject into the muscle.   furosemide (LASIX) 20 MG tablet TAKE ONE TABLET BY MOUTH DAILY; USE AS NEEDED FOR SWELLING OF LEGS   loratadine (CLARITIN) 10 MG tablet Take 10 mg by mouth daily.   meloxicam (MOBIC) 7.5 MG tablet TAKE 1 TABLET (7.5 MG TOTAL) BY MOUTH DAILY. USE AS NEEDED FOR ARTHRITIS   mesalamine (LIALDA) 1.2 g EC tablet Take 1.2 g by mouth in the morning and at bedtime.    methotrexate (RHEUMATREX) 2.5 MG tablet Take 6 tablets (15 mg total) by mouth once a week. Caution:Chemotherapy. Protect from light.   metoprolol tartrate (LOPRESSOR) 25 MG tablet Take 0.5 tablets (12.5 mg total) by mouth daily.   montelukast (SINGULAIR) 10 MG tablet TAKE ONE TABLET BY MOUTH EVERY NIGHT AT BEDTIME   Multiple Vitamins-Minerals (CENTRUM SILVER PO) Take 1 tablet by mouth daily.    nitroGLYCERIN (NITROSTAT) 0.4 MG SL tablet Place 1 tablet (0.4 mg total) under the tongue every 5 (five) minutes as needed for chest pain.   OVER THE COUNTER MEDICATION Take 1-2 capsules by mouth at bedtime as needed (pain). otc Hemp oil capsules   triamcinolone ointment (KENALOG) 0.5 % Apply 1 application topically 2 (two) times daily. Use as needed for skin rash   [DISCONTINUED] rosuvastatin (CRESTOR) 10 MG tablet Take 1 tablet (10 mg total) by mouth daily.     Allergies:   Prednisone   Social History   Socioeconomic History   Marital status: Married    Spouse name: Not on file   Number of children: 0   Years of education: Not on file   Highest education level: Not on file  Occupational History    Comment: full time  Tobacco Use   Smoking status: Never   Smokeless tobacco: Never  Vaping Use   Vaping Use: Never used  Substance and Sexual Activity   Alcohol use: Yes    Alcohol/week: 0.0 standard drinks    Comment: 1-2 drinks per week.   Drug use: No   Sexual activity: Not on file  Other Topics Concern   Not on file  Social History Narrative   Not on file   Social Determinants of Health   Financial  Resource Strain: Not on file  Food Insecurity: Not on file  Transportation Needs: Not on file  Physical Activity: Not on file  Stress: Not on file  Social Connections: Not on file     Family History:  The patient's  family history includes Arthritis in his mother and sister; CAD in his father; Cancer in his mother; Heart attack in his father; Heart disease in his father and sister; Hypertension in his brother and mother; Lymphoma in his mother.   ROS:   Please see the history of present illness.    ROS All other systems reviewed and are negative.   PHYSICAL EXAM:   VS:  BP 126/76    Pulse 74  Ht 5' 4"  (1.626 m)    Wt 189 lb 6.4 oz (85.9 kg)    SpO2 98%    BMI 32.51 kg/m   Physical Exam  GEN: Obese, in no acute distress  Neck: no JVD, carotid bruits, or masses Cardiac:RRR; no murmurs, rubs, or gallops  Respiratory:  clear to auscultation bilaterally, normal work of breathing GI: soft, nontender, nondistended, + BS Ext: without cyanosis, clubbing, or edema, Good distal pulses bilaterally Neuro:  Alert and Oriented x 3 Psych: euthymic mood, full affect  Wt Readings from Last 3 Encounters:  02/10/22 189 lb 6.4 oz (85.9 kg)  01/25/22 192 lb (87.1 kg)  09/23/21 188 lb 3.2 oz (85.4 kg)      Studies/Labs Reviewed:   EKG:  EKG is not ordered today.  The ekg reviewed from 01/25/22 and normal.  Recent Labs: 09/23/2021: Pro B Natriuretic peptide (BNP) 65.0 01/25/2022: ALT 15; BUN 18; Creatinine, Ser 0.75; Hemoglobin 11.6; Platelets 230.0; Potassium 4.0; Sodium 139; TSH 1.73   Lipid Panel    Component Value Date/Time   CHOL 185 09/23/2021 1138   CHOL 158 11/12/2019 1527   TRIG 153.0 (H) 09/23/2021 1138   HDL 56.20 09/23/2021 1138   HDL 52 11/12/2019 1527   CHOLHDL 3 09/23/2021 1138   VLDL 30.6 09/23/2021 1138   LDLCALC 98 09/23/2021 1138   LDLCALC 106 (H) 10/13/2020 0908    Additional studies/ records that were reviewed today include:   Cath- 11/18/2015- 1. Patent stents in  the RCA with mild in-stent restenosis 2. Moderate proximal LAD stenosis, do not suspect 'flow-limiting' lesion 3. Widely patent and large left circumflex without stenosis 4. Vigorous LV systolic function with normal LVEDP   Risk Assessment/Calculations:         ASSESSMENT:    1. Dyspnea on exertion   2. Coronary artery disease involving native coronary artery of native heart without angina pectoris   3. Essential hypertension   4. Hyperlipidemia, unspecified hyperlipidemia type   5. Dyslipidemia      PLAN:  In order of problems listed above:  DOE for 4 months at which time he stopped exercising because of taking care of his wife. EKG, CXR, D dimer and labs ok by Dr. Dewitt Hoes order echo and exercise myoview. If they are normal symptoms could be due to deconditioning and will have to begin exercise program.  CAD DES RCA 2008, patent cath 2016 no angina like prior angina   HTN controlled  HLD LDL 98 09/2021-goal < 70. Will increase crestor 20 mg daily repeat FLP 3 months  Shared Decision Making/Informed Consent   Shared Decision Making/Informed Consent The risks [chest pain, shortness of breath, cardiac arrhythmias, dizziness, blood pressure fluctuations, myocardial infarction, stroke/transient ischemic attack, nausea, vomiting, allergic reaction, radiation exposure, metallic taste sensation and life-threatening complications (estimated to be 1 in 10,000)], benefits (risk stratification, diagnosing coronary artery disease, treatment guidance) and alternatives of a nuclear stress test were discussed in detail with Mr. Carchi and he agrees to proceed.    Medication Adjustments/Labs and Tests Ordered: Current medicines are reviewed at length with the patient today.  Concerns regarding medicines are outlined above.  Medication changes, Labs and Tests ordered today are listed in the Patient Instructions below. Patient Instructions  Medication Instructions:  Your physician has  recommended you make the following change in your medication:   1) Increase Rosuvastatin to 16m daily  *If you need a refill on your cardiac medications before your next appointment, please call your pharmacy*  Lab Work: Your physician recommends that you return for a FASTING lipid profile in 3 months.   If you have labs (blood work) drawn today and your tests are completely normal, you will receive your results only by: [place] (if you have MyChart) OR A paper copy in the mail If you have any lab test that is abnormal or we need to change your treatment, we will call you to review the results.   Testing/Procedures: Your physician has requested that you have an echocardiogram. Echocardiography is a painless test that uses sound waves to create images of your heart. It provides your doctor with information about the size and shape of your heart and how well your hearts chambers and valves are working. This procedure takes approximately one hour. There are no restrictions for this procedure.  Your physician has requested that you have en exercise stress myoview. For further information please visit [email or web address]. Please follow instruction sheet, as given.    Follow-Up: At [facility], you and your health needs are our priority.  As part of our continuing mission to provide you with exceptional heart care, we have created designated Provider Care Teams.  These Care Teams include your primary Cardiologist (physician) and Advanced Practice Providers (APPs -  Physician Assistants and Nurse Practitioners) who all work together to provide you with the care you need, when you need it.   Your next appointment:   1 month(s)  The format for your next appointment:   In Person  Provider:   Dorris Carnes, MD or Ermalinda Barrios, PA-C     Signed, Ermalinda Barrios, PA-C  02/10/2022 2:20 PM    [address] [place], [place], [place]  [address] Phone: [phone]; Fax: [phone]

## 2022-02-10 ENCOUNTER — Other Ambulatory Visit: Payer: Self-pay

## 2022-02-10 ENCOUNTER — Ambulatory Visit: Payer: Medicare HMO | Admitting: Physician Assistant

## 2022-02-10 ENCOUNTER — Encounter: Payer: Self-pay | Admitting: Physician Assistant

## 2022-02-10 VITALS — BP 126/76 | HR 74 | Ht 64.0 in | Wt 189.4 lb

## 2022-02-10 DIAGNOSIS — E785 Hyperlipidemia, unspecified: Secondary | ICD-10-CM | POA: Diagnosis not present

## 2022-02-10 DIAGNOSIS — R0609 Other forms of dyspnea: Secondary | ICD-10-CM | POA: Diagnosis not present

## 2022-02-10 DIAGNOSIS — I10 Essential (primary) hypertension: Secondary | ICD-10-CM | POA: Diagnosis not present

## 2022-02-10 DIAGNOSIS — I2510 Atherosclerotic heart disease of native coronary artery without angina pectoris: Secondary | ICD-10-CM

## 2022-02-10 MED ORDER — ROSUVASTATIN CALCIUM 20 MG PO TABS: 20.0000 mg | ORAL_TABLET | Freq: Every day | ORAL | 3 refills | Status: DC

## 2022-02-10 NOTE — Patient Instructions (Signed)
Medication Instructions:  Your physician has recommended you make the following change in your medication:   1) Increase Rosuvastatin to 86m daily  *If you need a refill on your cardiac medications before your next appointment, please call your pharmacy*   Lab Work: Your physician recommends that you return for a FASTING lipid profile in 3 months.   If you have labs (blood work) drawn today and your tests are completely normal, you will receive your results only by: MCopan(if you have MyChart) OR A paper copy in the mail If you have any lab test that is abnormal or we need to change your treatment, we will call you to review the results.   Testing/Procedures: Your physician has requested that you have an echocardiogram. Echocardiography is a painless test that uses sound waves to create images of your heart. It provides your doctor with information about the size and shape of your heart and how well your hearts chambers and valves are working. This procedure takes approximately one hour. There are no restrictions for this procedure.  Your physician has requested that you have en exercise stress myoview. For further information please visit [email or web address] Please follow instruction sheet, as given.    Follow-Up: At [facility] and your health needs are our priority.  As part of our continuing mission to provide you with exceptional heart care, we have created designated Provider Care Teams.  These Care Teams include your primary Cardiologist (physician) and Advanced Practice Providers (APPs -  Physician Assistants and Nurse Practitioners) who all work together to provide you with the care you need, when you need it.   Your next appointment:   1 month(s)  The format for your next appointment:   In Person  Provider:   PDorris Carnes MD or MErmalinda Barrios PA-C

## 2022-02-15 DIAGNOSIS — C61 Malignant neoplasm of prostate: Secondary | ICD-10-CM | POA: Diagnosis not present

## 2022-02-18 ENCOUNTER — Telehealth: Payer: Self-pay

## 2022-02-18 NOTE — Telephone Encounter (Signed)
**Note De-identified  Obfuscation** ERROR

## 2022-02-18 NOTE — Telephone Encounter (Signed)
**Note De-Identified  Obfuscation** We received a Rosuvastatin PA request from [place]. ?I called Krist[facility] Oppen[facility] to be sure that a PA is needed for this generic medication. ?Per the pharmacist the pt paid $11.99/30 day supply on 2/25 as he did not want to wait for a PA to be done. ? ?The pharmacist states that a PA is required because the preferred medication on the pts plan is Atorvastatin 40 mg. ? ?Will forward this message to Ermalinda Barrios, PA-c and her nurse for advisement to the pt. ?

## 2022-02-22 ENCOUNTER — Other Ambulatory Visit (HOSPITAL_BASED_OUTPATIENT_CLINIC_OR_DEPARTMENT_OTHER): Payer: Self-pay

## 2022-02-22 ENCOUNTER — Telehealth (HOSPITAL_COMMUNITY): Payer: Self-pay | Admitting: *Deleted

## 2022-02-22 DIAGNOSIS — C61 Malignant neoplasm of prostate: Secondary | ICD-10-CM | POA: Diagnosis not present

## 2022-02-22 NOTE — Telephone Encounter (Signed)
Patient given detailed instructions per Myocardial Perfusion Study Information Sheet for the test on 03/01/2022 at 10:15. Patient notified to arrive 15 minutes early and that it is imperative to arrive on time for appointment to keep from having the test rescheduled. ? If you need to cancel or reschedule your appointment, please call the office within 24 hours of your appointment. . Patient verbalized understanding.Glade Lloyd S ? ? ?

## 2022-02-24 ENCOUNTER — Other Ambulatory Visit: Payer: Self-pay | Admitting: Internal Medicine

## 2022-03-01 ENCOUNTER — Ambulatory Visit (HOSPITAL_COMMUNITY): Payer: Medicare HMO | Attending: Internal Medicine

## 2022-03-01 ENCOUNTER — Ambulatory Visit (HOSPITAL_BASED_OUTPATIENT_CLINIC_OR_DEPARTMENT_OTHER): Payer: Medicare HMO

## 2022-03-01 ENCOUNTER — Other Ambulatory Visit: Payer: Self-pay

## 2022-03-01 DIAGNOSIS — I10 Essential (primary) hypertension: Secondary | ICD-10-CM | POA: Diagnosis not present

## 2022-03-01 DIAGNOSIS — I2510 Atherosclerotic heart disease of native coronary artery without angina pectoris: Secondary | ICD-10-CM | POA: Diagnosis not present

## 2022-03-01 DIAGNOSIS — E785 Hyperlipidemia, unspecified: Secondary | ICD-10-CM

## 2022-03-01 LAB — ECHOCARDIOGRAM COMPLETE
Area-P 1/2: 3.95 cm2
Height: 64 in
S' Lateral: 3.4 cm
Weight: 3024 oz

## 2022-03-01 LAB — MYOCARDIAL PERFUSION IMAGING
Estimated workload: 7
Exercise duration (min): 5 min
Exercise duration (sec): 45 s
LV dias vol: 81 mL (ref 62–150)
LV sys vol: 40 mL
MPHR: 155 {beats}/min
Nuc Stress EF: 51 %
Peak HR: 150 {beats}/min
Percent HR: 96 %
RPE: 18
Rest HR: 73 {beats}/min
Rest Nuclear Isotope Dose: 10.8 mCi
SDS: 1
SRS: 0
SSS: 1
ST Depression (mm): 0 mm
Stress Nuclear Isotope Dose: 32.9 mCi
TID: 1.09

## 2022-03-01 MED ORDER — TECHNETIUM TC 99M TETROFOSMIN IV KIT
32.8000 | PACK | Freq: Once | INTRAVENOUS | Status: AC | PRN
  Administered 2022-03-01: 32.8 via INTRAVENOUS
  Filled 2022-03-01: qty 33

## 2022-03-01 MED ORDER — TECHNETIUM TC 99M TETROFOSMIN IV KIT
10.8000 | PACK | Freq: Once | INTRAVENOUS | Status: AC | PRN
  Administered 2022-03-01: 10.8 via INTRAVENOUS
  Filled 2022-03-01: qty 11

## 2022-03-08 NOTE — Progress Notes (Signed)
? ?Cardiology Office Note ? ? ?Date:  03/09/2022  ? ?ID:  Tyler Estrada, DOB [birth year], MRN [id number] ? ?PCP:  Copland, Gay Filler, MD  ?Cardiologist:   Dorris Carnes, MD  ? ?F/U of CAD and DOE    ? ?  ?History of Present Illness: ?Tyler Estrada is a 66 y.o. male with a history ofcoronary artery disease status post prior stenting to the RCA in 2008, prior pericarditis treated with colchicine, hypertension, hyperlipidemia, ulcerative colitis.  Cardiac catheterization in November 2016 demonstrated patent stents in the RCA and moderate nonobstructive disease in the LAD.  Ejection fraction by echocardiogram in 2016 was normal.   ? ?I saw the pt in 2020    ?He was seen by Gerrianne Scale Feb 2023    Complained of DOE at that time      ?Echo was normal     Stress myoview    Walked almost 6 min   Normal perfusion   NO ischemia  LVEF 51%   ? ?He says he still gets SOB with exertion   Questions if it is deconditioning    Denies CP    ? ?Breakfast  usually skips   If eats english muffin ?Lunch   Hamburger    Not many vegie ?Dinner   Skips    ?Snack   Straws     ?Diet Pepsi    SOme water   Coffee ? ? ? ? ?Current Meds  ?Medication Sig  ? aspirin EC 81 MG tablet Take 81 mg by mouth at bedtime.   ? colchicine 0.6 MG tablet Take by mouth as needed.  ? furosemide (LASIX) 20 MG tablet TAKE ONE TABLET BY MOUTH DAILY; USE AS NEEDED FOR SWELLING OF LEGS  ? loratadine (CLARITIN) 10 MG tablet Take 10 mg by mouth daily.  ? meloxicam (MOBIC) 7.5 MG tablet TAKE 1 TABLET (7.5 MG TOTAL) BY MOUTH DAILY. USE AS NEEDED FOR ARTHRITIS  ? mesalamine (LIALDA) 1.2 g EC tablet Take 1.2 g by mouth in the morning and at bedtime.   ? methotrexate (RHEUMATREX) 2.5 MG tablet Take 6 tablets (15 mg total) by mouth once a week. Caution:Chemotherapy. Protect from light.  ? metoprolol tartrate (LOPRESSOR) 25 MG tablet TAKE 1/2 TABLET BY MOUTH DAILY  ? montelukast (SINGULAIR) 10 MG tablet TAKE ONE TABLET BY MOUTH EVERY NIGHT AT BEDTIME  ? Multiple Vitamins-Minerals  (CENTRUM SILVER PO) Take 1 tablet by mouth daily.   ? rosuvastatin (CRESTOR) 20 MG tablet Take 1 tablet (20 mg total) by mouth daily.  ? tamsulosin (FLOMAX) 0.4 MG CAPS capsule Take 0.4 mg by mouth daily.  ? triamcinolone ointment (KENALOG) 0.5 % Apply 1 application topically 2 (two) times daily. Use as needed for skin rash  ? [DISCONTINUED] nitroGLYCERIN (NITROSTAT) 0.4 MG SL tablet Place 1 tablet (0.4 mg total) under the tongue every 5 (five) minutes as needed for chest pain.  ? ? ? ?Allergies:   Prednisone  ? ?Past Medical History:  ?Diagnosis Date  ? Arthritis   ? knees, hips, hands  ? Complication of anesthesia   ? Woke up during hip replacement, had surgery since with no problems  ? Coronary artery disease   ? a. s/p DES to RCA in 2008;  b. 10/2015 Cath: LM nl, LAD 50ost/p, OM1/2 min irregs, RCA 75mISR, 358m ISR, EF 65%.  ? Echocardiogram   ? Echo 11/16: EF 60-65, normal wall motion, grade 1 diastolic dysfunction  ? Grade I diastolic dysfunction 1156/38/9373? ECHO  ?  Hyperlipidemia   ? Iron deficiency anemia due to chronic blood loss 09/10/2015  ? Melanoma ([place])   ? Nose  ? Pericarditis   ? a. 10/2015->colchicine.  ? Prostate cancer ([place])   ? Ulcerative colitis with complication ([place]) 3/79/0240  ? ? ?Past Surgical History:  ?Procedure Laterality Date  ? ANTERIOR CERVICAL DECOMP/DISCECTOMY FUSION N/A 03/26/2020  ? Procedure: Cervical Four-Five, Cervical Five-Six, Cervical Six-Seven Anterior Cervical Decompression/Discectomy/Fusion;  Surgeon: Ashok Pall, MD;  Location: [facility];  Service: Neurosurgery;  Laterality: N/A;  3C  ? CARDIAC CATHETERIZATION N/A 11/18/2015  ? Procedure: Left Heart Cath and Coronary Angiography;  Surgeon: Sherren Mocha, MD;  Location: [facility];  Service: Cardiovascular;  Laterality: N/A;  ? CARPAL TUNNEL RELEASE Right 03/26/2020  ? Procedure: CARPAL TUNNEL RELEASE;  Surgeon: Ashok Pall, MD;  Location: [facility];  Service: Neurosurgery;  Laterality: Right;  ? CERVICAL SPINE SURGERY     ? COLONOSCOPY  2019  ? CORONARY ANGIOPLASTY WITH STENT PLACEMENT  2008  ? a. Cordis stent to RCA in 2008 2.16m x 122m ? HIP ARTHROPLASTY Right   ? MOHS SURGERY    ? PARTIAL KNEE ARTHROPLASTY Left 11/30/2018  ? Procedure: LEFT UNICOMPARTMENTAL KNEE Medially;  Surgeon: OlParalee CancelMD;  [facility];  Service: Orthopedics;  Laterality: Left;  70 mins ?with block  ? RADIOACTIVE SEED IMPLANT N/A 08/10/2019  ? Procedure: RADIOACTIVE SEED IMPLANT/BRACHYTHERAPY IMPLANT;  Surgeon: MaAlexis FrockMD;  [facility];  Laterality: N/A;  ? SPACE OAR INSTILLATION N/A 08/10/2019  ? Procedure: SPACE OAR INSTILLATION;  Surgeon: MaAlexis FrockMD;  [facility];  Laterality: N/A;  ? TONSILLECTOMY    ? childhood  ? ? ? ?Social History:  The patient  reports that he has never smoked. He has never used smokeless tobacco. He reports current alcohol use. He reports that he does not use drugs.  ? ?Family History:  The patient's family history includes Arthritis in his mother and sister; CAD in his father; Cancer in his mother; Heart attack in his father; Heart disease in his father and sister; Hypertension in his brother and mother; Lymphoma in his mother.  ? ? ?ROS:  Please see the history of present illness. All other systems are reviewed and  Negative to the above problem except as noted.  ? ? ?PHYSICAL EXAM: ?VS:  BP 121/71   Pulse 61   Ht 5' 4"  (1.626 m)   Wt 192 lb (87.1 kg)   SpO2 98%   BMI 32.96 kg/m?   ?GEN: Obese 6277o  in no acute distress  ?HEENT: normal  ?Neck: JVP is not elevated   Neck is full   ?Cardiac: RRR; no murmurs,   NO LE edema  ?Respiratory:  clear to auscultation bilaterally, ?GI: soft, nontender, nondistended, + BS  No hepatomegaly  ?MS: no deformity Moving all extremities   ?Skin: warm and dry, no rash ?Neuro:  Strength and sensation are intact ?Psych: euthymic mood, full affect ? ? ?EKG:  EKG is not ordered  today   ? ? ?Lipid Panel ?   ?Component Value Date/Time  ? CHOL 185 09/23/2021 1138  ? CHOL 158 11/12/2019 1527  ? TRIG 153.0 (H) 09/23/2021 1138  ? HDL 56.20 09/23/2021 1138  ? HDL 52 11/12/2019 1527  ? CHOLHDL 3 09/23/2021 1138  ? VLDL 30.6 09/23/2021 1138  ? LDEast [place]8 09/23/2021 1138  ? LD[place]06 (H) 10/13/2020 0908  ? ?  ? ?  Wt Readings from Last 3 Encounters:  ?03/09/22 192 lb (87.1 kg)  ?03/01/22 189 lb (85.7 kg)  ?02/10/22 189 lb 6.4 oz (85.9 kg)  ?  ? ? ?ASSESSMENT AND PLAN: ? ?1  DOE   Pt has noted over past several months   Myoview and echo OK   He says he could have walked longer even on treadmill    ?REocmm   Follow   Increase activity     ? Deconditioning  ? ?2 CAD As above   No CP   Follow  ? ?3   HTN  BP is well controlled   ? ?4   HL  Will check lipdis today\   ? ?F/U next winter  ? ? ?Current medicines are reviewed at length with the patient today.  The patient does not have concerns regarding medicines. ? ?Signed, ?Dorris Carnes, MD  ?03/09/2022 1:20 PM    ?[facility] ?[address], [place], [place]  [address] ?Phone: [phone]; Fax: [phone]  ? ? ?

## 2022-03-09 ENCOUNTER — Encounter: Payer: Self-pay | Admitting: Internal Medicine

## 2022-03-09 ENCOUNTER — Ambulatory Visit: Payer: Medicare HMO | Admitting: Internal Medicine

## 2022-03-09 ENCOUNTER — Other Ambulatory Visit: Payer: Self-pay

## 2022-03-09 DIAGNOSIS — I10 Essential (primary) hypertension: Secondary | ICD-10-CM

## 2022-03-09 DIAGNOSIS — I2510 Atherosclerotic heart disease of native coronary artery without angina pectoris: Secondary | ICD-10-CM

## 2022-03-09 DIAGNOSIS — E785 Hyperlipidemia, unspecified: Secondary | ICD-10-CM | POA: Diagnosis not present

## 2022-03-09 DIAGNOSIS — R0609 Other forms of dyspnea: Secondary | ICD-10-CM

## 2022-03-09 LAB — LIPID PANEL
Chol/HDL Ratio: 2.4 ratio (ref 0.0–5.0)
Cholesterol, Total: 125 mg/dL (ref 100–199)
HDL: 53 mg/dL (ref >39)
LDL Chol Calc (NIH): 56 mg/dL (ref 0–99)
Triglycerides: 81 mg/dL (ref 0–149)
VLDL Cholesterol Cal: 16 mg/dL (ref 5–40)

## 2022-03-09 MED ORDER — NITROGLYCERIN 0.4 MG SL SUBL: 0.4000 mg | SUBLINGUAL_TABLET | SUBLINGUAL | 3 refills | Status: DC | PRN

## 2022-03-09 NOTE — Patient Instructions (Signed)
Medication Instructions:  ?Your physician recommends that you continue on your current medications as directed. Please refer to the Current Medication list given to you today. ? ?*If you need a refill on your cardiac medications before your next appointment, please call your pharmacy* ? ? ?Lab Work: ?none ?If you have labs (blood work) drawn today and your tests are completely normal, you will receive your results only by: ?MyChart Message (if you have MyChart) OR ?A paper copy in the mail ?If you have any lab test that is abnormal or we need to change your treatment, we will call you to review the results. ? ? ?Testing/Procedures: ?none ? ? ?Follow-Up: ?At [facility], you and your health needs are our priority.  As part of our continuing mission to provide you with exceptional heart care, we have created designated Provider Care Teams.  These Care Teams include your primary Cardiologist (physician) and Advanced Practice Providers (APPs -  Physician Assistants and Nurse Practitioners) who all work together to provide you with the care you need, when you need it. ? ?We recommend signing up for the patient portal called "MyChart".  Sign up information is provided on this After Visit Summary.  MyChart is used to connect with patients for Virtual Visits (Telemedicine).  Patients are able to view lab/test results, encounter notes, upcoming appointments, etc.  Non-urgent messages can be sent to your provider as well.   ?To learn more about what you can do with MyChart, go to [email or web address].   ? ?Your next appointment:   ?8 month(s) ? ?The format for your next appointment:   ?In Person ? ?Provider:   ?Dorris Carnes, MD   ? ? ?Other Instructions ?  ?

## 2022-03-19 ENCOUNTER — Other Ambulatory Visit: Payer: Self-pay | Admitting: Family Medicine

## 2022-03-19 DIAGNOSIS — R600 Localized edema: Secondary | ICD-10-CM

## 2022-04-07 DIAGNOSIS — Z0100 Encounter for examination of eyes and vision without abnormal findings: Secondary | ICD-10-CM | POA: Diagnosis not present

## 2022-04-12 DIAGNOSIS — K5180 Other ulcerative colitis without complications: Secondary | ICD-10-CM | POA: Diagnosis not present

## 2022-04-12 DIAGNOSIS — Z6831 Body mass index (BMI) 31.0-31.9, adult: Secondary | ICD-10-CM | POA: Diagnosis not present

## 2022-04-12 DIAGNOSIS — C61 Malignant neoplasm of prostate: Secondary | ICD-10-CM | POA: Diagnosis not present

## 2022-04-12 DIAGNOSIS — M0609 Rheumatoid arthritis without rheumatoid factor, multiple sites: Secondary | ICD-10-CM | POA: Diagnosis not present

## 2022-04-12 DIAGNOSIS — E669 Obesity, unspecified: Secondary | ICD-10-CM | POA: Diagnosis not present

## 2022-05-04 NOTE — Progress Notes (Addendum)
[place] at [facility] [address] [place], [place] [address] [phone]  Date:  05/05/2022   Name:  Tyler Estrada   DOB:  1956-09-30   MRN:  [id number]  PCP:  Darreld Mclean, MD    Chief Complaint: left eye issues (Pt says a couple of weeks ago while traveling on the plane he developed a pain in his head on the L side along with changes of eye sight in the L eye. He says after landing the head pain resolved but other sxs remained. /)   History of Present Illness:  Tyler Estrada is a 66 y.o. very pleasant male patient who presents with the following:  Patient seen today with a concern regarding his left eye- history of hypertension, prediabetes, hyperlipidemia, sleep apnea treated with oral appliance, vitamin D deficiency He underwent a C4-7 decompression in April of 2022 and has done overall quite well from that standpoint He also has osteoarthritis, using methotrexate per rheumatology  Most recent visit with myself was in February, at which point he was having some shortness of breath Pulmonary work-up at that time was negative, we had him follow-up with cardiology-he saw Dr. Harrington Challenger most recently on March 21 He did a stress Myoview which looks good-symptoms thought due to deconditioning 1  DOE   Pt has noted over past several months   Myoview and echo OK   He says he could have walked longer even on treadmill    REocmm   Follow   Increase activity     ? Deconditioning  2 CAD As above   No CP   Follow  3   HTN  BP is well controlled   4   HL  Will check lipdis today  About 2 weeks ago he was on a flight to [place] and developed severe left sided headache around the left eye.  It went away when they landed in [place] However he then noted a change in the left eye vision- he is also seeing some black lines and bubbles/ floaters which are new and persistent  No further pain or abnormal headaches He also feels  like he balance is not as good- he was using his wife's scooter for support.  He still notes that his balance does not seem normal, but no other neurologic symptoms noted No falls, he has not hit his head  No numbness or weakness in his limbs, no facial droop or difficulty speaking or swallowing   His BP med was reduced earlier this year-blood pressure has remained under good control Patient Active Problem List   Diagnosis Date Noted   Pre-op evaluation 12/01/2020   Essential hypertension 12/01/2020   Cervical spondylosis with myelopathy and radiculopathy 03/26/2020   Prostate cancer ([place]) 04/12/2019   Obese 12/01/2018   S/P left UKR 11/30/2018   H/O pericarditis    Elevated troponin 11/19/2015   Dyslipidemia, goal LDL below 70    CAD S/P percutaneous coronary angioplasty 11/18/2015   Chest pain 11/18/2015   Iron deficiency anemia due to chronic blood loss 09/10/2015   Ulcerative colitis with complication ([place]) 60/09/9322    Past Medical History:  Diagnosis Date   Arthritis    knees, hips, hands   Complication of anesthesia    Woke up during hip replacement, had surgery since with no problems   Coronary artery disease    a. s/p DES to RCA in 2008;  b.  10/2015 Cath: LM nl, LAD 50ost/p, OM1/2 min irregs, RCA 48mISR, 369m ISR, EF 65%.   Echocardiogram    Echo 11/16: EF 60-65, normal wall motion, grade 1 diastolic dysfunction   Grade I diastolic dysfunction 1141/93/7902 ECHO   Hyperlipidemia    Iron deficiency anemia due to chronic blood loss 09/10/2015   Melanoma ([facility])    Nose   Pericarditis    a. 10/2015->colchicine.   [facility]state cancer ([facility]   Ulcerative colitis with complication ([facility]/03/28/7352  Past Surgical History:  Procedure Laterality Date   ANTERIOR CERVICAL DECOMP/DISCECTOMY FUSION N/A 03/26/2020   Procedure: Cervical Four-Five, Cervical Five-Six, Cervical Six-Seven Anterior Cervical Decompression/Discectomy/Fusion;  Surgeon: CaAshok PallMD;  [facility]  Service: Neurosurgery;  Laterality: N/A;  3C   CARDIAC CATHETERIZATION N/A 11/18/2015   Procedure: Left Heart Cath and Coronary Angiography;  Surgeon: MiSherren MochaMD;  [facility];  Service: Cardiovascular;  Laterality: N/A;   CARPAL TUNNEL RELEASE Right 03/26/2020   Procedure: CARPAL TUNNEL RELEASE;  Surgeon: CaAshok PallMD;  [facility] Service: Neurosurgery;  Laterality: Right;   CERVICAL SPINE SURGERY     COLONOSCOPY  2019   CORONARY ANGIOPLASTY WITH STENT PLACEMENT  2008   a. Cordis stent to RCA in 2008 2.7530m 19m87mHIP ARTHROPLASTY Right    MOHS SURGERY     PARTIAL KNEE ARTHROPLASTY Left 11/30/2018   Procedure: LEFT UNICOMPARTMENTAL KNEE Medially;  Surgeon: OlinParalee Cancel;  [facility];  Service: Orthopedics;  Laterality: Left;  70 mins with block   RADIOACTIVE SEED IMPLANT N/A 08/10/2019   Procedure: RADIOACTIVE SEED IMPLANT/BRACHYTHERAPY IMPLANT;  Surgeon: MannAlexis Frock;  [facility];  Laterality: N/A;   SPACE OAR INSTILLATION N/A 08/10/2019   Procedure: SPACE OAR INSTILLATION;  Surgeon: MannAlexis Frock;  [facility];  Laterality: N/A;   TONSILLECTOMY     childhood    Social History   Tobacco Use   Smoking status: Never   Smokeless tobacco: Never  Vaping Use   Vaping Use: Never used  Substance Use Topics   Alcohol use: Yes    Alcohol/week: 0.0 standard drinks    Comment: 1-2 drinks per week.   Drug use: No    Family History  Problem Relation Age of Onset   Lymphoma Mother    Hypertension Mother    Arthritis Mother    Cancer Mother    CAD Father    Heart disease Father    Heart attack Father    Arthritis Sister    Heart disease Sister    Hypertension Brother     Allergies  Allergen Reactions   Prednisone     Blurred vision     Medication list has been reviewed and updated.  Current Outpatient Medications on File Prior  to Visit  Medication Sig Dispense Refill   aspirin EC 81 MG tablet Take 81 mg by mouth at bedtime.      furosemide (LASIX) 20 MG tablet TAKE ONE TABLET BY MOUTH DAILY AS NEEDED FOR SWELLING OF LEGS 90 tablet 0   loratadine (CLARITIN) 10 MG tablet Take 10 mg by mouth daily.     meloxicam (MOBIC) 7.5 MG tablet TAKE 1 TABLET (7.5 MG TOTAL) BY MOUTH DAILY. USE AS NEEDED FOR ARTHRITIS 30 tablet 6   mesalamine (LIALDA) 1.2 g EC tablet Take 1.2 g by mouth in the  morning and at bedtime.      methotrexate (RHEUMATREX) 2.5 MG tablet Take 6 tablets (15 mg total) by mouth once a week. Caution:Chemotherapy. Protect from light. 20 tablet 0   metoprolol tartrate (LOPRESSOR) 25 MG tablet TAKE 1/2 TABLET BY MOUTH DAILY 45 tablet 3   montelukast (SINGULAIR) 10 MG tablet TAKE ONE TABLET BY MOUTH EVERY NIGHT AT BEDTIME 90 tablet 3   Multiple Vitamins-Minerals (CENTRUM SILVER PO) Take 1 tablet by mouth daily.      nitroGLYCERIN (NITROSTAT) 0.4 MG SL tablet Place 1 tablet (0.4 mg total) under the tongue every 5 (five) minutes as needed for chest pain. 25 tablet 3   rosuvastatin (CRESTOR) 20 MG tablet Take 1 tablet (20 mg total) by mouth daily. 90 tablet 3   tamsulosin (FLOMAX) 0.4 MG CAPS capsule Take 0.4 mg by mouth daily.     triamcinolone ointment (KENALOG) 0.5 % Apply 1 application topically 2 (two) times daily. Use as needed for skin rash 60 g 1   No current facility-administered medications on file prior to visit.    Review of Systems:  As per HPI- otherwise negative.   Physical Examination: Vitals:   05/05/22 1358  BP: 124/70  Pulse: 60  Resp: 18  Temp: 97.8 F (36.6 C)  SpO2: 99%   Vitals:   05/05/22 1358  Weight: 186 lb 12.8 oz (84.7 kg)  Height: 5' 4"  (1.626 m)   Body mass index is 32.06 kg/m. Ideal Body Weight: Weight in (lb) to have BMI = 25: 145.3  GEN: no acute distress.  Looks well, mildly obese HEENT: Atraumatic, Normocephalic. Bilateral TM wnl, oropharynx normal.  PEERL,EOMI.    Ears and Nose: No external deformity. CV: RRR, No M/G/R. No JVD. No thrill. No extra heart sounds. PULM: CTA B, no wheezes, crackles, rhonchi. No retractions. No resp. distress. No accessory muscle use. ABD: S, NT, ND. No rebound. No HSM. EXTR: No c/c/e PSYCH: Normally interactive. Conversant.  Normal strength, sensation, DTR of all extremities except left knee which is status post joint replacement Normal sensation and movement of facial muscles Romberg is normal Patient cannot do tandem stance, he is not sure if he could have done this before  Assessment and Plan: Vision changes - Plan: Basic metabolic panel, MR Brain W Wo Contrast  Primary thunderclap headache - Plan: MR Brain W Wo Contrast  Patient seen today with 2-week history of vision change and balance change, seemed to start with a severe headache Headache is now resolved I called his optometrist, they will see him right away We will set up an MRI for tomorrow   Signed Lamar Blinks, MD  Called patient around 7 PM.  He reports optometry found no sign of retinal detachment, he does have a floater but they think this will resolve.  Plans for MRI tomorrow.  Received labs and MRI report 5/18- called pt  Results for orders placed or performed in visit on 48/18/56  Basic metabolic panel  Result Value Ref Range   Sodium 140 135 - 145 mEq/L   Potassium 4.2 3.5 - 5.1 mEq/L   Chloride 107 96 - 112 mEq/L   CO2 25 19 - 32 mEq/L   Glucose, Bld 95 70 - 99 mg/dL   BUN 15 6 - 23 mg/dL   Creatinine, Ser 0.78 0.40 - 1.50 mg/dL   GFR 93.65 >60.00 mL/min   Calcium 9.2 8.4 - 10.5 mg/dL    MR Brain W Wo Contrast  Result Date: 05/06/2022 CLINICAL DATA:  Vision loss, monocular; pt had severe headache and then vision change left eye 2 weeks ago- vision change persists EXAM: MRI HEAD WITHOUT AND WITH CONTRAST TECHNIQUE: Multiplanar, multiecho pulse sequences of the brain and surrounding structures were obtained without and with  intravenous contrast. CONTRAST:  8.84m GADAVIST GADOBUTROL 1 MMOL/ML IV SOLN COMPARISON:  None Available. FINDINGS: Brain: There is no acute infarction or intracranial hemorrhage. There is no intracranial mass, mass effect, or edema. There is no hydrocephalus or extra-axial fluid collection. Ventricles and sulci are normal in size and configuration. A punctate focus of T2 hyperintensity in the right frontal white matter likely reflects nonspecific gliosis/demyelination of doubtful significance. No abnormal enhancement. Vascular: Major vessel flow voids at the skull base are preserved. Skull and upper cervical spine: Normal marrow signal is preserved. Partially imaged susceptibility artifact from cervical spine ACDF. Sinuses/Orbits: Paranasal sinus mucosal thickening. Orbits are unremarkable. Other: Sella is unremarkable.  Mastoid air cells are clear. IMPRESSION: No evidence of recent infarction, hemorrhage, or mass. No abnormal enhancement. Electronically Signed   By: PMacy MisM.D.   On: 05/06/2022 11:04    Sx are the same.  We plan -pt will call [facility] and follow-up with them in case related -order carotid [place]-urgent neurology referral -he will contact me if any change or worsening of his sx, or if any HA returns

## 2022-05-04 NOTE — Patient Instructions (Addendum)
It was great to see you again today- please head over to [facility] now  ? ?[address] ?[place], [place] [address] ?[phone] ?You can start your shingles vaccine series at the pharmacy at any point ?You can also do 1 more dose of COVID at your convenience ?I will set up an MRI of your head assuming your eye exam is ok  ? ? ?

## 2022-05-05 ENCOUNTER — Ambulatory Visit (INDEPENDENT_AMBULATORY_CARE_PROVIDER_SITE_OTHER): Payer: Medicare HMO | Admitting: Family Medicine

## 2022-05-05 VITALS — BP 124/70 | HR 60 | Temp 97.8°F | Resp 18 | Ht 64.0 in | Wt 186.8 lb

## 2022-05-05 DIAGNOSIS — H43812 Vitreous degeneration, left eye: Secondary | ICD-10-CM | POA: Diagnosis not present

## 2022-05-05 DIAGNOSIS — H539 Unspecified visual disturbance: Secondary | ICD-10-CM

## 2022-05-05 DIAGNOSIS — G4453 Primary thunderclap headache: Secondary | ICD-10-CM | POA: Diagnosis not present

## 2022-05-06 ENCOUNTER — Ambulatory Visit (HOSPITAL_BASED_OUTPATIENT_CLINIC_OR_DEPARTMENT_OTHER)
Admission: RE | Admit: 2022-05-06 | Discharge: 2022-05-06 | Disposition: A | Discharge status: Home / Self Care | Payer: Medicare HMO | Source: Ambulatory Visit | Attending: Family Medicine | Admitting: Family Medicine

## 2022-05-06 DIAGNOSIS — H5462 Unqualified visual loss, left eye, normal vision right eye: Secondary | ICD-10-CM | POA: Insufficient documentation

## 2022-05-06 DIAGNOSIS — H539 Unspecified visual disturbance: Secondary | ICD-10-CM

## 2022-05-06 DIAGNOSIS — Z8546 Personal history of malignant neoplasm of prostate: Secondary | ICD-10-CM | POA: Diagnosis not present

## 2022-05-06 DIAGNOSIS — G4453 Primary thunderclap headache: Secondary | ICD-10-CM

## 2022-05-06 DIAGNOSIS — R519 Headache, unspecified: Secondary | ICD-10-CM | POA: Diagnosis not present

## 2022-05-06 LAB — BASIC METABOLIC PANEL
BUN: 15 mg/dL (ref 6–23)
CO2: 25 mEq/L (ref 19–32)
Calcium: 9.2 mg/dL (ref 8.4–10.5)
Chloride: 107 mEq/L (ref 96–112)
Creatinine, Ser: 0.78 mg/dL (ref 0.40–1.50)
GFR: 93.65 mL/min (ref >60.00)
Glucose, Bld: 95 mg/dL (ref 70–99)
Potassium: 4.2 mEq/L (ref 3.5–5.1)
Sodium: 140 mEq/L (ref 135–145)

## 2022-05-06 MED ORDER — GADOBUTROL 1 MMOL/ML IV SOLN
8.5000 mL | Freq: Once | INTRAVENOUS | Status: AC | PRN
  Administered 2022-05-06: 8.5 mL via INTRAVENOUS
  Filled 2022-05-06: qty 10

## 2022-05-06 NOTE — Addendum Note (Signed)
Addended by: Lamar Blinks C on: 05/06/2022 01:59 PM   Modules accepted: Orders

## 2022-05-07 ENCOUNTER — Telehealth: Payer: Self-pay

## 2022-05-07 NOTE — Telephone Encounter (Signed)
Authorization number: [id number] Dates valid: 05/05/2022 - 11/01/2022

## 2022-05-10 ENCOUNTER — Ambulatory Visit (HOSPITAL_COMMUNITY)
Admission: RE | Admit: 2022-05-10 | Discharge: 2022-05-10 | Disposition: A | Discharge status: Home / Self Care | Payer: Medicare HMO | Source: Ambulatory Visit | Attending: Family Medicine | Admitting: Family Medicine

## 2022-05-10 ENCOUNTER — Encounter: Payer: Self-pay | Admitting: Family Medicine

## 2022-05-10 ENCOUNTER — Other Ambulatory Visit: Payer: Medicare HMO

## 2022-05-10 ENCOUNTER — Encounter: Payer: Self-pay | Admitting: Internal Medicine

## 2022-05-10 DIAGNOSIS — H539 Unspecified visual disturbance: Secondary | ICD-10-CM | POA: Insufficient documentation

## 2022-05-14 ENCOUNTER — Telehealth: Payer: Self-pay | Admitting: Family Medicine

## 2022-05-14 NOTE — Telephone Encounter (Signed)
Pt made an appt via mychart 6/5 for a lump in his chest. Called pt to go over sxs and see if he wanted a sooner appt. He stated it is a lump in his chest area around his nipple and is sensitive to the touch. Scheduled him for this Thursday instead and triaged so he could go over any issues further with a nurse.

## 2022-05-14 NOTE — Telephone Encounter (Signed)
Nurse Assessment Nurse: Gareth Eagle, RN, Raquel Sarna Date/Time Eilene Ghazi Time): 05/14/2022 3:51:38 PM Confirm and document reason for call. If symptomatic, describe symptoms. ---Caller states his right breast feels sensitive to the touch and has a lump the size of a nickel. Noticed the lump yesterday. He had prostate cancer and had hormone blocker injection 6-81moago. Does the patient have any new or worsening symptoms? ---Yes Will a triage be completed? ---Yes Related visit to physician within the last 2 weeks? ---No Does the PT have any chronic conditions? (i.e. diabetes, asthma, this includes High risk factors for pregnancy, etc.) ---Yes List chronic conditions. ---hx prostate cancer, arthritis, ulcerative colitis Is this a behavioral health or substance abuse call? ---No Guidelines Guideline Title Affirmed Question Affirmed Notes Nurse Date/Time (Eilene GhaziTime) Breast Symptoms Breast lump CTheodoro Grist5/26/2023 3:53:41 PM Disp. Time (Eilene GhaziTime) Disposition Final User 05/14/2022 3:28:46 PM Attempt made - message left D'Heur NMacky Lower5/26/2023 3:44:59 PM Send To RN Personal D'Heur NLucia Gaskins RN, Adrienne PLEASE NOTE: All timestamps contained within this report are represented as [place] Time. CONFIDENTIALTY NOTICE: This fax transmission is intended only for the addressee. It contains information that is legally privileged, confidential or otherwise protected from use or disclosure. If you are not the intended recipient, you are strictly prohibited from reviewing, disclosing, copying using or disseminating any of this information or taking any action in reliance on or regarding this information. If you have received this fax in error, please notify u[place]mediately by telephone so that we can arrange for its return to [place] Phone: [phone] Toll-Free: [phone] Fax: [phone]: 2 of 2 Call Id: [id number]/26/2023 3:56:55 PM SEE PCP WITHIN 3 DAYS Yes CGareth Eagle RN,  EJudeth HornDisagree/Comply Comply Caller Understands Yes PreDisposition Did not know what to do Care Advice Given Per Guideline SEE PCP WITHIN 3 DAYS: * You need to be seen within 2 or 3 days. CALL BACK IF: * Fever or redness occurs * You become worse CARE ADVICE given per Breast Symptoms (Adult) guideline. Comments User: ETilden Fossa RN Date/Time (Eastern Time): 05/14/2022 3:55:33 PM States he has appt on 6/1 at 1:20 with Dr. CLorelei Pont Referrals REFERRED TO PCP OFFICE

## 2022-05-18 ENCOUNTER — Encounter: Payer: Self-pay | Admitting: Family Medicine

## 2022-05-18 NOTE — Telephone Encounter (Signed)
Patient has an appointment on 05/20/22

## 2022-05-18 NOTE — Progress Notes (Unsigned)
[place] [facility] [address], [place], [place] [address] [phone]  Date:  05/20/2022   Name:  Tyler Estrada   DOB:  1956/08/11   MRN:  [id number]  PCP:  Darreld Mclean, MD    Chief Complaint: right breast pain (Pt states his right breast feels sensitive to the touch and has a lump the size of a nickel. He has a hx of prostate cancer and had hormone blocker injection 6-25moago.)   History of Present Illness:  Tyler Delashmitis a 66y.o. very pleasant male patient who presents with the following:  Patient seen today with concern of lump in his chest area-history of hypertension, prediabetes, hyperlipidemia, sleep apnea treated with oral appliance, vitamin D deficiency He underwent a C4-7 decompression in April of 2022 and has done overall quite well from that standpoint He also has rheumatoid arthritis using methotrexate per rheumatology   Most recently on May 17-last time he had noted a headache and vision change a couple of weeks prior [place]  We obtained an MRI of his head as well as carotid ultrasound He also saw his eye doctor at the time and all was ok- he does have floaters but these have not gotten worse   He has a neurology appointment scheduled for June 7  Pt has noted a right breast lump about 5 days ago Never had before that he is aware of  It is tender but not red No fever or other symptoms No nipple discharge   No family history of breast cancer - possibly an aunt had breast cancer  Patient Active Problem List   Diagnosis Date Noted   Pre-op evaluation 12/01/2020   Essential hypertension 12/01/2020   Cervical spondylosis with myelopathy and radiculopathy 03/26/2020   [place] ([place] 04/12/2019   Obese 12/01/2018   S/P left UKR 11/30/2018   H/O pericarditis    Elevated troponin 11/19/2015   Dyslipidemia, goal LDL below 70    CAD S/P percutaneous coronary angioplasty  11/18/2015   Chest pain 11/18/2015   Iron deficiency anemia due to chronic blood loss 09/10/2015   Ulcerative colitis with complication ([facility] 035/59/7416   Past Medical History:  Diagnosis Date   Arthritis    knees, hips, hands   Complication of anesthesia    Woke up during hip replacement, had surgery since with no problems   Coronary artery disease    a. s/p DES to RCA in 2008;  b. 10/2015 Cath: LM nl, LAD 50ost/p, OM1/2 min irregs, RCA 232mSR, 3030mISR, EF 65%.   Echocardiogram    Echo 11/16: EF 60-65, normal wall motion, grade 1 diastolic dysfunction   Grade I diastolic dysfunction 11/38/45/3646ECHO   Hyperlipidemia    Iron deficiency anemia due to chronic blood loss 09/10/2015   Melanoma (HCC)    Nose   Pericarditis    a. 10/2015->colchicine.   Prostate cancer ([facility]  Ulcerative colitis with complication ([facility]/28/02/2121 Past Surgical History:  Procedure Laterality Date   ANTERIOR CERVICAL DECOMP/DISCECTOMY FUSION N/A 03/26/2020   Procedure: Cervical Four-Five, Cervical Five-Six, Cervical Six-Seven Anterior Cervical Decompression/Discectomy/Fusion;  Surgeon: CabAshok PallD;  [facility];  Laterality: N/A;  3C   CARDIAC CATHETERIZATION N/A 11/18/2015   Procedure: Left Heart Cath and Coronary Angiography;  Surgeon: MicSherren MochaD;  [facility] [place] [facility];  Service:  Cardiovascular;  Laterality: N/A;   CARPAL TUNNEL RELEASE Right 03/26/2020   Procedure: CARPAL TUNNEL RELEASE;  Surgeon: Ashok Pall, MD;  Location: [place];  Service: Neurosurgery;  Laterality: Right;   CERVICAL SPINE SURGERY     COLONOSCOPY  2019   CORONARY ANGIOPLASTY WITH STENT PLACEMENT  2008   a. Cordis stent to RCA in 2008 2.58m x 157m  HIP ARTHROPLASTY Right    MOHS SURGERY     PARTIAL KNEE ARTHROPLASTY Left 11/30/2018   Procedure: LEFT UNICOMPARTMENTAL KNEE Medially;  Surgeon: OlParalee CancelMD;  [facility];  Service: Orthopedics;  Laterality: Left;  70  mins with block   RADIOACTIVE SEED IMPLANT N/A 08/10/2019   Procedure: RADIOACTIVE SEED IMPLANT/BRACHYTHERAPY IMPLANT;  Surgeon: MaAlexis FrockMD;  [facility];  Laterality: N/A;   SPACE OAR INSTILLATION N/A 08/10/2019   Procedure: SPACE OAR INSTILLATION;  Surgeon: MaAlexis FrockMD;  [facility] Service: [facility];  Laterality: N/A;   TONSILLECTOMY     childhood    Social History   Tobacco Use   Smoking status: Never   Smokeless tobacco: Never  Vaping Use   Vaping Use: Never used  Substance Use Topics   Alcohol use: Yes    Alcohol/week: 0.0 standard drinks    Comment: 1-2 drinks per week.   Drug use: No    Family History  Problem Relation Age of Onset   Lymphoma Mother    Hypertension Mother    Arthritis Mother    Cancer Mother    CAD Father    Heart disease Father    Heart attack Father    Arthritis Sister    Heart disease Sister    Hypertension Brother     Allergies  Allergen Reactions   Prednisone     Blurred vision     Medication list has been reviewed and updated.  Current Outpatient Medications on File Prior to Visit  Medication Sig Dispense Refill   aspirin EC 81 MG tablet Take 81 mg by mouth at bedtime.      furosemide (LASIX) 20 MG tablet TAKE ONE TABLET BY MOUTH DAILY AS NEEDED FOR SWELLING OF LEGS 90 tablet 0   loratadine (CLARITIN) 10 MG tablet Take 10 mg by mouth daily.     meloxicam (MOBIC) 7.5 MG tablet TAKE 1 TABLET (7.5 MG TOTAL) BY MOUTH DAILY. USE AS NEEDED FOR ARTHRITIS 30 tablet 6   mesalamine (LIALDA) 1.2 g EC tablet Take 1.2 g by mouth in the morning and at bedtime.      methotrexate (RHEUMATREX) 2.5 MG tablet Take 6 tablets (15 mg total) by mouth once a week. Caution:Chemotherapy. Protect from light. 20 tablet 0   metoprolol tartrate (LOPRESSOR) 25 MG tablet TAKE 1/2 TABLET BY MOUTH DAILY 45 tablet 3   montelukast (SINGULAIR) 10 MG tablet TAKE ONE TABLET BY MOUTH  EVERY NIGHT AT BEDTIME 90 tablet 3   Multiple Vitamins-Minerals (CENTRUM SILVER PO) Take 1 tablet by mouth daily.      nitroGLYCERIN (NITROSTAT) 0.4 MG SL tablet Place 1 tablet (0.4 mg total) under the tongue every 5 (five) minutes as needed for chest pain. 25 tablet 3   rosuvastatin (CRESTOR) 20 MG tablet Take 1 tablet (20 mg total) by mouth daily. 90 tablet 3   tamsulosin (FLOMAX) 0.4 MG CAPS capsule Take 0.4 mg by mouth daily.     triamcinolone ointment (KENALOG) 0.5 % Apply 1 application topically 2 (two) times daily.  Use as needed for skin rash 60 g 1   No current facility-administered medications on file prior to visit.    Review of Systems:  As per HPI- otherwise negative.   Physical Examination: Vitals:   05/20/22 1343  BP: 132/70  Pulse: 60  Resp: 18  Temp: 97.6 F (36.4 C)  SpO2: 97%   Vitals:   05/20/22 1343  Weight: 186 lb (84.4 kg)  Height: 5' 4"  (1.626 m)   Body mass index is 31.93 kg/m. Ideal Body Weight: Weight in (lb) to have BMI = 25: 145.3  GEN: no acute distress.   Obese, looks well HEENT: Atraumatic, Normocephalic.  Ears and Nose: No external deformity. CV: RRR, No M/G/R. No JVD. No thrill. No extra heart sounds. PULM: CTA B, no wheezes, crackles, rhonchi. No retractions. No resp. distress. No accessory muscle use. EXTR: No c/c/e PSYCH: Normally interactive. Conversant.  There is a firm and slightly tender area behind the right nipple. About 2cm in diameter No other masses or LAD is noted No redness or heat   Assessment and Plan: Subareolar mass of right breast - Plan: MM DIAG BREAST TOMO UNI RIGHT, US BREAST COMPLETE UNI RIGHT INC AXILLA Pt seen today with breast mass in male Referral for mammo and [place] at breast center Asked him to let me know if sx are changing or getting worse in the meantime   Signed Lamar Blinks, MD

## 2022-05-20 ENCOUNTER — Encounter: Payer: Self-pay | Admitting: Family Medicine

## 2022-05-20 ENCOUNTER — Ambulatory Visit (INDEPENDENT_AMBULATORY_CARE_PROVIDER_SITE_OTHER): Payer: Medicare HMO | Admitting: Family Medicine

## 2022-05-20 VITALS — BP 132/70 | HR 60 | Temp 97.6°F | Resp 18 | Ht 64.0 in | Wt 186.0 lb

## 2022-05-20 DIAGNOSIS — N6341 Unspecified lump in right breast, subareolar: Secondary | ICD-10-CM | POA: Diagnosis not present

## 2022-05-20 NOTE — Patient Instructions (Addendum)
Great to see you again today- we will get this breast mass assessed asap Please call the [facility] tomorrow if they have not called your already   Address: [address], [place], [place] [address] Phone: [phone]  Please let me know if anything is changing or getting worse

## 2022-05-21 ENCOUNTER — Other Ambulatory Visit: Payer: Self-pay | Admitting: Family Medicine

## 2022-05-21 DIAGNOSIS — N6341 Unspecified lump in right breast, subareolar: Secondary | ICD-10-CM

## 2022-05-24 ENCOUNTER — Ambulatory Visit: Payer: Medicare HMO | Admitting: Family Medicine

## 2022-05-26 ENCOUNTER — Ambulatory Visit: Payer: Self-pay | Admitting: Neurology

## 2022-05-27 ENCOUNTER — Ambulatory Visit
Admission: RE | Admit: 2022-05-27 | Discharge: 2022-05-27 | Disposition: A | Discharge status: Home / Self Care | Payer: Medicare HMO | Source: Ambulatory Visit | Attending: Family Medicine | Admitting: Family Medicine

## 2022-05-27 DIAGNOSIS — N6341 Unspecified lump in right breast, subareolar: Secondary | ICD-10-CM

## 2022-05-27 DIAGNOSIS — N62 Hypertrophy of breast: Secondary | ICD-10-CM | POA: Diagnosis not present

## 2022-05-28 ENCOUNTER — Encounter: Payer: Self-pay | Admitting: Family Medicine

## 2022-06-21 ENCOUNTER — Other Ambulatory Visit: Payer: Self-pay | Admitting: Family Medicine

## 2022-06-21 DIAGNOSIS — R600 Localized edema: Secondary | ICD-10-CM

## 2022-07-09 DIAGNOSIS — M0609 Rheumatoid arthritis without rheumatoid factor, multiple sites: Secondary | ICD-10-CM | POA: Diagnosis not present

## 2022-08-05 DIAGNOSIS — L570 Actinic keratosis: Secondary | ICD-10-CM | POA: Diagnosis not present

## 2022-08-05 DIAGNOSIS — L280 Lichen simplex chronicus: Secondary | ICD-10-CM | POA: Diagnosis not present

## 2022-08-20 ENCOUNTER — Encounter (INDEPENDENT_AMBULATORY_CARE_PROVIDER_SITE_OTHER): Payer: Medicare HMO | Admitting: Family Medicine

## 2022-08-20 DIAGNOSIS — R5381 Other malaise: Secondary | ICD-10-CM | POA: Diagnosis not present

## 2022-08-20 DIAGNOSIS — R5383 Other fatigue: Secondary | ICD-10-CM | POA: Diagnosis not present

## 2022-08-20 MED ORDER — DOXYCYCLINE HYCLATE 100 MG PO CAPS: 100.0000 mg | ORAL_CAPSULE | Freq: Two times a day (BID) | ORAL | 0 refills | Status: DC

## 2022-08-20 NOTE — Addendum Note (Signed)
Addended by: Lamar Blinks C on: 08/20/2022 05:38 PM   Modules accepted: Orders

## 2022-08-20 NOTE — Telephone Encounter (Signed)
Called pt back - he got sick 2 days ago- he has noted a subjective fever, chills, feeling tired, mild cough No ST  No abd sx except he vomited once- no abd pain He notes a headache and some body aches He tested - for covid  Called in doxy- recommended that he re-test for covid in 1-2 days and keep me posted about how he is doing   Please see the MyChart message reply(ies) for my assessment and plan.  The patient gave consent for this Medical Advice Message and is aware that it may result in a bill to their insurance company as well as the possibility that this may result in a co-payment or deductible. They are an established patient, but are not seeking medical advice exclusively about a problem treated during an in person or video visit in the last 7 days. I did not recommend an in person or video visit within 7 days of my reply.  I spent a total of 7 minutes cumulative time within 7 days through [employer] Lamar Blinks, MD

## 2022-08-22 ENCOUNTER — Encounter: Payer: Self-pay | Admitting: Family Medicine

## 2022-08-30 DIAGNOSIS — C61 Malignant neoplasm of prostate: Secondary | ICD-10-CM | POA: Diagnosis not present

## 2022-09-01 ENCOUNTER — Encounter: Payer: Self-pay | Admitting: Family Medicine

## 2022-09-04 NOTE — Progress Notes (Unsigned)
[place] at [facility] [address], [place], [place] [address] [phone]  Date:  09/08/2022   Name:  Tyler Estrada   DOB:  Nov 18, 1956   MRN:  [id number]  PCP:  Darreld Mclean, MD    Chief Complaint: No chief complaint on file.   History of Present Illness:  Tyler Estrada is a 66 y.o. very pleasant male patient who presents with the following:  Pt seen today to discuss gynecomastia  -history of hypertension, prediabetes, hyperlipidemia, sleep apnea treated with oral appliance, vitamin D deficiency He underwent a C4-7 decompression in April of 2022 and has done overall quite well from that standpoint He also has rheumatoid arthritis using methotrexate per rheumatology We discussed this back in June and did a mammogram which was benign  IMPRESSION: 1. Unilateral RIGHT-sided gynecomastia, mixed nodular and dendritic type, mild in degree, corresponding to the palpable area of concern. 2. No evidence of malignancy within either breast.  Patient Active Problem List   Diagnosis Date Noted   Pre-op evaluation 12/01/2020   Essential hypertension 12/01/2020   Cervical spondylosis with myelopathy and radiculopathy 03/26/2020   Prostate cancer ([place]) 04/12/2019   Obese 12/01/2018   S/P left UKR 11/30/2018   H/O pericarditis    Elevated troponin 11/19/2015   Dyslipidemia, goal LDL below 70    CAD S/P percutaneous coronary angioplasty 11/18/2015   Chest pain 11/18/2015   Iron deficiency anemia due to chronic blood loss 09/10/2015   Ulcerative colitis with complication ([place]) 56/97/9480    Past Medical History:  Diagnosis Date   Arthritis    knees, hips, hands   Complication of anesthesia    Woke up during hip replacement, had surgery since with no problems   Coronary artery disease    a. s/p DES to RCA in 2008;  b. 10/2015 Cath: LM nl, LAD 50ost/p, OM1/2 min irregs, RCA 85mISR, 339m ISR, EF 65%.   Echocardiogram     Echo 11/16: EF 60-65, normal wall motion, grade 1 diastolic dysfunction   Grade I diastolic dysfunction 1116/55/3748 ECHO   Hyperlipidemia    Iron deficiency anemia due to chronic blood loss 09/10/2015   Melanoma (HCC)    Nose   Pericarditis    a. 10/2015->colchicine.   Prostate cancer ([facility]   Ulcerative colitis with complication ([facility]9/2/70/7867  Past Surgical History:  Procedure Laterality Date   ANTERIOR CERVICAL DECOMP/DISCECTOMY FUSION N/A 03/26/2020   Procedure: Cervical Four-Five, Cervical Five-Six, Cervical Six-Seven Anterior Cervical Decompression/Discectomy/Fusion;  Surgeon: CaAshok PallMD;  [facility]: [facility];  Laterality: N/A;  3C   CARDIAC CATHETERIZATION N/A 11/18/2015   Procedure: Left Heart Cath and Coronary Angiography;  Surgeon: MiSherren MochaMD;  [facility];  Service: Cardiovascular;  Laterality: N/A;   CARPAL TUNNEL RELEASE Right 03/26/2020   Procedure: CARPAL TUNNEL RELEASE;  Surgeon: CaAshok PallMD;  [facility]: Neurosurgery;  Laterality: Right;   CERVICAL SPINE SURGERY     COLONOSCOPY  2019   CORONARY ANGIOPLASTY WITH STENT PLACEMENT  2008   a. Cordis stent to RCA in 2008 2.7572m 69m72mHIP ARTHROPLASTY Right    MOHS SURGERY     PARTIAL KNEE ARTHROPLASTY Left 11/30/2018   Procedure: LEFT UNICOMPARTMENTAL KNEE Medially;  Surgeon: OlinParalee Cancel;  [facility];  Service: Orthopedics;  Laterality: Left;  70 mins with block   RADIOACTIVE SEED  IMPLANT N/A 08/10/2019   Procedure: RADIOACTIVE SEED IMPLANT/BRACHYTHERAPY IMPLANT;  Surgeon: Alexis Frock, MD;  [facility];  Service: Urology;  Laterality: N/A;   SPACE OAR INSTILLATION N/A 08/10/2019   Procedure: SPACE OAR INSTILLATION;  Surgeon: Alexis Frock, MD;  Location: [facility];  Service: Urology;  Laterality: N/A;   TONSILLECTOMY     childhood    Social History   Tobacco Use   Smoking status:  Never   Smokeless tobacco: Never  Vaping Use   Vaping Use: Never used  Substance Use Topics   Alcohol use: Yes    Alcohol/week: 0.0 standard drinks of alcohol    Comment: 1-2 drinks per week.   Drug use: No    Family History  Problem Relation Age of Onset   Lymphoma Mother    Hypertension Mother    Arthritis Mother    Cancer Mother    CAD Father    Heart disease Father    Heart attack Father    Arthritis Sister    Heart disease Sister    Hypertension Brother     Allergies  Allergen Reactions   Prednisone     Blurred vision     Medication list has been reviewed and updated.  Current Outpatient Medications on File Prior to Visit  Medication Sig Dispense Refill   aspirin EC 81 MG tablet Take 81 mg by mouth at bedtime.      doxycycline (VIBRAMYCIN) 100 MG capsule Take 1 capsule (100 mg total) by mouth 2 (two) times daily. 20 capsule 0   furosemide (LASIX) 20 MG tablet TAKE ONE TABLET BY MOUTH DAILY AS NEEDED FOR SWELLING OF LEG 90 tablet 0   loratadine (CLARITIN) 10 MG tablet Take 10 mg by mouth daily.     meloxicam (MOBIC) 7.5 MG tablet TAKE 1 TABLET (7.5 MG TOTAL) BY MOUTH DAILY. USE AS NEEDED FOR ARTHRITIS 30 tablet 6   mesalamine (LIALDA) 1.2 g EC tablet Take 1.2 g by mouth in the morning and at bedtime.      methotrexate (RHEUMATREX) 2.5 MG tablet Take 6 tablets (15 mg total) by mouth once a week. Caution:Chemotherapy. Protect from light. 20 tablet 0   metoprolol tartrate (LOPRESSOR) 25 MG tablet TAKE 1/2 TABLET BY MOUTH DAILY 45 tablet 3   montelukast (SINGULAIR) 10 MG tablet TAKE ONE TABLET BY MOUTH EVERY NIGHT AT BEDTIME 90 tablet 3   Multiple Vitamins-Minerals (CENTRUM SILVER PO) Take 1 tablet by mouth daily.      nitroGLYCERIN (NITROSTAT) 0.4 MG SL tablet Place 1 tablet (0.4 mg total) under the tongue every 5 (five) minutes as needed for chest pain. 25 tablet 3   rosuvastatin (CRESTOR) 20 MG tablet Take 1 tablet (20 mg total) by mouth daily. 90 tablet 3    tamsulosin (FLOMAX) 0.4 MG CAPS capsule Take 0.4 mg by mouth daily.     triamcinolone ointment (KENALOG) 0.5 % Apply 1 application topically 2 (two) times daily. Use as needed for skin rash 60 g 1   No current facility-administered medications on file prior to visit.    Review of Systems:  As per HPI- otherwise negative.   Physical Examination: There were no vitals filed for this visit. There were no vitals filed for this visit. There is no height or weight on file to calculate BMI. Ideal Body Weight:    GEN: no acute distress. HEENT: Atraumatic, Normocephalic.  Ears and Nose: No external deformity. CV: RRR, No M/G/R. No JVD. No thrill.  No extra heart sounds. PULM: CTA B, no wheezes, crackles, rhonchi. No retractions. No resp. distress. No accessory muscle use. ABD: S, NT, ND, +BS. No rebound. No HSM. EXTR: No c/c/e PSYCH: Normally interactive. Conversant.    Assessment and Plan: ***  Signed Lamar Blinks, MD

## 2022-09-07 DIAGNOSIS — C61 Malignant neoplasm of prostate: Secondary | ICD-10-CM | POA: Diagnosis not present

## 2022-09-08 ENCOUNTER — Encounter: Payer: Self-pay | Admitting: Family Medicine

## 2022-09-08 ENCOUNTER — Ambulatory Visit (INDEPENDENT_AMBULATORY_CARE_PROVIDER_SITE_OTHER): Payer: Medicare HMO | Admitting: Family Medicine

## 2022-09-08 VITALS — BP 138/64 | HR 54 | Temp 97.6°F | Ht 64.0 in | Wt 185.2 lb

## 2022-09-08 DIAGNOSIS — N62 Hypertrophy of breast: Secondary | ICD-10-CM

## 2022-09-22 ENCOUNTER — Other Ambulatory Visit: Payer: Self-pay | Admitting: Family Medicine

## 2022-09-22 DIAGNOSIS — R600 Localized edema: Secondary | ICD-10-CM

## 2022-10-05 ENCOUNTER — Other Ambulatory Visit (HOSPITAL_BASED_OUTPATIENT_CLINIC_OR_DEPARTMENT_OTHER): Payer: Self-pay

## 2022-10-05 MED ORDER — COMIRNATY 30 MCG/0.3ML IM SUSY
PREFILLED_SYRINGE | INTRAMUSCULAR | 0 refills | Status: DC
  Filled 2022-10-05: qty 0.3, 1d supply, fill #0

## 2022-10-05 MED ORDER — FLUAD QUADRIVALENT 0.5 ML IM PRSY
PREFILLED_SYRINGE | INTRAMUSCULAR | 0 refills | Status: DC
  Filled 2022-10-05: qty 0.5, 1d supply, fill #0

## 2022-10-07 ENCOUNTER — Encounter: Payer: Self-pay | Admitting: Family Medicine

## 2022-10-07 NOTE — Progress Notes (Signed)
[place] [facility] [address] [place], [place] [address] [phone]  Date:  10/11/2022   Name:  Tyler Estrada   DOB:  02/06/1956   MRN:  [id number]  PCP:  Darreld Mclean, MD    Chief Complaint: Spasms (Pt states that he has spoken to Dr. Harrington Challenger about this in the past and he was told that it was not a heart issue. And that if it continued he would need to see PCP, as May be something skeletal./)   History of Present Illness:  Tyler Estrada is a 66 y.o. very pleasant male patient who presents with the following:  Patient seen today with concern of "heart spasms" -intermittent pains in the left lower chest He has noted this symptoms off and on for at least 2 years Most recent visit with myself was in September/last month for concern of gynecomastia History of hypertension, CAD with RCA stent 2008, prediabetes, hyperlipidemia, sleep apnea treated with oral appliance, vitamin D deficiency. He underwent a C4-7 decompression in April of 2022 and has done overall quite well from that standpoint He also has rheumatoid arthritis using methotrexate per rheumatology  Reviewed Dr. Alan Ripper cardiology note from March, he had been using colchicine for pericarditis.  However, patient relates that Dr. Harrington Challenger suspects his symptoms are actually not pericarditis.  He reports that she recommended he investigate possible noncardiac causes of this pain  The pain will be under the left breast area Sharp and stabbing in character Feels better if he sits up/ leans forward Will come and go pretty quickly-the pain will last few seconds to a minute at the longest duration.  Might occur 2-3x a day when it is happening Colchicine does seem to help ; he took it for about a week recently  He had this most recently 7- 10 days ago - not there currently Non exertional   He has noted a bit more burping, etc recently but no other sx noted  No palp noted but  he does note that his smart watch may say his heart is irregular when he is experiencing the pain  He did a stress test in March of this year    The study is normal. The study is low risk.   No ST deviation was noted.   LV perfusion is normal. There is no evidence of ischemia. There is no evidence of infarction.   Left ventricular function is abnormal. Global function is mildly reduced. Nuclear stress EF: 51 %. The left ventricular ejection fraction is mildly decreased (45-54%). End diastolic cavity size is normal. End systolic cavity size is normal.   Prior study available for comparison from 11/02/2010.   Fair exercise capacity, achieved 7.0 METS   Peak heart rate 150 bpm (96% of max age predicted HR)   Normal BP response to exercise Fixed inferior perfusion defect with normal wall motion, consistent with artifact Low risk study    Patient Active Problem List   Diagnosis Date Noted   Pre-op evaluation 12/01/2020   Essential hypertension 12/01/2020   Cervical spondylosis with myelopathy and radiculopathy 03/26/2020   Prostate cancer ([place]) 04/12/2019   Obese 12/01/2018   S/P left UKR 11/30/2018   H/O pericarditis    Elevated troponin 11/19/2015   Dyslipidemia, goal LDL below 70    CAD S/P percutaneous coronary angioplasty 11/18/2015   Chest pain 11/18/2015   Iron deficiency anemia due to chronic blood loss 09/10/2015   Ulcerative  colitis with complication ([place]) 45/80/9983    Past Medical History:  Diagnosis Date   Arthritis    knees, hips, hands   Complication of anesthesia    Woke up during hip replacement, had surgery since with no problems   Coronary artery disease    a. s/p DES to RCA in 2008;  b. 10/2015 Cath: LM nl, LAD 50ost/p, OM1/2 min irregs, RCA 72mISR, 315m ISR, EF 65%.   Echocardiogram    Echo 11/16: EF 60-65, normal wall motion, grade 1 diastolic dysfunction   Grade I diastolic dysfunction 1138/25/0539 ECHO   Hyperlipidemia    Iron deficiency anemia due to  chronic blood loss 09/10/2015   Melanoma (HCC)    Nose   Pericarditis    a. 10/2015->colchicine.   [facility]   Ulcerative colitis with complication ([facility]9/7/67/3419  Past Surgical History:  Procedure Laterality Date   ANTERIOR CERVICAL DECOMP/DISCECTOMY FUSION N/A 03/26/2020   Procedure: Cervical Four-Five, Cervical Five-Six, Cervical Six-Seven Anterior Cervical Decompression/Discectomy/Fusion;  Surgeon: CaAshok PallMD;  [facility]: [facility];  Laterality: N/A;  3C   CARDIAC CATHETERIZATION N/A 11/18/2015   Procedure: Left Heart Cath and Coronary Angiography;  Surgeon: MiSherren MochaMD;  [facility];  Service: Cardiovascular;  Laterality: N/A;   CARPAL TUNNEL RELEASE Right 03/26/2020   Procedure: CARPAL TUNNEL RELEASE;  Surgeon: CaAshok PallMD;  [facility]: [facility]surgery;  Laterality: Right;   CERVICAL SPINE SURGERY     COLONOSCOPY  2019   CORONARY ANGIOPLASTY WITH STENT PLACEMENT  2008   a. Cordis stent to RCA in 2008 2.7547m 47m94mHIP ARTHROPLASTY Right    MOHS SURGERY     PARTIAL KNEE ARTHROPLASTY Left 11/30/2018   Procedure: LEFT UNICOMPARTMENTAL KNEE Medially;  Surgeon: OlinParalee Cancel;  Location: [facility];  Service: Orthopedics;  Laterality: Left;  70 mins with block   RADIOACTIVE SEED IMPLANT N/A 08/10/2019   Procedure: RADIOACTIVE SEED IMPLANT/BRACHYTHERAPY IMPLANT;  Surgeon: MannAlexis Frock;  [facility];  Laterality: N/A;   SPACE OAR INSTILLATION N/A 08/10/2019   Procedure: SPACE OAR INSTILLATION;  Surgeon: MannAlexis Frock;  [facility];  Laterality: N/A;   TONSILLECTOMY     childhood    Social History   Tobacco Use   Smoking status: Never   Smokeless tobacco: Never  Vaping Use   Vaping Use: Never used  Substance Use Topics   Alcohol use: Yes    Alcohol/week: 0.0 standard drinks of alcohol    Comment:  1-2 drinks per week.   Drug use: No    Family History  Problem Relation Age of Onset   Lymphoma Mother    Hypertension Mother    Arthritis Mother    Cancer Mother    CAD Father    Heart disease Father    Heart attack Father    Arthritis Sister    Heart disease Sister    Hypertension Brother     Allergies  Allergen Reactions   Prednisone     Blurred vision     Medication list has been reviewed and updated.  Current Outpatient Medications on File Prior to Visit  Medication Sig Dispense Refill   aspirin EC 81 MG tablet Take 81 mg by mouth at bedtime.      furosemide (LASIX) 20 MG tablet TAKE 1 TABLET BY MOUTH DAILY AS NEEDED  FOR SWELLING OF LEG 90 tablet 0   influenza vaccine adjuvanted (FLUAD QUADRIVALENT) 0.5 ML injection Inject into the muscle. 0.5 mL 0   loratadine (CLARITIN) 10 MG tablet Take 10 mg by mouth daily.     meloxicam (MOBIC) 7.5 MG tablet TAKE 1 TABLET (7.5 MG TOTAL) BY MOUTH DAILY. USE AS NEEDED FOR ARTHRITIS 30 tablet 6   mesalamine (LIALDA) 1.2 g EC tablet Take 1.2 g by mouth in the morning and at bedtime.      methotrexate (RHEUMATREX) 2.5 MG tablet Take 6 tablets (15 mg total) by mouth once a week. Caution:Chemotherapy. Protect from light. 20 tablet 0   metoprolol tartrate (LOPRESSOR) 25 MG tablet TAKE 1/2 TABLET BY MOUTH DAILY 45 tablet 3   montelukast (SINGULAIR) 10 MG tablet TAKE ONE TABLET BY MOUTH EVERY NIGHT AT BEDTIME 90 tablet 3   Multiple Vitamins-Minerals (CENTRUM SILVER PO) Take 1 tablet by mouth daily.      nitroGLYCERIN (NITROSTAT) 0.4 MG SL tablet Place 1 tablet (0.4 mg total) under the tongue every 5 (five) minutes as needed for chest pain. 25 tablet 3   rosuvastatin (CRESTOR) 20 MG tablet Take 1 tablet (20 mg total) by mouth daily. 90 tablet 3   tamsulosin (FLOMAX) 0.4 MG CAPS capsule Take 0.4 mg by mouth daily.     triamcinolone ointment (KENALOG) 0.5 % Apply 1 application topically 2 (two) times daily. Use as needed for skin rash 60 g 1    No current facility-administered medications on file prior to visit.    Review of Systems:  As per HPI- otherwise negative.   Physical Examination: Vitals:   10/11/22 0958  BP: 120/80  Pulse: 60  Resp: 18  Temp: 97.8 F (36.6 C)  SpO2: 98%   Vitals:   10/11/22 0958  Weight: 182 lb 9.6 oz (82.8 kg)  Height: 5' 4"  (1.626 m)   Body mass index is 31.34 kg/m. Ideal Body Weight: Weight in (lb) to have BMI = 25: 145.3  GEN: no acute distress.  Mildly obese, looks well HEENT: Atraumatic, Normocephalic.  Ears and Nose: No external deformity. CV: RRR, No M/G/R. No JVD. No thrill. No extra heart sounds. PULM: CTA B, no wheezes, crackles, rhonchi. No retractions. No resp. distress. No accessory muscle use. ABD: S, NT, ND, +BS. No rebound. No HSM. EXTR: No c/c/e PSYCH: Normally interactive. Conversant.  I am able to somewhat reproduce tenderness by pressing on his left anterior ribs.  No rash or swelling  EKG: Normal sinus rhythm, no change from February  Assessment and Plan: Left-sided chest pain - Plan: LONG TERM MONITOR (3-14 DAYS), DG Ribs Unilateral W/Chest Left, CT CARDIAC SCORING (SELF PAY ONLY), EKG 12-Lead Patient seen today with concern of left-sided chest pain, intermittent and nonexertional.  Patient reports this has been evaluated fairly extensively.  Working diagnosis has been pericarditis, but patient notes his cardiologist advised him this could be musculoskeletal.  We decided to go ahead with rib films today.  He also notes his smart watch reports irregular heartbeats sometimes when he is experiencing the pain.  Ordered a Zio patch.  Also ordered a coronary calcium to take a look at current plaque situation   Signed Lamar Blinks, MD

## 2022-10-11 ENCOUNTER — Ambulatory Visit (INDEPENDENT_AMBULATORY_CARE_PROVIDER_SITE_OTHER): Payer: Medicare HMO | Admitting: Family Medicine

## 2022-10-11 ENCOUNTER — Ambulatory Visit (INDEPENDENT_AMBULATORY_CARE_PROVIDER_SITE_OTHER): Payer: Medicare HMO

## 2022-10-11 ENCOUNTER — Ambulatory Visit
Admission: RE | Admit: 2022-10-11 | Discharge: 2022-10-11 | Disposition: A | Discharge status: Home / Self Care | Payer: Medicare HMO | Source: Ambulatory Visit | Attending: Family Medicine | Admitting: Family Medicine

## 2022-10-11 ENCOUNTER — Other Ambulatory Visit: Payer: Self-pay | Admitting: Family Medicine

## 2022-10-11 ENCOUNTER — Ambulatory Visit (HOSPITAL_BASED_OUTPATIENT_CLINIC_OR_DEPARTMENT_OTHER)
Admission: RE | Admit: 2022-10-11 | Discharge: 2022-10-11 | Disposition: A | Discharge status: Home / Self Care | Payer: Medicare HMO | Source: Ambulatory Visit | Attending: Family Medicine | Admitting: Family Medicine

## 2022-10-11 ENCOUNTER — Encounter: Payer: Self-pay | Admitting: Family Medicine

## 2022-10-11 VITALS — BP 120/80 | HR 60 | Temp 97.8°F | Resp 18 | Ht 64.0 in | Wt 182.6 lb

## 2022-10-11 DIAGNOSIS — R079 Chest pain, unspecified: Secondary | ICD-10-CM

## 2022-10-11 DIAGNOSIS — I499 Cardiac arrhythmia, unspecified: Secondary | ICD-10-CM

## 2022-10-11 DIAGNOSIS — K8020 Calculus of gallbladder without cholecystitis without obstruction: Secondary | ICD-10-CM

## 2022-10-11 NOTE — Progress Notes (Unsigned)
Enrolled for [employer] to mail a ZIO XT long term holter monitor to the patients address on file.   Dr. Harrington Challenger to read.

## 2022-10-12 DIAGNOSIS — M2550 Pain in unspecified joint: Secondary | ICD-10-CM | POA: Diagnosis not present

## 2022-10-12 DIAGNOSIS — K5180 Other ulcerative colitis without complications: Secondary | ICD-10-CM | POA: Diagnosis not present

## 2022-10-12 DIAGNOSIS — M0609 Rheumatoid arthritis without rheumatoid factor, multiple sites: Secondary | ICD-10-CM | POA: Diagnosis not present

## 2022-10-12 DIAGNOSIS — C61 Malignant neoplasm of prostate: Secondary | ICD-10-CM | POA: Diagnosis not present

## 2022-10-14 ENCOUNTER — Encounter: Payer: Self-pay | Admitting: Family Medicine

## 2022-10-14 ENCOUNTER — Ambulatory Visit (HOSPITAL_BASED_OUTPATIENT_CLINIC_OR_DEPARTMENT_OTHER)
Admission: RE | Admit: 2022-10-14 | Discharge: 2022-10-14 | Disposition: A | Discharge status: Home / Self Care | Payer: Medicare HMO | Source: Ambulatory Visit | Attending: Family Medicine | Admitting: Family Medicine

## 2022-10-14 DIAGNOSIS — K8020 Calculus of gallbladder without cholecystitis without obstruction: Secondary | ICD-10-CM | POA: Insufficient documentation

## 2022-10-15 ENCOUNTER — Ambulatory Visit
Admission: RE | Admit: 2022-10-15 | Discharge: 2022-10-15 | Disposition: A | Discharge status: Home / Self Care | Payer: Medicare HMO | Source: Ambulatory Visit | Attending: Urology | Admitting: Urology

## 2022-10-15 ENCOUNTER — Encounter: Payer: Self-pay | Admitting: Urology

## 2022-10-15 DIAGNOSIS — T50905A Adverse effect of unspecified drugs, medicaments and biological substances, initial encounter: Secondary | ICD-10-CM | POA: Diagnosis not present

## 2022-10-15 DIAGNOSIS — C61 Malignant neoplasm of prostate: Secondary | ICD-10-CM

## 2022-10-15 DIAGNOSIS — N62 Hypertrophy of breast: Secondary | ICD-10-CM | POA: Diagnosis not present

## 2022-10-15 NOTE — Progress Notes (Addendum)
Reconsult telephone nursing interview for a 66 y.o. gentleman with gynecomastia from ADT treatment for Gleason 4+5 prostate cancer.  I verified patient's identity and began nursing interview. Patient is doing well. Nko issues reported at this time.  Meaningful use complete. Flomax stopped by patient. Urology appt- X6 months at [facility] w/ Dr. Tresa Moore.  Patient aware of his 10:00am-10/15/22 telephone appt w/ Ashlyn Bruning PA-C. I left my extension [phone] in case patient needs anything.  This concludes the nursing interview. Patient contact [phone]   Leandra Kern, LPN

## 2022-10-15 NOTE — Progress Notes (Signed)
Radiation Oncology         (336) [phone] ________________________________  Outpatient Consultation - Conducted via Telephone due to current COVID-19 concerns for limiting patient exposure  Name: Tyler Estrada MRN: [id number]  Date: 10/15/2022  DOB: April 05, 1956  ZD:[id number], Gay Filler, MD  Alexis Frock, MD   REFERRING PHYSICIAN: Alexis Frock, MD  DIAGNOSIS: 66 y.o. gentleman with gynecomastia from LT-ADT for Gleason 4+5 prostate cancer    ICD-10-CM   1. Malignant neoplasm of prostate ([place])  C61     2. Drug-induced gynecomastia  N62    T50.905A       HISTORY OF PRESENT ILLNESS: Tyler Estrada is a 66 y.o. male. In summary, he was initially referred to Dr. Tresa Moore for a significantly elevated PSA of 94.7. A repeat PSA obtained a month later showed a drop but remained elevated at 74.7. He proceeded to prostate biopsy on 03/27/19, and pathology showed 12 out of 12 positive cores with a maximum Gleason score of 4+5. Staging CT A/P and bone scan in 03/2019 were both negative for metastatic disease. We met the patient in consultation on 04/24/19. At that time, he opted to proceed with LT-ADT in combination with an upfront brachytherapy boost followed by a 5 week course of daily IMRT.  He completed radiation therapy on 10/12/19 and received his final 67-monthdose of ADT on 12/16/20 and his PSA has remained undetectable since completing the ADT. While on ADT, he developed progressive right breast gynecomastia with breast/nipple sensitivity. This persisted even after completing ADT and recovery of his testosterone to 181 in 08/2022. He did undergo bilateral breast mammogram and right breast ultrasound on 05/27/22 for evaluation, and this showed: unilateral right-sided gynecomastia, mixed lobular and dendritic type, mild in degree; no evidence of malignancy in either breast.  The patient has kindly been referred today for discussion of potential radiation treatment options for his persistent  gynecomastia.   PREVIOUS RADIATION THERAPY: No  PAST MEDICAL HISTORY:  Past Medical History:  Diagnosis Date   Arthritis    knees, hips, hands   Complication of anesthesia    Woke up during hip replacement, had surgery since with no problems   Coronary artery disease    a. s/p DES to RCA in 2008;  b. 10/2015 Cath: LM nl, LAD 50ost/p, OM1/2 min irregs, RCA 232mSR, 3080mISR, EF 65%.   Echocardiogram    Echo 11/16: EF 60-65, normal wall motion, grade 1 diastolic dysfunction   Grade I diastolic dysfunction 11/74/25/9563ECHO   Hyperlipidemia    Iron deficiency anemia due to chronic blood loss 09/10/2015   Melanoma (HCC)    Nose   Pericarditis    a. 10/2015->colchicine.   [place] cancer ([facility]  Ulcerative colitis with complication ([facility]/28/75/6433   PAST SURGICAL HISTORY: Past Surgical History:  Procedure Laterality Date   ANTERIOR CERVICAL DECOMP/DISCECTOMY FUSION N/A 03/26/2020   Procedure: Cervical Four-Five, Cervical Five-Six, Cervical Six-Seven Anterior Cervical Decompression/Discectomy/Fusion;  Surgeon: CabAshok PallD;  [facility];  Laterality: N/A;  3C   CARDIAC CATHETERIZATION N/A 11/18/2015   Procedure: Left Heart Cath and Coronary Angiography;  Surgeon: MicSherren MochaD;  Location[facility] [place] [facility];  Service: Cardiovascular;  Laterality: N/A;   CARPAL TUNNEL RELEASE Right 03/26/2020   Procedure: CARPAL TUNNEL RELEASE;  Surgeon: CabAshok PallD;  [facility];  Laterality: Right;   CERVICAL SPINE SURGERY     COLONOSCOPY  2019  CORONARY ANGIOPLASTY WITH STENT PLACEMENT  2008   a. Cordis stent to RCA in 2008 2.65m x 143m  HIP ARTHROPLASTY Right    MOHS SURGERY     PARTIAL KNEE ARTHROPLASTY Left 11/30/2018   Procedure: LEFT UNICOMPARTMENTAL KNEE Medially;  Surgeon: OlParalee CancelMD;  [facility];  Service: Orthopedics;  Laterality: Left;  70 mins with block   RADIOACTIVE SEED IMPLANT N/A 08/10/2019    Procedure: RADIOACTIVE SEED IMPLANT/BRACHYTHERAPY IMPLANT;  Surgeon: MaAlexis FrockMD;  [facility];  Laterality: N/A;   SPACE OAR INSTILLATION N/A 08/10/2019   Procedure: SPACE OAR INSTILLATION;  Surgeon: MaAlexis FrockMD;  [facility];  Laterality: N/A;   TONSILLECTOMY     childhood    FAMILY HISTORY:  Family History  Problem Relation Age of Onset   Lymphoma Mother    Hypertension Mother    Arthritis Mother    Cancer Mother    CAD Father    Heart disease Father    Heart attack Father    Arthritis Sister    Heart disease Sister    Hypertension Brother     SOCIAL HISTORY:  Social History   Socioeconomic History   Marital status: Married    Spouse name: Not on file   Number of children: 0   Years of education: Not on file   Highest education level: Not on file  Occupational History    Comment: full time  Tobacco Use   Smoking status: Never   Smokeless tobacco: Never  Vaping Use   Vaping Use: Never used  Substance and Sexual Activity   Alcohol use: Yes    Alcohol/week: 0.0 standard drinks of alcohol    Comment: 1-2 drinks per week.   Drug use: No   Sexual activity: Not on file  Other Topics Concern   Not on file  Social History Narrative   Not on file   Social Determinants of Health   Financial Resource Strain: Not on file  Food Insecurity: Not on file  Transportation Needs: Not on file  Physical Activity: Not on file  Stress: Not on file  Social Connections: Not on file  Intimate Partner Violence: Not on file    ALLERGIES: Prednisone  MEDICATIONS:  Current Outpatient Medications  Medication Sig Dispense Refill   aspirin EC 81 MG tablet Take 81 mg by mouth at bedtime.      furosemide (LASIX) 20 MG tablet TAKE 1 TABLET BY MOUTH DAILY AS NEEDED FOR SWELLING OF LEG 90 tablet 0   influenza vaccine adjuvanted (FLUAD QUADRIVALENT) 0.5 ML injection Inject into the  muscle. 0.5 mL 0   loratadine (CLARITIN) 10 MG tablet Take 10 mg by mouth daily.     meloxicam (MOBIC) 7.5 MG tablet TAKE 1 TABLET (7.5 MG TOTAL) BY MOUTH DAILY. USE AS NEEDED FOR ARTHRITIS 30 tablet 6   mesalamine (LIALDA) 1.2 g EC tablet Take 1.2 g by mouth in the morning and at bedtime.      methotrexate (RHEUMATREX) 2.5 MG tablet Take 6 tablets (15 mg total) by mouth once a week. Caution:Chemotherapy. Protect from light. 20 tablet 0   metoprolol tartrate (LOPRESSOR) 25 MG tablet TAKE 1/2 TABLET BY MOUTH DAILY 45 tablet 3   montelukast (SINGULAIR) 10 MG tablet TAKE ONE TABLET BY MOUTH EVERY NIGHT AT BEDTIME 90 tablet 3   Multiple Vitamins-Minerals (CENTRUM SILVER PO) Take 1 tablet by mouth daily.  nitroGLYCERIN (NITROSTAT) 0.4 MG SL tablet Place 1 tablet (0.4 mg total) under the tongue every 5 (five) minutes as needed for chest pain. 25 tablet 3   rosuvastatin (CRESTOR) 20 MG tablet Take 1 tablet (20 mg total) by mouth daily. 90 tablet 3   tamsulosin (FLOMAX) 0.4 MG CAPS capsule Take 0.4 mg by mouth daily. (Patient not taking: Reported on 10/15/2022)     triamcinolone ointment (KENALOG) 0.5 % Apply 1 application topically 2 (two) times daily. Use as needed for skin rash 60 g 1   No current facility-administered medications for this encounter.    REVIEW OF SYSTEMS:  On review of systems, the patient reports that he is doing well overall. He developed progressive right breast gynecomastia with breast/nipple sensitivity while on ADT for his high risk prostate cancer and this has persisted even after completing ADT 11/2020 and recovery of his testosterone, up to 181 in 08/2022. He denies any chest pain, shortness of breath, cough, fevers, chills, night sweats, unintended weight changes. He denies any bowel disturbances, and denies abdominal pain, nausea or vomiting. He denies any new musculoskeletal or joint aches or pains.  A complete review of systems is obtained and is otherwise negative.     PHYSICAL EXAM:  Wt Readings from Last 3 Encounters:  10/11/22 182 lb 9.6 oz (82.8 kg)  09/08/22 185 lb 3.2 oz (84 kg)  05/20/22 186 lb (84.4 kg)   Temp Readings from Last 3 Encounters:  10/11/22 97.8 F (36.6 C) (Temporal)  09/08/22 97.6 F (36.4 C) (Oral)  05/20/22 97.6 F (36.4 C) (Oral)   BP Readings from Last 3 Encounters:  10/11/22 120/80  09/08/22 138/64  05/20/22 132/70   Pulse Readings from Last 3 Encounters:  10/11/22 60  09/08/22 (!) 54  05/20/22 60   Pain Assessment Pain Score: 0-No pain/10  Physical exam not performed in light of telephone encounter.   KPS = 100  100 - Normal; no complaints; no evidence of disease. 90   - Able to carry on normal activity; minor signs or symptoms of disease. 80   - Normal activity with effort; some signs or symptoms of disease. 72   - Cares for self; unable to carry on normal activity or to do active work. 60   - Requires occasional assistance, but is able to care for most of his personal needs. 50   - Requires considerable assistance and frequent medical care. 68   - Disabled; requires special care and assistance. 58   - Severely disabled; hospital admission is indicated although death not imminent. 51   - Very sick; hospital admission necessary; active supportive treatment necessary. 10   - Moribund; fatal processes progressing rapidly. 0     - Dead  Karnofsky DA, Abelmann [place], Craver LS and Burchenal [facility] [phone]) The use of the nitrogen mustards in the palliative treatment of carcinoma: with particular reference to bronchogenic carcinoma Cancer 1 634-56  LABORATORY DATA:  Lab Results  Component Value Date   WBC 4.4 01/25/2022   HGB 11.6 (L) 01/25/2022   HCT 35.1 (L) 01/25/2022   MCV 91.6 01/25/2022   PLT 230.0 01/25/2022   Lab Results  Component Value Date   NA 140 05/05/2022   K 4.2 05/05/2022   CL 107 05/05/2022   CO2 25 05/05/2022   Lab Results  Component Value Date   ALT 15 01/25/2022   AST 17  01/25/2022   ALKPHOS 88 01/25/2022   BILITOT 0.6 01/25/2022  RADIOGRAPHY: US Abdomen Limited RUQ (LIVER/GB)  Result Date: 10/14/2022 CLINICAL DATA:  Cholelithiasis EXAM: ULTRASOUND ABDOMEN LIMITED RIGHT UPPER QUADRANT COMPARISON:  Coronary artery CT 10/11/2022 FINDINGS: Gallbladder: Multiple stones in the gallbladder lumen measuring up to 14 mm. No gallbladder wall thickening or pericholecystic fluid. Negative sonographic Theissen's sign. Common bile duct: Diameter: 3 mm Liver: No focal lesion identified. Within normal limits in parenchymal echogenicity. Portal vein is patent on color Doppler imaging with normal direction of blood flow towards the liver. Other: None. IMPRESSION: Cholelithiasis without secondary signs of acute cholecystitis. Electronically Signed   By: Lovey Newcomer M.D.   On: 10/14/2022 16:10   CT CARDIAC SCORING (SELF PAY ONLY)  Result Date: 10/11/2022 EXAM: OVER-READ INTERPRETATION  CT CHEST The following report is an over-read performed by radiologist Dr. Ruthann Cancer of [facility] [place] on 10/11/2022. This over-read does not include interpretation of cardiac or coronary anatomy or pathology. The coronary calcium score interpretation by the cardiologist is attached. COMPARISON:  11/18/2015 FINDINGS: Vascular: No significant vascular findings. Normal heart size. No pericardial effusion. Mediastinum/Nodes: No enlarged mediastinal or axillary lymph nodes. Thyroid gland, trachea, and esophagus demonstrate no significant findings. Lungs/Pleura: Lungs are clear. No pleural effusion or pneumothorax. Upper Abdomen: No acute abnormality. Musculoskeletal: No chest wall mass or suspicious bone lesions identified. IMPRESSION: No acute or significant intrathoracic abnormality. Ruthann Cancer, MD [facility] Electronically Signed   By: Ruthann Cancer M.D.   On: 10/11/2022 16:42   DG Ribs Unilateral W/Chest Left  Result Date:  10/11/2022 CLINICAL DATA:  Left chest pain EXAM: LEFT RIBS AND CHEST - 3+ VIEW COMPARISON:  Chest radiograph done on 01/25/2022 FINDINGS: Cardiac size is within normal limits. There are no focal infiltrates. There is no pleural effusion or pneumothorax. No fracture is seen in bony structures. There are no focal lytic lesions. There are calcific densities in right upper abdomen suggesting possible gallbladder stones. IMPRESSION: No active disease is seen in chest. No fracture is seen in left ribs. Possible gallbladder stones. Electronically Signed   By: Elmer Picker M.D.   On: 10/11/2022 14:29      IMPRESSION/PLAN: This visit was conducted via Telephone to spare the patient unnecessary potential exposure in the healthcare setting during the current COVID-19 pandemic. 1. 66 y.o. gentleman with gynecomastia from ADT treatment for Gleason 4+5 prostate cancer  Today, we talked to the patient and his wife about the findings and workup thus far. We discussed the natural history of drug-induced gynecomastia and general treatment, highlighting the role of radiotherapy in the management. We discussed the available radiation techniques, and focused on the details and logistics of delivery. The recommendation is for a 3 fx course of low dose radiation to the breast(s). We reviewed the anticipated acute and late sequelae associated with radiation in this setting.  He understands that this treatment will not likely cause any reduction in the size of the breast but is intended to prevent further tissue growth and provide symptomatic relief.  The patient was encouraged to ask questions that were answered to his stated satisfaction.  At the end of the conversation the patient is interested in moving forward with the recommended 3 fx course of low dose radiation to the breast(s).  He appears to have a good understanding of his disease and our treatment recommendations and is comfortable and in agreement with the stated  plan.  He is tentatively scheduled for CT simulation on Monday, November 6 at 1 PM in  anticipation of beginning his treatments in the near future.  He will sign formal written consent to proceed at that time but knows that he is welcome to call at anytime in the interim with any questions or concerns related to this treatment.  We will share our discussion with Dr. Tresa Moore and proceed with planning accordingly.   Given current concerns for patient exposure during the COVID-19 pandemic, this encounter was conducted via telephone. The patient was notified in advance and was offered a MyChart meeting to allow for face to face communication but unfortunately reported that he did not have the appropriate resources/technology to support such a visit and instead preferred to proceed with telephone consult. The patient has given verbal consent for this type of encounter. The attendants for this meeting include Tyler Pita MD, Noelie Renfrow PA-C, and patient, Tyler Estrada and his wife, Tyler Estrada. During the encounter, Tyler Pita MD and Freeman Caldron PA-C  were located at [facility] Radiation Oncology Department.  Patient, Tyler Estrada and his wife were located at home.  We personally spent 45 minutes in this encounter including chart review, reviewing radiological studies, meeting face-to-face with the patient, entering orders and completing documentation.    Nicholos Johns, PA-C    Tyler Pita, MD  [facility] Direct Dial: [phone]  Fax: [phone] [email or web address]  Skype  [email or web address]   This document serves as a record of services personally performed by Tyler Pita, MD and Freeman Caldron, PA-C. It was created on their behalf by Wilburn Mylar, a trained medical scribe. The creation of this record is based on the scribe's personal observations and the provider's statements to them. This document has been checked and approved by the  attending provider.

## 2022-10-19 ENCOUNTER — Encounter: Payer: Self-pay | Admitting: Family Medicine

## 2022-10-19 NOTE — Telephone Encounter (Signed)
Although its only been a couple of days, are you able to see where this referral stands?

## 2022-10-25 ENCOUNTER — Ambulatory Visit
Admission: RE | Admit: 2022-10-25 | Discharge: 2022-10-25 | Disposition: A | Discharge status: Home / Self Care | Payer: Medicare HMO | Source: Ambulatory Visit | Attending: Radiation Oncology | Admitting: Radiation Oncology

## 2022-10-25 ENCOUNTER — Other Ambulatory Visit: Payer: Self-pay

## 2022-10-25 DIAGNOSIS — T50905A Adverse effect of unspecified drugs, medicaments and biological substances, initial encounter: Secondary | ICD-10-CM

## 2022-10-25 DIAGNOSIS — N62 Hypertrophy of breast: Secondary | ICD-10-CM | POA: Diagnosis not present

## 2022-10-25 DIAGNOSIS — C61 Malignant neoplasm of prostate: Secondary | ICD-10-CM | POA: Diagnosis not present

## 2022-10-25 DIAGNOSIS — Z510 Encounter for antineoplastic radiation therapy: Secondary | ICD-10-CM | POA: Insufficient documentation

## 2022-10-25 NOTE — Progress Notes (Signed)
  Radiation Oncology         (336) [phone] ________________________________  Name: Tyler Estrada MRN: [id number]  Date: 10/25/2022  DOB: 1956/09/01  SIMULATION AND TREATMENT PLANNING NOTE    ICD-10-CM   1. Prostate cancer ([place])  C61     2. Drug-induced gynecomastia  N62    T50.905A       DIAGNOSIS:  66 y.o. gentleman with pain from drug-induced gynecomastia from LT-ADT for Gleason 4+5 prostate cancer  NARRATIVE:  The patient was brought to the [place].  Identity was confirmed.  All relevant records and images related to the planned course of therapy were reviewed.  The patient freely provided informed written consent to proceed with treatment after reviewing the details related to the planned course of therapy. The consent form was witnessed and verified by the simulation staff.  Then, the patient was set-up in a stable reproducible  supine position for radiation therapy.  CT images were obtained.  Surface markings were placed.  The CT images were loaded into the planning software.  Then the target and avoidance structures were contoured.  Treatment planning then occurred.  The radiation prescription was entered and confirmed.  Then, I designed and supervised the construction of a total of 2 medically necessary complex treatment devices consisting of cerrobend cut-outs to shape the radiation around the breast tissue on the left and right side.  I have requested : Isodose Plan.   PLAN:  The patient will receive 12 Gy in 3 fractions.  ________________________________  Sheral Apley Tammi Klippel, M.D.

## 2022-10-28 DIAGNOSIS — C61 Malignant neoplasm of prostate: Secondary | ICD-10-CM | POA: Diagnosis not present

## 2022-10-28 DIAGNOSIS — N62 Hypertrophy of breast: Secondary | ICD-10-CM | POA: Diagnosis not present

## 2022-10-28 DIAGNOSIS — Z510 Encounter for antineoplastic radiation therapy: Secondary | ICD-10-CM | POA: Diagnosis not present

## 2022-10-28 DIAGNOSIS — T50905A Adverse effect of unspecified drugs, medicaments and biological substances, initial encounter: Secondary | ICD-10-CM | POA: Diagnosis not present

## 2022-10-28 DIAGNOSIS — R0789 Other chest pain: Secondary | ICD-10-CM | POA: Diagnosis not present

## 2022-11-01 ENCOUNTER — Other Ambulatory Visit: Payer: Self-pay

## 2022-11-01 ENCOUNTER — Ambulatory Visit
Admission: RE | Admit: 2022-11-01 | Discharge: 2022-11-01 | Disposition: A | Discharge status: Home / Self Care | Payer: Medicare HMO | Source: Ambulatory Visit | Attending: Radiation Oncology | Admitting: Radiation Oncology

## 2022-11-01 DIAGNOSIS — C61 Malignant neoplasm of prostate: Secondary | ICD-10-CM | POA: Diagnosis not present

## 2022-11-01 DIAGNOSIS — N62 Hypertrophy of breast: Secondary | ICD-10-CM | POA: Diagnosis not present

## 2022-11-01 DIAGNOSIS — T50905A Adverse effect of unspecified drugs, medicaments and biological substances, initial encounter: Secondary | ICD-10-CM | POA: Diagnosis not present

## 2022-11-01 DIAGNOSIS — Z510 Encounter for antineoplastic radiation therapy: Secondary | ICD-10-CM | POA: Diagnosis not present

## 2022-11-01 LAB — RAD ONC ARIA SESSION SUMMARY
Course Elapsed Days: 0
Plan Fractions Treated to Date: 1
Plan Fractions Treated to Date: 1
Plan Prescribed Dose Per Fraction: 4 Gy
Plan Prescribed Dose Per Fraction: 4 Gy
Plan Total Fractions Prescribed: 3
Plan Total Fractions Prescribed: 3
Plan Total Prescribed Dose: 12 Gy
Plan Total Prescribed Dose: 12 Gy
Reference Point Dosage Given to Date: 4 Gy
Reference Point Dosage Given to Date: 4 Gy
Reference Point Session Dosage Given: 4 Gy
Reference Point Session Dosage Given: 4 Gy
Session Number: 1

## 2022-11-02 ENCOUNTER — Ambulatory Visit: Payer: Medicare HMO

## 2022-11-03 ENCOUNTER — Other Ambulatory Visit: Payer: Self-pay

## 2022-11-03 ENCOUNTER — Ambulatory Visit: Payer: Medicare HMO

## 2022-11-03 ENCOUNTER — Ambulatory Visit
Admission: RE | Admit: 2022-11-03 | Discharge: 2022-11-03 | Disposition: A | Discharge status: Home / Self Care | Payer: Medicare HMO | Source: Ambulatory Visit | Attending: Radiation Oncology | Admitting: Radiation Oncology

## 2022-11-03 DIAGNOSIS — C61 Malignant neoplasm of prostate: Secondary | ICD-10-CM | POA: Diagnosis not present

## 2022-11-03 DIAGNOSIS — Z510 Encounter for antineoplastic radiation therapy: Secondary | ICD-10-CM | POA: Diagnosis not present

## 2022-11-03 LAB — RAD ONC ARIA SESSION SUMMARY
Course Elapsed Days: 2
Plan Fractions Treated to Date: 2
Plan Fractions Treated to Date: 2
Plan Prescribed Dose Per Fraction: 4 Gy
Plan Prescribed Dose Per Fraction: 4 Gy
Plan Total Fractions Prescribed: 3
Plan Total Fractions Prescribed: 3
Plan Total Prescribed Dose: 12 Gy
Plan Total Prescribed Dose: 12 Gy
Reference Point Dosage Given to Date: 8 Gy
Reference Point Dosage Given to Date: 8 Gy
Reference Point Session Dosage Given: 4 Gy
Reference Point Session Dosage Given: 4 Gy
Session Number: 2

## 2022-11-05 ENCOUNTER — Ambulatory Visit
Admission: RE | Admit: 2022-11-05 | Discharge: 2022-11-05 | Disposition: A | Discharge status: Home / Self Care | Payer: Medicare HMO | Source: Ambulatory Visit | Attending: Radiation Oncology | Admitting: Radiation Oncology

## 2022-11-05 ENCOUNTER — Ambulatory Visit: Payer: Medicare HMO | Admitting: Urology

## 2022-11-05 ENCOUNTER — Other Ambulatory Visit: Payer: Self-pay

## 2022-11-05 ENCOUNTER — Encounter: Payer: Self-pay | Admitting: Urology

## 2022-11-05 ENCOUNTER — Ambulatory Visit: Payer: Medicare HMO

## 2022-11-05 DIAGNOSIS — T50905A Adverse effect of unspecified drugs, medicaments and biological substances, initial encounter: Secondary | ICD-10-CM | POA: Diagnosis not present

## 2022-11-05 DIAGNOSIS — N62 Hypertrophy of breast: Secondary | ICD-10-CM | POA: Diagnosis not present

## 2022-11-05 DIAGNOSIS — Z510 Encounter for antineoplastic radiation therapy: Secondary | ICD-10-CM | POA: Diagnosis not present

## 2022-11-05 DIAGNOSIS — C61 Malignant neoplasm of prostate: Secondary | ICD-10-CM | POA: Diagnosis not present

## 2022-11-05 LAB — RAD ONC ARIA SESSION SUMMARY
Course Elapsed Days: 4
Plan Fractions Treated to Date: 3
Plan Fractions Treated to Date: 3
Plan Prescribed Dose Per Fraction: 4 Gy
Plan Prescribed Dose Per Fraction: 4 Gy
Plan Total Fractions Prescribed: 3
Plan Total Fractions Prescribed: 3
Plan Total Prescribed Dose: 12 Gy
Plan Total Prescribed Dose: 12 Gy
Reference Point Dosage Given to Date: 12 Gy
Reference Point Dosage Given to Date: 12 Gy
Reference Point Session Dosage Given: 4 Gy
Reference Point Session Dosage Given: 4 Gy
Session Number: 3

## 2022-11-08 DIAGNOSIS — R079 Chest pain, unspecified: Secondary | ICD-10-CM

## 2022-11-08 DIAGNOSIS — I499 Cardiac arrhythmia, unspecified: Secondary | ICD-10-CM | POA: Diagnosis not present

## 2022-11-22 DIAGNOSIS — R079 Chest pain, unspecified: Secondary | ICD-10-CM | POA: Diagnosis not present

## 2022-11-25 ENCOUNTER — Encounter: Payer: Self-pay | Admitting: Family Medicine

## 2022-11-26 ENCOUNTER — Encounter: Payer: Self-pay | Admitting: Family Medicine

## 2022-11-29 ENCOUNTER — Telehealth: Payer: Self-pay

## 2022-11-29 NOTE — Telephone Encounter (Signed)
Left a message for the pt to call back re: making him an appt for tomorrow morning at 8:20 am with Dr Harrington Challenger.

## 2022-11-29 NOTE — Telephone Encounter (Signed)
-----   Message from Fay Records, MD sent at 11/26/2022  9:40 AM EST ----- Regarding: RE: zio HI! Thx for note    I agree with low dose metoprolol, nothing else for now     I'll make sure he has follow up with me to see how he is doing  Tyler Estrada  ----- Message ----- From: Darreld Mclean, MD Sent: 11/25/2022   6:45 AM EST To: Fay Records, MD Subject: [name]- I got a Zio for [employer] as he had some concern of palp and his iwatch was reading arrhythmia ------------------------------------------------------------------------------------- Patch Wear Time:  6 days and 16 hours (2023-11-20T10:46:26-0500 to 2023-11-27T03:08:21-0500)   Predominant rhythm  Sinus rhythm   Rates 50 to 128 bpm  Average HR 75 bpm    Rare PACs, PVCs.     Few short bursts of SVT, longest 19 beats, fastest for 8 beats (197 bpm)   Triggered events correlated with SR.     Patient had a min HR of 50 bpm, max HR of 197 bpm, and avg HR of 75 bpm. Predominant underlying rhythm was Sinus Rhythm. 19 Supraventricular Tachycardia runs occurred, the run with the fastest interval lasting 8 beats with a max rate of 197 bpm, the  longest lasting 14 beats with an avg rate of 133 bpm. Isolated SVEs were rare (<1.0%), SVE Couplets were rare (<1.0%), and SVE Triplets were rare (<1.0%). Isolated VEs were rare (<1.0%), VE Couplets were rare (<1.0%), and no VE Triplets were present.  Ventricular Bigeminy and Trigeminy were present. -------------------------------------------------  From your last note I think you planned to see him this winter for recheck.  He is on a low dose of metoprolol.  Anything else you would like me to do in the meantime?  Sorry to hit you up for free advice!  Jess

## 2022-11-30 ENCOUNTER — Ambulatory Visit: Payer: Medicare HMO | Admitting: Internal Medicine

## 2022-11-30 ENCOUNTER — Ambulatory Visit: Payer: Medicare HMO | Admitting: Cardiovascular Disease

## 2022-11-30 NOTE — Progress Notes (Incomplete)
Cardiology Office Note   Date:  11/30/2022   ID:  Tyler Estrada, DOB 14-May-1956, MRN [id number]  PCP:  Darreld Mclean, MD  Cardiologist:   Jenkins Rouge, MD   F/U of CAD and DOE       History of Present Illness: Tyler Estrada is a 66 y.o. male with a history ofcoronary artery disease status post prior stenting to the RCA in 2008, prior pericarditis treated with colchicine, hypertension, hyperlipidemia, ulcerative colitis.  Cardiac catheterization in November 2016 demonstrated patent stents in the RCA and moderate nonobstructive disease in the LAD.  Ejection fraction by echocardiogram in 2016 was normal.    Added on to my schedule as Dr Harrington Challenger called out due to family emergency   He was seen by Gerrianne Scale Feb 2023    Complained of DOE at that time      03/01/22 Echo was normal   Stress myoview  Walked almost 6 min   Normal perfusion no ischemia  LVEF 51%    He says he still gets SOB with exertion   Questions if it is deconditioning    Denies CP     Has had some palpitations and concern for arrhythmia on apple watch Monitor 11/24/22 average HR 75 bpm self limited SVT only 14 beats longest PAC/PVC;s < 1% total beats  He is on lopressor   ***   No outpatient medications have been marked as taking for the 11/30/22 encounter (Appointment) with Josue Hector, MD.     Allergies:   Prednisone   Past Medical History:  Diagnosis Date   Arthritis    knees, hips, hands   Complication of anesthesia    Woke up during hip replacement, had surgery since with no problems   Coronary artery disease    a. s/p DES to RCA in 2008;  b. 10/2015 Cath: LM nl, LAD 50ost/p, OM1/2 min irregs, RCA 54mISR, 376m ISR, EF 65%.   Echocardiogram    Echo 11/16: EF 60-65, normal wall motion, grade 1 diastolic dysfunction   Grade I diastolic dysfunction 1175/09/2584 ECHO   Hyperlipidemia    Iron deficiency anemia due to chronic blood loss 09/10/2015   Melanoma (HCC)    Nose   Pericarditis    a.  10/2015->colchicine.   Prostate cancer ([facility]   Ulcerative colitis with complication ([facility]9/2/77/8242  Past Surgical History:  Procedure Laterality Date   ANTERIOR CERVICAL DECOMP/DISCECTOMY FUSION N/A 03/26/2020   Procedure: Cervical Four-Five, Cervical Five-Six, Cervical Six-Seven Anterior Cervical Decompression/Discectomy/Fusion;  Surgeon: CaAshok PallMD;  [facility]: [facility]surgery;  Laterality: N/A;  3C   CARDIAC CATHETERIZATION N/A 11/18/2015   Procedure: Left Heart Cath and Coronary Angiography;  Surgeon: MiSherren MochaMD;  [facility];  Service: Cardiovascular;  Laterality: N/A;   CARPAL TUNNEL RELEASE Right 03/26/2020   Procedure: CARPAL TUNNEL RELEASE;  Surgeon: CaAshok PallMD;  [facility] Service: Neurosurgery;  Laterality: Right;   CERVICAL SPINE SURGERY     COLONOSCOPY  2019   CORONARY ANGIOPLASTY WITH STENT PLACEMENT  2008   a. Cordis stent to RCA in 2008 2.7593m 34m42mHIP ARTHROPLASTY Right    MOHS SURGERY     PARTIAL KNEE ARTHROPLASTY Left 11/30/2018   Procedure: LEFT UNICOMPARTMENTAL KNEE Medially;  Surgeon: OlinParalee Cancel;  [facility]: [facility];  Service: Orthopedics;  Laterality: Left;  70 mins with block   RADIOACTIVE SEED IMPLANT N/A 08/10/2019  Procedure: RADIOACTIVE SEED IMPLANT/BRACHYTHERAPY IMPLANT;  Surgeon: Alexis Frock, MD;  Location: [facility];  Service: Urology;  Laterality: N/A;   SPACE OAR INSTILLATION N/A 08/10/2019   Procedure: SPACE OAR INSTILLATION;  Surgeon: Alexis Frock, MD;  Location: [facility];  Service: Urology;  Laterality: N/A;   TONSILLECTOMY     childhood     Social History:  The patient  reports that he has never smoked. He has never used smokeless tobacco. He reports current alcohol use. He reports that he does not use drugs.   Family History:  The patient's family history includes Arthritis in his mother and sister; CAD in his father; Cancer in his mother;  Heart attack in his father; Heart disease in his father and sister; Hypertension in his brother and mother; Lymphoma in his mother.    ROS:  Please see the history of present illness. All other systems are reviewed and  Negative to the above problem except as noted.    PHYSICAL EXAM: VS:  There were no vitals taken for this visit.   Affect appropriate Healthy:  appears stated age 15: normal Neck supple with no adenopathy JVP normal no bruits no thyromegaly Lungs clear with no wheezing and good diaphragmatic motion Heart:  S1/S2 no murmur, no rub, gallop or click PMI normal Abdomen: benighn, BS positve, no tenderness, no AAA no bruit.  No HSM or HJR Distal pulses intact with no bruits No edema Neuro non-focal Skin warm and dry No muscular weakness    EKG:  EKG is not ordered today     Lipid Panel    Component Value Date/Time   CHOL 125 03/09/2022 1239   TRIG 81 03/09/2022 1239   HDL 53 03/09/2022 1239   CHOLHDL 2.4 03/09/2022 1239   CHOLHDL 3 09/23/2021 1138   VLDL 30.6 09/23/2021 1138   LDLCALC 56 03/09/2022 1239   LDLCALC 106 (H) 10/13/2020 0908      Wt Readings from Last 3 Encounters:  10/11/22 182 lb 9.6 oz (82.8 kg)  09/08/22 185 lb 3.2 oz (84 kg)  05/20/22 186 lb (84.4 kg)      ASSESSMENT AND PLAN:  1  DOE   No ischemia on myovue 03/01/22 EF 51% TTE same day EF 60-65% normal RV And no significant valve dx Likely functional Can consider cardiopulmonary stress test in future   2 CAD  negative perfusion study3/13/23 with prior stent to RCA patent by cath 2016  3   HTN  BP is well controlled    4   HL  on statin LDL at goal 56   5.  Palpitations: benign self limited SVT and < 1% PAC/PVC on monitor Continue lopressor  F/U Dr Harrington Challenger 6 months   Current medicines are reviewed at length with the patient today.  The patient does not have concerns regarding medicines.  Signed, Jenkins Rouge, MD  11/30/2022 8:50 AM    [facility] [place], [place],   [address] Phone: [phone]; Fax: [phone]

## 2022-12-01 DIAGNOSIS — L280 Lichen simplex chronicus: Secondary | ICD-10-CM | POA: Diagnosis not present

## 2022-12-01 DIAGNOSIS — L570 Actinic keratosis: Secondary | ICD-10-CM | POA: Diagnosis not present

## 2022-12-03 ENCOUNTER — Telehealth: Payer: Self-pay | Admitting: Internal Medicine

## 2022-12-03 NOTE — Telephone Encounter (Signed)
Patient is calling to reschedule with Dr. Harrington Challenger. Dr. Harrington Challenger wants to see patient soon. Earliest slot I see is on 01/10/23.

## 2022-12-03 NOTE — Telephone Encounter (Signed)
Pt aware the nurse would contact him to add him onto Dr. Alan Ripper schedule. He is agreeable to plan.

## 2022-12-03 NOTE — Telephone Encounter (Signed)
Please add patient on to a full day of clinic for me

## 2022-12-06 ENCOUNTER — Encounter: Payer: Self-pay | Admitting: Family Medicine

## 2022-12-07 NOTE — Telephone Encounter (Signed)
I spoke with the pt and he is getting on a cruise ship for 10 days tomorrow... I will send Dr Harrington Challenger a message to be sure his appt can wait until we are back in January.

## 2022-12-21 NOTE — Progress Notes (Signed)
Cardiology Office Note   Date:  12/22/2022   ID:  Tyler Estrada, DOB 04-29-1956, MRN [id number]  PCP:  Pearline Cables, MD  Cardiologist:   Dietrich Pates, MD   F/U of CAD and DOE      History of Present Illness: Tyler Estrada is a 67 y.o. male with a history of coronary artery disease, s/p stent to the RCA in 2008, prior pericarditis treated with colchicine, hypertension, hyperlipidemia, ulcerative colitis.  Cardiac catheterization in November 2016 demonstrated patent stents in the RCA and moderate nonobstructive disease in the LAD.  Ejection fraction by echocardiogram in 2016 was normal.      He was seen by Leda Gauze Feb 2023    Complained of DOE at that time      Echo was normal     Stress myoview    Walked almost 6 min   Normal perfusion   NO ischemia  LVEF 51%     I saw the pt in March 2023    The pt is followed by J Copland   Recently complained of intermittent CP   Longer, more frequent   Last 20 to 30 sec   Not associated with activity     He wore a monitor that showe few short bursts of SVT, longest 19 beats     Triggered event correlated with SR  The pt has noted no significant change in how he feels   OVeall feels OK     Current Meds  Medication Sig   amLODipine (NORVASC) 2.5 MG tablet Take 0.5 tablets (1.25 mg total) by mouth daily.   aspirin EC 81 MG tablet Take 81 mg by mouth at bedtime.    furosemide (LASIX) 20 MG tablet TAKE 1 TABLET BY MOUTH DAILY AS NEEDED FOR SWELLING OF LEG   loratadine (CLARITIN) 10 MG tablet Take 10 mg by mouth daily.   meloxicam (MOBIC) 7.5 MG tablet TAKE 1 TABLET (7.5 MG TOTAL) BY MOUTH DAILY. USE AS NEEDED FOR ARTHRITIS   mesalamine (LIALDA) 1.2 g EC tablet Take 1.2 g by mouth in the morning and at bedtime.    methotrexate (RHEUMATREX) 2.5 MG tablet Take 6 tablets (15 mg total) by mouth once a week. Caution:Chemotherapy. Protect from light.   montelukast (SINGULAIR) 10 MG tablet TAKE ONE TABLET BY MOUTH EVERY NIGHT AT BEDTIME   Multiple  Vitamins-Minerals (CENTRUM SILVER PO) Take 1 tablet by mouth daily.    nitroGLYCERIN (NITROSTAT) 0.4 MG SL tablet Place 1 tablet (0.4 mg total) under the tongue every 5 (five) minutes as needed for chest pain.   rosuvastatin (CRESTOR) 20 MG tablet Take 1 tablet (20 mg total) by mouth daily.   tamsulosin (FLOMAX) 0.4 MG CAPS capsule Take 0.4 mg by mouth daily.   triamcinolone ointment (KENALOG) 0.5 % Apply 1 application topically 2 (two) times daily. Use as needed for skin rash   [DISCONTINUED] metoprolol tartrate (LOPRESSOR) 25 MG tablet TAKE 1/2 TABLET BY MOUTH DAILY     Allergies:   Prednisone   Past Medical History:  Diagnosis Date   Arthritis    knees, hips, hands   Complication of anesthesia    Woke up during hip replacement, had surgery since with no problems   Coronary artery disease    a. s/p DES to RCA in 2008;  b. 10/2015 Cath: LM nl, LAD 50ost/p, OM1/2 min irregs, RCA 59m ISR, 75m/d ISR, EF 65%.   Echocardiogram    Echo 11/16: EF 60-65, normal wall motion, grade  1 diastolic dysfunction   Grade I diastolic dysfunction 11/19/2015   ECHO   Hyperlipidemia    Iron deficiency anemia due to chronic blood loss 09/10/2015   Melanoma (HCC)    Nose   Pericarditis    a. 10/2015->colchicine.   Prostate cancer (HCC)    Ulcerative colitis with complication (HCC) 09/10/2015    Past Surgical History:  Procedure Laterality Date   ANTERIOR CERVICAL DECOMP/DISCECTOMY FUSION N/A 03/26/2020   Procedure: Cervical Four-Five, Cervical Five-Six, Cervical Six-Seven Anterior Cervical Decompression/Discectomy/Fusion;  Surgeon: Coletta Memos, MD;  Location: [facility];  Service: Neurosurgery;  Laterality: N/A;  3C   CARDIAC CATHETERIZATION N/A 11/18/2015   Procedure: Left Heart Cath and Coronary Angiography;  Surgeon: Tonny Bollman, MD;  Location: [facility] INVASIVE CV LAB;  Service: Cardiovascular;  Laterality: N/A;   CARPAL TUNNEL RELEASE Right 03/26/2020   Procedure: CARPAL TUNNEL RELEASE;  Surgeon: Coletta Memos, MD;  [facility];  Service: Neurosurgery;  Laterality: Right;   CERVICAL SPINE SURGERY     COLONOSCOPY  2019   CORONARY ANGIOPLASTY WITH STENT PLACEMENT  2008   a. Cordis stent to RCA in 2008 2.84mm x 18mm   HIP ARTHROPLASTY Right    MOHS SURGERY     PARTIAL KNEE ARTHROPLASTY Left 11/30/2018   Procedure: LEFT UNICOMPARTMENTAL KNEE Medially;  Surgeon: Durene Romans, MD;  [facility];  Service: Orthopedics;  Laterality: Left;  70 mins with block   RADIOACTIVE SEED IMPLANT N/A 08/10/2019   Procedure: RADIOACTIVE SEED IMPLANT/BRACHYTHERAPY IMPLANT;  Surgeon: Sebastian Ache, MD;  Location: [facility];  Service: Urology;  Laterality: N/A;   SPACE OAR INSTILLATION N/A 08/10/2019   Procedure: SPACE OAR INSTILLATION;  Surgeon: Sebastian Ache, MD;  Location: [facility];  Service: Urology;  Laterality: N/A;   TONSILLECTOMY     childhood     Social History:  The patient  reports that he has never smoked. He has never used smokeless tobacco. He reports current alcohol use. He reports that he does not use drugs.   Family History:  The patient's family history includes Arthritis in his mother and sister; CAD in his father; Cancer in his mother; Heart attack in his father; Heart disease in his father and sister; Hypertension in his brother and mother; Lymphoma in his mother.    ROS:  Please see the history of present illness. All other systems are reviewed and  Negative to the above problem except as noted.    PHYSICAL EXAM: VS:  BP (!) 114/58   Pulse 63   Ht 5\' 3"  (1.6 m)   Wt 190 lb 9.6 oz (86.5 kg)   SpO2 97%   BMI 33.76 kg/m   GEN: Obese 67 yo  in no acute distress  HEENT: normal  Neck: JVP is not elevated   Cardiac: RRR; no murmurs,   NO LE edema  Respiratory:  clear to auscultation bilaterally, GI: soft, nontender, nondistended, + BS  No hepatomegaly  MS: no deformity Moving all extremities   Skin: warm and dry, no rash Neuro:  Strength  and sensation are intact Psych: euthymic mood, full affect   EKG:  EKG is not ordered today    [place]   Dec 2023  Predominant rhythm  Sinus rhythm   Rates 50 to 128 bpm  Average HR 75 bpm    Rare PACs, PVCs.     Few short bursts of SVT, longest 19 beats, fastest for 8 beats (197 bpm)   Triggered events correlated  with SR.   Lipid Panel    Component Value Date/Time   CHOL 125 03/09/2022 1239   TRIG 81 03/09/2022 1239   HDL 53 03/09/2022 1239   CHOLHDL 2.4 03/09/2022 1239   CHOLHDL 3 09/23/2021 1138   VLDL 30.6 09/23/2021 1138   LDLCALC 56 03/09/2022 1239   LDLCALC 106 (H) 10/13/2020 0908      Wt Readings from Last 3 Encounters:  12/22/22 190 lb 9.6 oz (86.5 kg)  10/11/22 182 lb 9.6 oz (82.8 kg)  09/08/22 185 lb 3.2 oz (84 kg)      ASSESSMENT AND PLAN:   1  Chest discomfort   Atypical for angina    ? Spasm   I would stop Toprol and try low dose amlodioine 1.25 mg     Follow   2  CAD    No symptoms of typical angina   Follow   3   HTN  BP is well controlled    4   HL  LDL In March 2023 was 56      F/U next fall      Current medicines are reviewed at length with the patient today.  The patient does not have concerns regarding medicines.  Signed, Dietrich Pates, MD  12/22/2022 10:27 PM    [facility] [address] [place], [place], [place]  [address] Phone: [phone]; Fax: [phone]

## 2022-12-22 ENCOUNTER — Encounter: Payer: Self-pay | Admitting: Internal Medicine

## 2022-12-22 ENCOUNTER — Other Ambulatory Visit: Payer: Self-pay | Admitting: Family Medicine

## 2022-12-22 ENCOUNTER — Ambulatory Visit: Payer: Medicare HMO | Attending: Internal Medicine | Admitting: Internal Medicine

## 2022-12-22 VITALS — BP 114/58 | HR 63 | Ht 63.0 in | Wt 190.6 lb

## 2022-12-22 DIAGNOSIS — I2510 Atherosclerotic heart disease of native coronary artery without angina pectoris: Secondary | ICD-10-CM

## 2022-12-22 DIAGNOSIS — L309 Dermatitis, unspecified: Secondary | ICD-10-CM

## 2022-12-22 MED ORDER — AMLODIPINE BESYLATE 2.5 MG PO TABS: 1.2500 mg | ORAL_TABLET | Freq: Every day | ORAL | 3 refills | Status: DC

## 2022-12-22 NOTE — Patient Instructions (Signed)
Medication Instructions:  Stop metoprolol Start Amlodipine 2.5 mg take 1/2 daily   *If you need a refill on your cardiac medications before your next appointment, please call your pharmacy*   Lab Work: Hgba1c with your PCP   If you have labs (blood work) drawn today and your tests are completely normal, you will receive your results only by: [place] (if you have MyChart) OR A paper copy in the mail If you have any lab test that is abnormal or we need to change your treatment, we will call you to review the results.   Testing/Procedures:    Follow-Up: At [facility], you and your health needs are our priority.  As part of our continuing mission to provide you with exceptional heart care, we have created designated Provider Care Teams.  These Care Teams include your primary Cardiologist (physician) and Advanced Practice Providers (APPs -  Physician Assistants and Nurse Practitioners) who all work together to provide you with the care you need, when you need it.  We recommend signing up for the patient portal called "MyChart".  Sign up information is provided on this After Visit Summary.  MyChart is used to connect with patients for Virtual Visits (Telemedicine).  Patients are able to view lab/test results, encounter notes, upcoming appointments, etc.  Non-urgent messages can be sent to your provider as well.   To learn more about what you can do with MyChart, go to [email or web address].    Your next appointment:   6 month(s)  The format for your next appointment:   In Person  Provider:   Dorris Carnes, MD     Other Instructions   Important Information About Sugar

## 2022-12-24 NOTE — Progress Notes (Signed)
                                                                                                                                                             Patient Name: Tyler Estrada MRN: [id number] DOB: 02/04/1956 Referring Physician: Alexis Frock (Profile Not Attached) Date of Service: 11/05/2022 [facility] [place]                                                        End Of Treatment Note  Diagnoses: C61-Malignant neoplasm of prostate  Cancer Staging:  67 y.o. gentleman with gynecomastia from LT-ADT for Gleason 4+5 prostate cancer   Intent: Curative  Radiation Treatment Dates: 11/01/2022 through 11/05/2022 Site Technique Total Dose (Gy) Dose per Fx (Gy) Completed Fx Beam Energies  Breast, Left: Breast_L specialPort 12/12 4 3/3 9E  Breast, Right: Breast_R specialPort 12/12 4 3/3 9E   Narrative: The patient tolerated radiation therapy relatively well.   Plan: The patient will receive a call in about one month from the radiation oncology department. He will continue follow up with his urologist, Dr. Tresa Moore, as well.  ------------------------------------------------   Tyler Pita, MD [place]: [phone]  Fax: [phone] [email or web address]  Skype  LinkedIn

## 2022-12-27 ENCOUNTER — Other Ambulatory Visit: Payer: Self-pay | Admitting: Family Medicine

## 2022-12-27 ENCOUNTER — Ambulatory Visit (INDEPENDENT_AMBULATORY_CARE_PROVIDER_SITE_OTHER): Payer: Medicare HMO

## 2022-12-27 DIAGNOSIS — Z23 Encounter for immunization: Secondary | ICD-10-CM | POA: Diagnosis not present

## 2022-12-27 NOTE — Progress Notes (Signed)
Pt seen with his wife- will give prevnar

## 2022-12-27 NOTE — Progress Notes (Signed)
Pt received his Prevnar 20 immunization today per Dr Lorelei Pont  Prevnar 20 0.5 ML givenIM  in the L arm and pt tolerated injection well.

## 2022-12-29 ENCOUNTER — Other Ambulatory Visit: Payer: Self-pay | Admitting: *Deleted

## 2022-12-29 DIAGNOSIS — R600 Localized edema: Secondary | ICD-10-CM

## 2022-12-29 MED ORDER — FUROSEMIDE 20 MG PO TABS: ORAL_TABLET | ORAL | 0 refills | Status: AC

## 2023-01-03 NOTE — Progress Notes (Signed)
  Radiation Oncology         [phone] ________________________________  Name: Tyler Estrada MRN: [id number]  Date of Service: 01/04/2023  DOB: Feb 12, 1956  Post Treatment Telephone Note  Diagnosis:   67 y.o. gentleman with gynecomastia from LT-ADT for Gleason 4+5 prostate cancer   Intent: Curative  Radiation Treatment Dates: 11/01/2022 through 11/05/2022 Site Technique Total Dose (Gy) Dose per Fx (Gy) Completed Fx Beam Energies  Breast, Left: Breast_L specialPort 12/12 4 3/3 9E  Breast, Right: Breast_R specialPort 12/12 4 3/3 9E  (as documented in provider EOT note)   The patient was available for call today.   Symptoms of fatigue have improved since completing therapy.  Symptoms of skin changes have  improved since completing therapy.  The patient was encouraged to avoid sun exposure in the area of prior treatment for up to one year following radiation with either sunscreen or by the style of clothing worn in the sun.  The patient will continue follow-ups w/ his Urologist Dr. Tresa Moore for ongoing surveillance, and was encouraged to call if he develops concerns or questions regarding radiation.  This concludes the interview.   Leandra Kern, LPN

## 2023-01-04 ENCOUNTER — Ambulatory Visit
Admission: RE | Admit: 2023-01-04 | Discharge: 2023-01-04 | Disposition: A | Discharge status: Home / Self Care | Payer: Medicare HMO | Source: Ambulatory Visit | Attending: Radiation Oncology | Admitting: Radiation Oncology

## 2023-01-04 DIAGNOSIS — C61 Malignant neoplasm of prostate: Secondary | ICD-10-CM | POA: Insufficient documentation

## 2023-01-04 DIAGNOSIS — Z510 Encounter for antineoplastic radiation therapy: Secondary | ICD-10-CM | POA: Insufficient documentation

## 2023-01-10 ENCOUNTER — Ambulatory Visit: Payer: Medicare HMO | Admitting: Internal Medicine

## 2023-01-10 DIAGNOSIS — L280 Lichen simplex chronicus: Secondary | ICD-10-CM | POA: Diagnosis not present

## 2023-01-10 DIAGNOSIS — L570 Actinic keratosis: Secondary | ICD-10-CM | POA: Diagnosis not present

## 2023-01-10 DIAGNOSIS — K5100 Ulcerative (chronic) pancolitis without complications: Secondary | ICD-10-CM | POA: Diagnosis not present

## 2023-01-18 DIAGNOSIS — M0609 Rheumatoid arthritis without rheumatoid factor, multiple sites: Secondary | ICD-10-CM | POA: Diagnosis not present

## 2023-01-24 ENCOUNTER — Telehealth: Payer: Self-pay | Admitting: Family Medicine

## 2023-01-24 NOTE — Telephone Encounter (Signed)
Copied from [place]. Topic: Medicare AWV >> Jan 24, 2023 11:31 AM Devoria Glassing wrote: Reason for CRM: Left message for patient to schedule Annual Wellness Visit(AWV).  Please schedule with Health Nurse Advisor at [facility]. Please call [phone] ask for [facility].

## 2023-02-02 DIAGNOSIS — M25551 Pain in right hip: Secondary | ICD-10-CM | POA: Diagnosis not present

## 2023-02-02 DIAGNOSIS — Z96641 Presence of right artificial hip joint: Secondary | ICD-10-CM | POA: Diagnosis not present

## 2023-02-10 ENCOUNTER — Other Ambulatory Visit: Payer: Self-pay | Admitting: Physician Assistant

## 2023-02-10 DIAGNOSIS — E785 Hyperlipidemia, unspecified: Secondary | ICD-10-CM

## 2023-02-20 ENCOUNTER — Other Ambulatory Visit: Payer: Self-pay | Admitting: Internal Medicine

## 2023-02-21 DIAGNOSIS — Z85828 Personal history of other malignant neoplasm of skin: Secondary | ICD-10-CM | POA: Diagnosis not present

## 2023-02-21 DIAGNOSIS — D2261 Melanocytic nevi of right upper limb, including shoulder: Secondary | ICD-10-CM | POA: Diagnosis not present

## 2023-02-21 DIAGNOSIS — D225 Melanocytic nevi of trunk: Secondary | ICD-10-CM | POA: Diagnosis not present

## 2023-02-21 DIAGNOSIS — D1801 Hemangioma of skin and subcutaneous tissue: Secondary | ICD-10-CM | POA: Diagnosis not present

## 2023-02-21 DIAGNOSIS — L280 Lichen simplex chronicus: Secondary | ICD-10-CM | POA: Diagnosis not present

## 2023-02-21 DIAGNOSIS — L814 Other melanin hyperpigmentation: Secondary | ICD-10-CM | POA: Diagnosis not present

## 2023-03-14 ENCOUNTER — Telehealth: Payer: Self-pay | Admitting: Family Medicine

## 2023-03-14 NOTE — Telephone Encounter (Signed)
Contacted Tyler Estrada to schedule their annual wellness visit. Welcome to Medicare visit Due by 11/2023.  Sherol Dade; Care Guide Ambulatory Clinical [facility] Direct Dial: [phone]

## 2023-03-27 ENCOUNTER — Encounter: Payer: Self-pay | Admitting: Family Medicine

## 2023-03-27 DIAGNOSIS — Z5181 Encounter for therapeutic drug level monitoring: Secondary | ICD-10-CM

## 2023-03-27 DIAGNOSIS — U071 COVID-19: Secondary | ICD-10-CM

## 2023-03-28 ENCOUNTER — Other Ambulatory Visit (INDEPENDENT_AMBULATORY_CARE_PROVIDER_SITE_OTHER): Payer: Medicare HMO

## 2023-03-28 DIAGNOSIS — U071 COVID-19: Secondary | ICD-10-CM | POA: Diagnosis not present

## 2023-03-28 DIAGNOSIS — Z5181 Encounter for therapeutic drug level monitoring: Secondary | ICD-10-CM | POA: Diagnosis not present

## 2023-03-28 LAB — COMPREHENSIVE METABOLIC PANEL
ALT: 22 U/L (ref 0–53)
AST: 20 U/L (ref 0–37)
Albumin: 4 g/dL (ref 3.5–5.2)
Alkaline Phosphatase: 76 U/L (ref 39–117)
BUN: 11 mg/dL (ref 6–23)
CO2: 27 mEq/L (ref 19–32)
Calcium: 9 mg/dL (ref 8.4–10.5)
Chloride: 105 mEq/L (ref 96–112)
Creatinine, Ser: 0.75 mg/dL (ref 0.40–1.50)
GFR: 94.17 mL/min (ref >60.00)
Glucose, Bld: 112 mg/dL — ABNORMAL HIGH (ref 70–99)
Potassium: 3.3 mEq/L — ABNORMAL LOW (ref 3.5–5.1)
Sodium: 140 mEq/L (ref 135–145)
Total Bilirubin: 0.5 mg/dL (ref 0.2–1.2)
Total Protein: 6.6 g/dL (ref 6.0–8.3)

## 2023-03-28 MED ORDER — NIRMATRELVIR/RITONAVIR (PAXLOVID)TABLET: 3.0000 | ORAL_TABLET | Freq: Two times a day (BID) | ORAL | 0 refills | Status: AC

## 2023-03-28 NOTE — Addendum Note (Signed)
Addended by: Abbe Amsterdam C on: 03/28/2023 01:23 PM   Modules accepted: Orders

## 2023-03-30 ENCOUNTER — Encounter: Payer: Self-pay | Admitting: Family Medicine

## 2023-04-12 ENCOUNTER — Encounter: Payer: Self-pay | Admitting: Family Medicine

## 2023-04-12 NOTE — Progress Notes (Signed)
[facility] [address], [address] [place], [place] [address] [phone]  Date:  04/18/2023   Name:  Tyler Estrada   DOB:  01/29/1956   MRN:  [id number]  PCP:  Pearline Cables, MD    Chief Complaint: No chief complaint on file.   History of Present Illness:  Tyler Estrada is a 67 y.o. very pleasant male patient who presents with the following:  Pt seen today to discuss vaccination Last seen by myself in October  History of hypertension, CAD with RCA stent 2008, prediabetes, hyperlipidemia, sleep apnea treated with oral appliance, vitamin D deficiency. He underwent a C4-7 decompression in April of 2022 and has done overall quite well from that standpoint He also has rheumatoid arthritis using methotrexate per rheumatology  He sees GI- Dr Vida Rigger- for UC   He notes he is still coughing some from covid that he had about one month ago Otherwise he feels pretty well -he does feel that he is improving  He had some blood work done recently at an outside clinic, he will send me a copy of these labs.  He has not done an A1c however recently We went over his vaccinations.  I recommended that he have a dose of RSV and also the Shingrix series at his convenience    Lab Results  Component Value Date   HGBA1C 5.9 09/23/2021     Patient Active Problem List   Diagnosis Date Noted   Drug-induced gynecomastia 10/15/2022   Pre-op evaluation 12/01/2020   Essential hypertension 12/01/2020   Cervical spondylosis with myelopathy and radiculopathy 03/26/2020   Prostate cancer (HCC) 04/12/2019   Obese 12/01/2018   S/P left UKR 11/30/2018   H/O pericarditis    Elevated troponin 11/19/2015   Dyslipidemia, goal LDL below 70    CAD S/P percutaneous coronary angioplasty 11/18/2015   Chest pain 11/18/2015   Iron deficiency anemia due to chronic blood loss 09/10/2015   Ulcerative colitis with complication (HCC) 09/10/2015    Past  Medical History:  Diagnosis Date   Arthritis    knees, hips, hands   Complication of anesthesia    Woke up during hip replacement, had surgery since with no problems   Coronary artery disease    a. s/p DES to RCA in 2008;  b. 10/2015 Cath: LM nl, LAD 50ost/p, OM1/2 min irregs, RCA 53m ISR, 19m/d ISR, EF 65%.   Echocardiogram    Echo 11/16: EF 60-65, normal wall motion, grade 1 diastolic dysfunction   Grade I diastolic dysfunction 11/19/2015   ECHO   Hyperlipidemia    Iron deficiency anemia due to chronic blood loss 09/10/2015   Melanoma (HCC)    Nose   Pericarditis    a. 10/2015->colchicine.   Prostate cancer (HCC)    Ulcerative colitis with complication (HCC) 09/10/2015    Past Surgical History:  Procedure Laterality Date   ANTERIOR CERVICAL DECOMP/DISCECTOMY FUSION N/A 03/26/2020   Procedure: Cervical Four-Five, Cervical Five-Six, Cervical Six-Seven Anterior Cervical Decompression/Discectomy/Fusion;  Surgeon: Coletta Memos, MD;  Location: [facility] OR;  Service: Neurosurgery;  Laterality: N/A;  3C   CARDIAC CATHETERIZATION N/A 11/18/2015   Procedure: Left Heart Cath and Coronary Angiography;  Surgeon: Tonny Bollman, MD;  Location: [facility] INVASIVE CV LAB;  Service: Cardiovascular;  Laterality: N/A;   CARPAL TUNNEL RELEASE Right 03/26/2020   Procedure: CARPAL TUNNEL RELEASE;  Surgeon: Coletta Memos, MD;  Location: [facility] OR;  Service: Neurosurgery;  Laterality: Right;  CERVICAL SPINE SURGERY     COLONOSCOPY  2019   CORONARY ANGIOPLASTY WITH STENT PLACEMENT  2008   a. Cordis stent to RCA in 2008 2.36mm x 18mm   HIP ARTHROPLASTY Right    MOHS SURGERY     PARTIAL KNEE ARTHROPLASTY Left 11/30/2018   Procedure: LEFT UNICOMPARTMENTAL KNEE Medially;  Surgeon: Durene Romans, MD;  Location: [facility] ORS;  Service: Orthopedics;  Laterality: Left;  70 mins with block   RADIOACTIVE SEED IMPLANT N/A 08/10/2019   Procedure: RADIOACTIVE SEED IMPLANT/BRACHYTHERAPY IMPLANT;  Surgeon: Sebastian Ache, MD;  Location:  [facility];  Service: Urology;  Laterality: N/A;   SPACE OAR INSTILLATION N/A 08/10/2019   Procedure: SPACE OAR INSTILLATION;  Surgeon: Sebastian Ache, MD;  Location: [facility];  Service: Urology;  Laterality: N/A;   TONSILLECTOMY     childhood    Social History   Tobacco Use   Smoking status: Never   Smokeless tobacco: Never  Vaping Use   Vaping Use: Never used  Substance Use Topics   Alcohol use: Yes    Alcohol/week: 0.0 standard drinks of alcohol    Comment: 1-2 drinks per week.   Drug use: No    Family History  Problem Relation Age of Onset   Lymphoma Mother    Hypertension Mother    Arthritis Mother    Cancer Mother    CAD Father    Heart disease Father    Heart attack Father    Arthritis Sister    Heart disease Sister    Hypertension Brother     Allergies  Allergen Reactions   Prednisone     Blurred vision     Medication list has been reviewed and updated.  Current Outpatient Medications on File Prior to Visit  Medication Sig Dispense Refill   amLODipine (NORVASC) 2.5 MG tablet Take 0.5 tablets (1.25 mg total) by mouth daily. 45 tablet 3   aspirin EC 81 MG tablet Take 81 mg by mouth at bedtime.      furosemide (LASIX) 20 MG tablet TAKE 1 TABLET BY MOUTH DAILY AS NEEDED FOR SWELLING OF LEG 90 tablet 0   loratadine (CLARITIN) 10 MG tablet Take 10 mg by mouth daily.     meloxicam (MOBIC) 7.5 MG tablet TAKE 1 TABLET (7.5 MG TOTAL) BY MOUTH DAILY. USE AS NEEDED FOR ARTHRITIS 30 tablet 6   mesalamine (LIALDA) 1.2 g EC tablet Take 1.2 g by mouth in the morning and at bedtime.      methotrexate (RHEUMATREX) 2.5 MG tablet Take 6 tablets (15 mg total) by mouth once a week. Caution:Chemotherapy. Protect from light. 20 tablet 0   montelukast (SINGULAIR) 10 MG tablet TAKE ONE TABLET BY MOUTH EVERY NIGHT AT BEDTIME 90 tablet 3   Multiple Vitamins-Minerals (CENTRUM SILVER PO) Take 1 tablet by mouth daily.      nitroGLYCERIN (NITROSTAT) 0.4  MG SL tablet Place 1 tablet (0.4 mg total) under the tongue every 5 (five) minutes as needed for chest pain. 25 tablet 3   rosuvastatin (CRESTOR) 20 MG tablet TAKE ONE TABLET BY MOUTH DAILY 90 tablet 3   tamsulosin (FLOMAX) 0.4 MG CAPS capsule Take 0.4 mg by mouth daily.     triamcinolone ointment (KENALOG) 0.5 % Apply 1 application topically 2 (two) times daily. Use as needed for skin rash 60 g 1   No current facility-administered medications on file prior to visit.    Review of Systems:  As per HPI- otherwise negative.  Physical Examination: Vitals:   04/18/23 1301  BP: 130/80  Pulse: 63  Resp: 18  Temp: 97.6 F (36.4 C)  SpO2: 98%   There were no vitals filed for this visit. There is no height or weight on file to calculate BMI. Ideal Body Weight:    GEN: no acute distress.  Obese, looks well HEENT: Atraumatic, Normocephalic.  Ears and Nose: No external deformity. CV: RRR, No M/G/R. No JVD. No thrill. No extra heart sounds. PULM: CTA B, no wheezes, crackles, rhonchi. No retractions. No resp. distress. No accessory muscle use.  Lungs are clear ABD: S, NT, ND, +BS. No rebound. No HSM. EXTR: No c/c/e PSYCH: Normally interactive. Conversant.    Assessment and Plan: Immunization due  Patient seen today to discuss immunizations.  I recommended 1 dose of RSV, and the shingles vaccine series at his convenience. He will send me recent labs which we can add to his chart   Signed Abbe Amsterdam, MD

## 2023-04-13 DIAGNOSIS — M0609 Rheumatoid arthritis without rheumatoid factor, multiple sites: Secondary | ICD-10-CM | POA: Diagnosis not present

## 2023-04-13 DIAGNOSIS — K5180 Other ulcerative colitis without complications: Secondary | ICD-10-CM | POA: Diagnosis not present

## 2023-04-13 DIAGNOSIS — E669 Obesity, unspecified: Secondary | ICD-10-CM | POA: Diagnosis not present

## 2023-04-13 DIAGNOSIS — Z6832 Body mass index (BMI) 32.0-32.9, adult: Secondary | ICD-10-CM | POA: Diagnosis not present

## 2023-04-13 DIAGNOSIS — C61 Malignant neoplasm of prostate: Secondary | ICD-10-CM | POA: Diagnosis not present

## 2023-04-13 LAB — LAB REPORT - SCANNED: EGFR: 101

## 2023-04-18 ENCOUNTER — Ambulatory Visit (INDEPENDENT_AMBULATORY_CARE_PROVIDER_SITE_OTHER): Payer: Medicare HMO | Admitting: Family Medicine

## 2023-04-18 VITALS — BP 130/80 | HR 63 | Temp 97.6°F | Resp 18 | Ht 64.0 in | Wt 184.6 lb

## 2023-04-18 DIAGNOSIS — Z7185 Encounter for immunization safety counseling: Secondary | ICD-10-CM | POA: Diagnosis not present

## 2023-04-18 DIAGNOSIS — Z23 Encounter for immunization: Secondary | ICD-10-CM

## 2023-04-18 NOTE — Patient Instructions (Addendum)
I would suggest getting a dose of RSV and the 2 dose Shingrix series at your convenience  I would also like to draw an A1c for you the next time we get blood   Please let me know if your cough is not resolving over the next few weeks after covid

## 2023-06-08 DIAGNOSIS — H524 Presbyopia: Secondary | ICD-10-CM | POA: Diagnosis not present

## 2023-06-15 DIAGNOSIS — M7061 Trochanteric bursitis, right hip: Secondary | ICD-10-CM | POA: Diagnosis not present

## 2023-06-15 DIAGNOSIS — Z96641 Presence of right artificial hip joint: Secondary | ICD-10-CM | POA: Diagnosis not present

## 2023-07-17 NOTE — Progress Notes (Unsigned)
[facility] at [facility] [address], [address] [place], [place] [address] [phone]  Date:  07/18/2023   Name:  Tyler Estrada   DOB:  Jun 29, 1956   MRN:  [id number]  PCP:  Pearline Cables, MD    Chief Complaint: No chief complaint on file.   History of Present Illness:  Rudransh Annen is a 67 y.o. very pleasant male patient who presents with the following:  Patient is seen today for follow-up on PSA Most recent visit with myself was in April  History of hypertension, CAD with RCA stent 2008, prediabetes, hyperlipidemia, sleep apnea treated with oral appliance, vitamin D deficiency. He underwent a C4-7 decompression in April of 2022 and has done overall quite well from that standpoint He also has rheumatoid arthritis using methotrexate per rheumatology  Lab Results  Component Value Date   PSA1 94.7 (H) 01/15/2019   PSA 0.00 (L) 02/08/2020   History of prostate cancer managed per urology, Dr. Berneice Heinrich   Patient Active Problem List   Diagnosis Date Noted   Drug-induced gynecomastia 10/15/2022   Pre-op evaluation 12/01/2020   Essential hypertension 12/01/2020   Cervical spondylosis with myelopathy and radiculopathy 03/26/2020   Prostate cancer (HCC) 04/12/2019   Obese 12/01/2018   S/P left UKR 11/30/2018   H/O pericarditis    Elevated troponin 11/19/2015   Dyslipidemia, goal LDL below 70    CAD S/P percutaneous coronary angioplasty 11/18/2015   Chest pain 11/18/2015   Iron deficiency anemia due to chronic blood loss 09/10/2015   Ulcerative colitis with complication (HCC) 09/10/2015    Past Medical History:  Diagnosis Date   Arthritis    knees, hips, hands   Complication of anesthesia    Woke up during hip replacement, had surgery since with no problems   Coronary artery disease    a. s/p DES to RCA in 2008;  b. 10/2015 Cath: LM nl, LAD 50ost/p, OM1/2 min irregs, RCA 22m ISR, 87m/d ISR, EF 65%.   Echocardiogram    Echo  11/16: EF 60-65, normal wall motion, grade 1 diastolic dysfunction   Grade I diastolic dysfunction 11/19/2015   ECHO   Hyperlipidemia    Iron deficiency anemia due to chronic blood loss 09/10/2015   Melanoma (HCC)    Nose   Pericarditis    a. 10/2015->colchicine.   Prostate cancer (HCC)    Ulcerative colitis with complication (HCC) 09/10/2015    Past Surgical History:  Procedure Laterality Date   ANTERIOR CERVICAL DECOMP/DISCECTOMY FUSION N/A 03/26/2020   Procedure: Cervical Four-Five, Cervical Five-Six, Cervical Six-Seven Anterior Cervical Decompression/Discectomy/Fusion;  Surgeon: Coletta Memos, MD;  Location: [facility] OR;  Service: Neurosurgery;  Laterality: N/A;  3C   CARDIAC CATHETERIZATION N/A 11/18/2015   Procedure: Left Heart Cath and Coronary Angiography;  Surgeon: Tonny Bollman, MD;  Location: [facility] INVASIVE CV LAB;  Service: Cardiovascular;  Laterality: N/A;   CARPAL TUNNEL RELEASE Right 03/26/2020   Procedure: CARPAL TUNNEL RELEASE;  Surgeon: Coletta Memos, MD;  Location: [facility] OR;  Service: Neurosurgery;  Laterality: Right;   CERVICAL SPINE SURGERY     COLONOSCOPY  2019   CORONARY ANGIOPLASTY WITH STENT PLACEMENT  2008   a. Cordis stent to RCA in 2008 2.56mm x 18mm   HIP ARTHROPLASTY Right    MOHS SURGERY     PARTIAL KNEE ARTHROPLASTY Left 11/30/2018   Procedure: LEFT UNICOMPARTMENTAL KNEE Medially;  Surgeon: Durene Romans, MD;  Location: [facility] ORS;  Service: Orthopedics;  Laterality: Left;  70 mins with block   RADIOACTIVE SEED IMPLANT N/A 08/10/2019   Procedure: RADIOACTIVE SEED IMPLANT/BRACHYTHERAPY IMPLANT;  Surgeon: Sebastian Ache, MD;  Location: [facility];  Service: Urology;  Laterality: N/A;   SPACE OAR INSTILLATION N/A 08/10/2019   Procedure: SPACE OAR INSTILLATION;  Surgeon: Sebastian Ache, MD;  Location: [facility];  Service: Urology;  Laterality: N/A;   TONSILLECTOMY     childhood    Social History   Tobacco Use   Smoking status: Never    Smokeless tobacco: Never  Vaping Use   Vaping status: Never Used  Substance Use Topics   Alcohol use: Yes    Alcohol/week: 0.0 standard drinks of alcohol    Comment: 1-2 drinks per week.   Drug use: No    Family History  Problem Relation Age of Onset   Lymphoma Mother    Hypertension Mother    Arthritis Mother    Cancer Mother    CAD Father    Heart disease Father    Heart attack Father    Arthritis Sister    Heart disease Sister    Hypertension Brother     Allergies  Allergen Reactions   Prednisone     Blurred vision     Medication list has been reviewed and updated.  Current Outpatient Medications on File Prior to Visit  Medication Sig Dispense Refill   amLODipine (NORVASC) 2.5 MG tablet Take 0.5 tablets (1.25 mg total) by mouth daily. 45 tablet 3   aspirin EC 81 MG tablet Take 81 mg by mouth at bedtime.      furosemide (LASIX) 20 MG tablet TAKE 1 TABLET BY MOUTH DAILY AS NEEDED FOR SWELLING OF LEG 90 tablet 0   loratadine (CLARITIN) 10 MG tablet Take 10 mg by mouth daily.     meloxicam (MOBIC) 7.5 MG tablet TAKE 1 TABLET (7.5 MG TOTAL) BY MOUTH DAILY. USE AS NEEDED FOR ARTHRITIS 30 tablet 6   mesalamine (LIALDA) 1.2 g EC tablet Take 1.2 g by mouth in the morning and at bedtime.      methotrexate (RHEUMATREX) 2.5 MG tablet Take 6 tablets (15 mg total) by mouth once a week. Caution:Chemotherapy. Protect from light. 20 tablet 0   montelukast (SINGULAIR) 10 MG tablet TAKE ONE TABLET BY MOUTH EVERY NIGHT AT BEDTIME 90 tablet 3   nitroGLYCERIN (NITROSTAT) 0.4 MG SL tablet Place 1 tablet (0.4 mg total) under the tongue every 5 (five) minutes as needed for chest pain. 25 tablet 3   rosuvastatin (CRESTOR) 20 MG tablet TAKE ONE TABLET BY MOUTH DAILY 90 tablet 3   tamsulosin (FLOMAX) 0.4 MG CAPS capsule Take 0.4 mg by mouth daily.     triamcinolone ointment (KENALOG) 0.5 % Apply 1 application topically 2 (two) times daily. Use as needed for skin rash 60 g 1   No current  facility-administered medications on file prior to visit.    Review of Systems:  As per HPI- otherwise negative.   Physical Examination: There were no vitals filed for this visit. There were no vitals filed for this visit. There is no height or weight on file to calculate BMI. Ideal Body Weight:    GEN: no acute distress. HEENT: Atraumatic, Normocephalic.  Ears and Nose: No external deformity. CV: RRR, No M/G/R. No JVD. No thrill. No extra heart sounds. PULM: CTA B, no wheezes, crackles, rhonchi. No retractions. No resp. distress. No accessory muscle use. ABD: S, NT, ND, +BS. No rebound. No HSM. EXTR: No  c/c/e PSYCH: Normally interactive. Conversant.    Assessment and Plan: ***  Signed Abbe Amsterdam, MD

## 2023-07-18 ENCOUNTER — Ambulatory Visit (INDEPENDENT_AMBULATORY_CARE_PROVIDER_SITE_OTHER): Payer: Medicare HMO | Admitting: Family Medicine

## 2023-07-18 VITALS — BP 136/80 | HR 74 | Temp 98.1°F | Resp 18 | Ht 64.0 in | Wt 185.8 lb

## 2023-07-18 DIAGNOSIS — I10 Essential (primary) hypertension: Secondary | ICD-10-CM

## 2023-07-18 DIAGNOSIS — C61 Malignant neoplasm of prostate: Secondary | ICD-10-CM | POA: Diagnosis not present

## 2023-07-18 DIAGNOSIS — R7303 Prediabetes: Secondary | ICD-10-CM

## 2023-07-18 DIAGNOSIS — M0609 Rheumatoid arthritis without rheumatoid factor, multiple sites: Secondary | ICD-10-CM | POA: Diagnosis not present

## 2023-07-18 DIAGNOSIS — E785 Hyperlipidemia, unspecified: Secondary | ICD-10-CM

## 2023-07-18 LAB — CBC AND DIFFERENTIAL
HCT: 36 — AB (ref 41–53)
Hemoglobin: 11.6 — AB (ref 13.5–17.5)
Platelets: 199 10*3/uL (ref 150–400)
WBC: 4.2

## 2023-07-18 LAB — CBC: RBC: 3.86 — AB (ref 3.87–5.11)

## 2023-07-18 LAB — HEPATIC FUNCTION PANEL
ALT: 19 U/L (ref 10–40)
AST: 21 (ref 14–40)
Alkaline Phosphatase: 71 (ref 25–125)
Bilirubin, Total: 1.7

## 2023-07-18 LAB — BASIC METABOLIC PANEL
BUN: 13 (ref 4–21)
CO2: 21 (ref 13–22)
Chloride: 108 (ref 99–108)
Creatinine: 0.7 (ref 0.6–1.3)
Glucose: 103
Potassium: 4 mEq/L (ref 3.5–5.1)
Sodium: 143 (ref 137–147)

## 2023-07-18 LAB — COMPREHENSIVE METABOLIC PANEL
Albumin: 4.4 (ref 3.5–5.0)
Calcium: 9 (ref 8.7–10.7)
Globulin: 1.4
eGFR: 102

## 2023-07-18 NOTE — Patient Instructions (Signed)
Good to see you today- I will be in touch with your labs asap

## 2023-07-19 ENCOUNTER — Encounter: Payer: Self-pay | Admitting: Family Medicine

## 2023-08-21 NOTE — Progress Notes (Unsigned)
[facility] at [facility] [address] [place], [place] [address] [phone]  Date:  08/24/2023   Name:  Tyler Estrada   DOB:  August 31, 1956   MRN:  [id number]  PCP:  Pearline Cables, MD    Chief Complaint: nodule on earlobe (Pt says he noticed it about 3 weeks ago. /(AWV, Shingrix, Flu due))   History of Present Illness:  Tyler Estrada is a 67 y.o. very pleasant male patient who presents with the following:  Patient seen today with concern of a bump on his right earlobe-it seemed to appear about 3 weeks ago.  It is not particularly tender or bothersome, he just notes that it looks different than it had before No injury, he is not sure what could be causing this Hearing is normal  Most recent visit with myself was in July follow-up PSA-he was concerned his levels could be coming up following completion of androgen deprivation therapy However, fortunately his PSA was still 0.0  History of hypertension, CAD with RCA stent 2008, prediabetes, hyperlipidemia, sleep apnea treated with oral appliance, vitamin D deficiency. He underwent a C4-7 decompression in April of 2022 and has done overall quite well from that standpoint He also has rheumatoid arthritis using methotrexate per rheumatology  Lab Results  Component Value Date   PSA1 94.7 (H) 01/15/2019   PSA 0.00 Repeated and verified X2. (L) 07/18/2023   PSA 0.00 (L) 02/08/2020    Recommend flu shot, COVID booster-we discussed timing of these shots, he plans to do ASAP prior to a cruise in mid September Shingles vaccine series  Patient Active Problem List   Diagnosis Date Noted   Drug-induced gynecomastia 10/15/2022   Pre-op evaluation 12/01/2020   Essential hypertension 12/01/2020   Cervical spondylosis with myelopathy and radiculopathy 03/26/2020   Prostate cancer (HCC) 04/12/2019   Obese 12/01/2018   S/P left UKR 11/30/2018   H/O pericarditis    Elevated troponin  11/19/2015   Dyslipidemia, goal LDL below 70    CAD S/P percutaneous coronary angioplasty 11/18/2015   Chest pain 11/18/2015   Iron deficiency anemia due to chronic blood loss 09/10/2015   Ulcerative colitis with complication (HCC) 09/10/2015    Past Medical History:  Diagnosis Date   Arthritis    knees, hips, hands   Complication of anesthesia    Woke up during hip replacement, had surgery since with no problems   Coronary artery disease    a. s/p DES to RCA in 2008;  b. 10/2015 Cath: LM nl, LAD 50ost/p, OM1/2 min irregs, RCA 85m ISR, 60m/d ISR, EF 65%.   Echocardiogram    Echo 11/16: EF 60-65, normal wall motion, grade 1 diastolic dysfunction   Grade I diastolic dysfunction 11/19/2015   ECHO   Hyperlipidemia    Iron deficiency anemia due to chronic blood loss 09/10/2015   Melanoma (HCC)    Nose   Pericarditis    a. 10/2015->colchicine.   Prostate cancer (HCC)    Ulcerative colitis with complication (HCC) 09/10/2015    Past Surgical History:  Procedure Laterality Date   ANTERIOR CERVICAL DECOMP/DISCECTOMY FUSION N/A 03/26/2020   Procedure: Cervical Four-Five, Cervical Five-Six, Cervical Six-Seven Anterior Cervical Decompression/Discectomy/Fusion;  Surgeon: Coletta Memos, MD;  Location: [facility] OR;  Service: Neurosurgery;  Laterality: N/A;  3C   CARDIAC CATHETERIZATION N/A 11/18/2015   Procedure: Left Heart Cath and Coronary Angiography;  Surgeon: Tonny Bollman, MD;  Location: [facility] INVASIVE CV LAB;  Service: Cardiovascular;  Laterality: N/A;   CARPAL TUNNEL RELEASE Right 03/26/2020   Procedure: CARPAL TUNNEL RELEASE;  Surgeon: Coletta Memos, MD;  Location: [facility] OR;  Service: Neurosurgery;  Laterality: Right;   CERVICAL SPINE SURGERY     COLONOSCOPY  2019   CORONARY ANGIOPLASTY WITH STENT PLACEMENT  2008   a. Cordis stent to RCA in 2008 2.20mm x 18mm   HIP ARTHROPLASTY Right    MOHS SURGERY     PARTIAL KNEE ARTHROPLASTY Left 11/30/2018   Procedure: LEFT UNICOMPARTMENTAL KNEE Medially;   Surgeon: Durene Romans, MD;  Location: [facility]S;  Service: Orthopedics;  Laterality: Left;  70 mins with block   RADIOACTIVE SEED IMPLANT N/A 08/10/2019   Procedure: RADIOACTIVE SEED IMPLANT/BRACHYTHERAPY IMPLANT;  Surgeon: Sebastian Ache, MD;  Location: [facility];  Service: Urology;  Laterality: N/A;   SPACE OAR INSTILLATION N/A 08/10/2019   Procedure: SPACE OAR INSTILLATION;  Surgeon: Sebastian Ache, MD;  Location: [facility];  Service: Urology;  Laterality: N/A;   TONSILLECTOMY     childhood    Social History   Tobacco Use   Smoking status: Never   Smokeless tobacco: Never  Vaping Use   Vaping status: Never Used  Substance Use Topics   Alcohol use: Yes    Alcohol/week: 0.0 standard drinks of alcohol    Comment: 1-2 drinks per week.   Drug use: No    Family History  Problem Relation Age of Onset   Lymphoma Mother    Hypertension Mother    Arthritis Mother    Cancer Mother    CAD Father    Heart disease Father    Heart attack Father    Arthritis Sister    Heart disease Sister    Hypertension Brother     Allergies  Allergen Reactions   Prednisone     Blurred vision     Medication list has been reviewed and updated.  Current Outpatient Medications on File Prior to Visit  Medication Sig Dispense Refill   amLODipine (NORVASC) 2.5 MG tablet Take 0.5 tablets (1.25 mg total) by mouth daily. 45 tablet 3   aspirin EC 81 MG tablet Take 81 mg by mouth at bedtime.      furosemide (LASIX) 20 MG tablet TAKE 1 TABLET BY MOUTH DAILY AS NEEDED FOR SWELLING OF LEG 90 tablet 0   loratadine (CLARITIN) 10 MG tablet Take 10 mg by mouth daily.     meloxicam (MOBIC) 7.5 MG tablet TAKE 1 TABLET (7.5 MG TOTAL) BY MOUTH DAILY. USE AS NEEDED FOR ARTHRITIS 30 tablet 6   mesalamine (LIALDA) 1.2 g EC tablet Take 1.2 g by mouth in the morning and at bedtime.      methotrexate (RHEUMATREX) 2.5 MG tablet Take 6 tablets (15 mg total) by mouth once a week.  Caution:Chemotherapy. Protect from light. 20 tablet 0   montelukast (SINGULAIR) 10 MG tablet TAKE ONE TABLET BY MOUTH EVERY NIGHT AT BEDTIME 90 tablet 3   nitroGLYCERIN (NITROSTAT) 0.4 MG SL tablet Place 1 tablet (0.4 mg total) under the tongue every 5 (five) minutes as needed for chest pain. 25 tablet 3   rosuvastatin (CRESTOR) 20 MG tablet TAKE ONE TABLET BY MOUTH DAILY 90 tablet 3   tamsulosin (FLOMAX) 0.4 MG CAPS capsule Take 0.4 mg by mouth daily.     triamcinolone ointment (KENALOG) 0.5 % Apply 1 application topically 2 (two) times daily. Use as needed for skin rash 60 g 1   No current facility-administered medications on file  prior to visit.    Review of Systems:  As per HPI- otherwise negative.   Physical Examination: Vitals:   08/24/23 1328  BP: 130/82  Pulse: 69  Resp: 18  Temp: 97.9 F (36.6 C)  SpO2: 99%   Vitals:   08/24/23 1328  Weight: 186 lb (84.4 kg)  Height: 5\' 4"  (1.626 m)   Body mass index is 31.93 kg/m. Ideal Body Weight: Weight in (lb) to have BMI = 25: 145.3  GEN: no acute distress.  Mildly obese, looks well Bilateral TM wnl, oropharynx normal.  PEERL,EOMI.   The right external ear over the superior lobe displays a likely cyst, approximately 8 mm in diameter It is not tender, not hot or red.  No fluctuance or change in skin texture but I can palpate a likely cyst under the skin HEENT: Atraumatic, Normocephalic.  Ears and Nose: No external deformity. CV: RRR, No M/G/R. No JVD. No thrill. No extra heart sounds. PULM: CTA B, no wheezes, crackles, rhonchi. No retractions. No resp. distress. No accessory muscle use. EXTR: No c/c/e PSYCH: Normally interactive. Conversant.    Assessment and Plan: Cyst of skin  Immunization due Patient seen today with likely cyst over the right earlobe.  We discussed potentially getting ultrasound to confirm this is a cyst.  At this time, patient prefers to observe.  It is not painful or otherwise bothering him, a  serious condition such as cancer is highly unlikely  He will let me know if this is getting larger or if he decides to go ahead with ultrasound  Discussed immunizations, he plans to get his flu shot and COVID booster at his pharmacy ASAP  Signed Abbe Amsterdam, MD

## 2023-08-24 ENCOUNTER — Ambulatory Visit (INDEPENDENT_AMBULATORY_CARE_PROVIDER_SITE_OTHER): Payer: Medicare HMO | Admitting: Family Medicine

## 2023-08-24 VITALS — BP 130/82 | HR 69 | Temp 97.9°F | Resp 18 | Ht 64.0 in | Wt 186.0 lb

## 2023-08-24 DIAGNOSIS — Z23 Encounter for immunization: Secondary | ICD-10-CM

## 2023-08-24 DIAGNOSIS — L729 Follicular cyst of the skin and subcutaneous tissue, unspecified: Secondary | ICD-10-CM | POA: Diagnosis not present

## 2023-11-03 DIAGNOSIS — Z85828 Personal history of other malignant neoplasm of skin: Secondary | ICD-10-CM | POA: Diagnosis not present

## 2023-11-03 DIAGNOSIS — L280 Lichen simplex chronicus: Secondary | ICD-10-CM | POA: Diagnosis not present

## 2023-11-14 DIAGNOSIS — Z6832 Body mass index (BMI) 32.0-32.9, adult: Secondary | ICD-10-CM | POA: Diagnosis not present

## 2023-11-14 DIAGNOSIS — M0609 Rheumatoid arthritis without rheumatoid factor, multiple sites: Secondary | ICD-10-CM | POA: Diagnosis not present

## 2023-11-14 DIAGNOSIS — E669 Obesity, unspecified: Secondary | ICD-10-CM | POA: Diagnosis not present

## 2023-11-28 ENCOUNTER — Encounter: Payer: Self-pay | Admitting: Family Medicine

## 2023-11-29 NOTE — Telephone Encounter (Signed)
Do you recommend pt speak to Rheum?

## 2023-11-30 DIAGNOSIS — K6289 Other specified diseases of anus and rectum: Secondary | ICD-10-CM | POA: Diagnosis not present

## 2023-11-30 DIAGNOSIS — K5100 Ulcerative (chronic) pancolitis without complications: Secondary | ICD-10-CM | POA: Diagnosis not present

## 2023-11-30 DIAGNOSIS — K5289 Other specified noninfective gastroenteritis and colitis: Secondary | ICD-10-CM | POA: Diagnosis not present

## 2023-11-30 DIAGNOSIS — K6389 Other specified diseases of intestine: Secondary | ICD-10-CM | POA: Diagnosis not present

## 2023-12-01 ENCOUNTER — Encounter: Payer: Self-pay | Admitting: Family Medicine

## 2023-12-02 DIAGNOSIS — K5289 Other specified noninfective gastroenteritis and colitis: Secondary | ICD-10-CM | POA: Diagnosis not present

## 2023-12-06 LAB — HM COLONOSCOPY

## 2023-12-14 ENCOUNTER — Other Ambulatory Visit: Payer: Self-pay | Admitting: Family Medicine

## 2023-12-14 ENCOUNTER — Other Ambulatory Visit: Payer: Self-pay | Admitting: Internal Medicine

## 2023-12-14 DIAGNOSIS — L309 Dermatitis, unspecified: Secondary | ICD-10-CM

## 2023-12-27 ENCOUNTER — Encounter: Payer: Self-pay | Admitting: Family Medicine

## 2023-12-27 ENCOUNTER — Ambulatory Visit (HOSPITAL_BASED_OUTPATIENT_CLINIC_OR_DEPARTMENT_OTHER)
Admission: RE | Admit: 2023-12-27 | Discharge: 2023-12-27 | Disposition: A | Discharge status: Home / Self Care | Payer: Medicare HMO | Source: Ambulatory Visit | Attending: Family Medicine | Admitting: Family Medicine

## 2023-12-27 ENCOUNTER — Ambulatory Visit (INDEPENDENT_AMBULATORY_CARE_PROVIDER_SITE_OTHER): Payer: Medicare HMO | Admitting: Family Medicine

## 2023-12-27 ENCOUNTER — Telehealth: Payer: Self-pay

## 2023-12-27 VITALS — BP 127/76 | HR 75 | Ht 64.0 in | Wt 193.0 lb

## 2023-12-27 DIAGNOSIS — R200 Anesthesia of skin: Secondary | ICD-10-CM

## 2023-12-27 DIAGNOSIS — M5412 Radiculopathy, cervical region: Secondary | ICD-10-CM | POA: Diagnosis not present

## 2023-12-27 DIAGNOSIS — R202 Paresthesia of skin: Secondary | ICD-10-CM | POA: Diagnosis not present

## 2023-12-27 DIAGNOSIS — M4802 Spinal stenosis, cervical region: Secondary | ICD-10-CM | POA: Diagnosis not present

## 2023-12-27 MED ORDER — MELOXICAM 7.5 MG PO TABS: 7.5000 mg | ORAL_TABLET | Freq: Every day | ORAL | 6 refills | Status: AC

## 2023-12-27 NOTE — Progress Notes (Signed)
 Acute Office Visit  Subjective:     Patient ID: Tyler Estrada, male    DOB: 1956/08/01, 68 y.o.   MRN: [id number]  Chief Complaint  Patient presents with   Numbness    HPI Patient is in today for right hand numbness/tingling.   Discussed the use of AI scribe software for clinical note transcription with the patient, who gave verbal consent to proceed.  History of Present Illness   The patient, with a history of right carpal tunnel syndrome and cervical spine decompression in 2021, presents with a gradually worsening numbness in the right hand extending up to the forearm over the past several days/week. The numbness is described as a sensation similar to the wearing off of Novocaine, with associated pins and needles. The patient denies any associated weakness or loss of grip strength, but reports a slight discomfort in the elbow.   There is no associated chest pain, dyspnea, speech difficulties, or confusion. The patient denies any recent injuries or heavy lifting prior to the onset of symptoms.  The patient is currently prescribed meloxicam  as needed, but reports not taking it regularly. Needs refill.              ROS All review of systems negative except what is listed in the HPI      Objective:    BP 127/76   Pulse 75   Ht 5' 4 (1.626 m)   Wt 193 lb (87.5 kg)   SpO2 99%   BMI 33.13 kg/m    Physical Exam Vitals reviewed.  Constitutional:      Appearance: Normal appearance.  Musculoskeletal:        General: No swelling, tenderness, deformity or signs of injury. Normal range of motion.     Comments: Mild discomfort to palpation of right medial epicondyle; negative Tinel's & Phalen's  Skin:    General: Skin is warm and dry.     Capillary Refill: Capillary refill takes less than 2 seconds.     Findings: No erythema or rash.  Neurological:     Mental Status: He is alert and oriented to person, place, and time.     Cranial Nerves: No cranial nerve  deficit.     Sensory: No sensory deficit.     Motor: No weakness.     Coordination: Coordination normal.     Gait: Gait normal.  Psychiatric:        Behavior: Behavior normal.        Thought Content: Thought content normal.        Judgment: Judgment normal.     No results found for any visits on 12/27/23.      Assessment & Plan:   Problem List Items Addressed This Visit   None Visit Diagnoses       Numbness and tingling in right hand    -  Primary   Relevant Medications   meloxicam  (MOBIC ) 7.5 MG tablet   Other Relevant Orders   Ambulatory referral to Neurosurgery   DG Cervical Spine Complete      Gradual onset of numbness/tingling in the right hand extending to the forearm over the past few days. No associated weakness or other neurological symptoms. History of right carpal tunnel syndrome and cervical spine decompression. Negative Tinel's and Phalen's today. -Order cervical spine X-ray today to assess for changes. -Restart Meloxicam  for potential inflammation. -Refer back to Dr. Gillie (his previous neurosurgeon) for further evaluation and management -Consider referral to sports medicine or orthopedics if appointment  with Dr. Cabbell is significantly delayed.     Patient aware of signs/symptoms requiring further/urgent evaluation.    Meds ordered this encounter  Medications   meloxicam  (MOBIC ) 7.5 MG tablet    Sig: Take 1 tablet (7.5 mg total) by mouth daily. Use as needed for arthritis    Dispense:  30 tablet    Refill:  6    Supervising Provider:   DOMENICA BLACKBIRD A [[id number]]    Return if symptoms worsen or fail to improve.  Waddell KATHEE Mon, NP

## 2023-12-27 NOTE — Telephone Encounter (Signed)
Appt scheduled w/ Lovena Le today.  ?

## 2023-12-27 NOTE — Telephone Encounter (Signed)
 Initial Comment Caller states his right hand keeps going numb, its been about 4 days now. Translation No Nurse Assessment Nurse: Mat, RN, Maudie Date/Time (Eastern Time): 12/26/2023 3:24:52 PM Confirm and document reason for call. If symptomatic, describe symptoms. ---caller states to schedule appt wit pcp due to right hand numbness for past 4 days. sx are lasting longer denies worsening. numbness and pins/needles tingling. denies other sx or spreading numbness. Does the patient have any new or worsening symptoms? ---Yes Will a triage be completed? ---Yes Related visit to physician within the last 2 weeks? ---No Does the PT have any chronic conditions? (i.e. diabetes, asthma, this includes High risk factors for pregnancy, etc.) ---Yes List chronic conditions. ---ulcerative colitis arthritis Is this a behavioral health or substance abuse call? ---No Guidelines Guideline Title Affirmed Question Affirmed Notes Nurse Date/Time (Eastern Time) Neurologic Deficit [1] Tingling (e.g., pins and needles) of the face, arm / hand, or leg / foot on one side of the body AND [2] present now (Exceptions: Chronic or recurrent sx lasting > 4 weeks; or from Verdin, RN, [facility] 12/26/2023 3:26:50 PM PLEASE NOTE: All timestamps contained within this report are represented as Eastern Standard Time. CONFIDENTIALTY NOTICE: This fax transmission is intended only for the addressee. It contains information that is legally privileged, confidential or otherwise protected from use or disclosure. If you are not the intended recipient, you are strictly prohibited from reviewing, disclosing, copying using or disseminating any of this information or taking any action in reliance on or regarding this information. If you have received this fax in error, please notify us  immediately by telephone so that we can arrange for its return to us . Phone: [phone], Toll-Free: [phone], Fax: [phone] Page: 2 of  2 Call Id: [id number] Guidelines Guideline Title Affirmed Question Affirmed Notes Nurse Date/Time Titus Time) known cause, such as: bumped elbow, carpal tunnel, pinched nerve.) Disp. Time Titus Time) Disposition Final User 12/26/2023 3:29:04 PM See HCP within 4 Hours (or PCP triage) Yes Verdin, RN, Maudie Final Disposition 12/26/2023 3:29:04 PM See HCP within 4 Hours (or PCP triage) Yes Verdin, RN, Maudie Flint Disagree/Comply Comply Caller Understands Yes PreDisposition Call Doctor Care Advice Given Per Guideline SEE HCP (OR PCP TRIAGE) WITHIN 4 HOURS: * IF OFFICE WILL BE OPEN: You need to be seen within the next 3 or 4 hours. Call your doctor (or NP/PA) now or as soon as the office opens. CALL BACK IF: * You become worse CARE ADVICE given per Neurologic Deficit (Adult) guideline. Comments User: Carissa, Verdin, RN Date/Time Titus Time): 12/26/2023 3:32:38 PM RN called backline to advise patient needs to be scheduled per dispo. per backline the office is full today but they will contact him now. Referrals REFERRED TO PCP OFFICE

## 2023-12-29 ENCOUNTER — Ambulatory Visit: Payer: Medicare HMO | Admitting: Family Medicine

## 2023-12-29 ENCOUNTER — Encounter: Payer: Self-pay | Admitting: Family Medicine

## 2024-01-02 ENCOUNTER — Encounter: Payer: Self-pay | Admitting: Family Medicine

## 2024-01-10 DIAGNOSIS — G5621 Lesion of ulnar nerve, right upper limb: Secondary | ICD-10-CM | POA: Diagnosis not present

## 2024-01-10 DIAGNOSIS — G5601 Carpal tunnel syndrome, right upper limb: Secondary | ICD-10-CM | POA: Diagnosis not present

## 2024-01-16 ENCOUNTER — Other Ambulatory Visit: Payer: Self-pay

## 2024-01-16 DIAGNOSIS — R202 Paresthesia of skin: Secondary | ICD-10-CM

## 2024-01-19 ENCOUNTER — Encounter: Payer: Self-pay | Admitting: Family Medicine

## 2024-01-19 ENCOUNTER — Ambulatory Visit (INDEPENDENT_AMBULATORY_CARE_PROVIDER_SITE_OTHER): Payer: Medicare HMO | Admitting: Neurology

## 2024-01-19 ENCOUNTER — Other Ambulatory Visit: Payer: Self-pay | Admitting: Internal Medicine

## 2024-01-19 DIAGNOSIS — R202 Paresthesia of skin: Secondary | ICD-10-CM | POA: Diagnosis not present

## 2024-01-19 NOTE — Procedures (Signed)
  [facility]  [address] [place], [address]  [place], [place] [address] Tel: [phone] Fax: [phone] Test Date:  01/19/2024  Patient: Tyler Estrada DOB: [birth year] Physician: Nita Sickle, DO  Sex: Male Height: 5\' 4"  Ref Phys: Coletta Memos, MD  ID#: [id number]   Technician:    History: This is a 68 year old man with history of CTS release and cervical surgery referred for evaluation of right hand paresthesias.  NCV & EMG Findings: Extensive electrodiagnostic testing of the right upper extremity shows:  Right median, ulnar, and mixed palmar sensory responses are within normal limits. Right median and ulnar motor responses are within normal limits. There is no evidence of active or chronic motor axonal loss changes affecting any of the tested muscles.  Motor unit configuration and recruitment pattern is within normal limits.  Impression: This is a normal study of the right upper extremity.  In particular, there is no evidence of carpal tunnel syndrome, ulnar neuropathy, or cervical radiculopathy.   ___________________________ Nita Sickle, DO    Nerve Conduction Studies   Stim Site NR Peak (ms) Norm Peak (ms) O-P Amp (V) Norm O-P Amp  Right Median Anti Sensory (2nd Digit)  32 C  Wrist    3.3 <3.8 20.5 >10  Right Ulnar Anti Sensory (5th Digit)  32 C  Wrist    2.4 <3.2 15.4 >5     Stim Site NR Onset (ms) Norm Onset (ms) O-P Amp (mV) Norm O-P Amp Site1 Site2 Delta-0 (ms) Dist (cm) Vel (m/s) Norm Vel (m/s)  Right Median Motor (Abd Poll Brev)  32 C  Wrist    3.8 <4.0 6.1 >5 Elbow Wrist 5.0 28.0 56 >50  Elbow    8.8  5.7         Right Ulnar Motor (Abd Dig Minimi)  32 C  Wrist    2.8 <3.1 8.8 >7 B Elbow Wrist 3.4 21.0 62 >50  B Elbow    6.2  6.7  A Elbow B Elbow 1.7 10.0 59 >50  A Elbow    7.9  7.2            Stim Site NR Peak (ms) Norm Peak (ms) P-T Amp (V) Site1 Site2 Delta-P (ms) Norm Delta (ms)  Right Median/Ulnar Palm Comparison (Wrist - 8cm)  32 C   Median Palm    1.7 <2.2 78.0 Median Palm Ulnar Palm 0.0   Ulnar Palm    1.7 <2.2 10.5       Electromyography   Side Muscle Ins.Act Fibs Fasc Recrt Amp Dur Poly Activation Comment  Right 1stDorInt Nml Nml Nml Nml Nml Nml Nml Nml N/A  Right Abd Poll Brev Nml Nml Nml Nml Nml Nml Nml Nml N/A  Right PronatorTeres Nml Nml Nml Nml Nml Nml Nml Nml N/A  Right Biceps Nml Nml Nml Nml Nml Nml Nml Nml N/A  Right Triceps Nml Nml Nml Nml Nml Nml Nml Nml N/A  Right Deltoid Nml Nml Nml Nml Nml Nml Nml Nml N/A  Right Ext Indicis Nml Nml Nml Nml Nml Nml Nml Nml N/A  Right Infraspinatus Nml Nml Nml Nml Nml Nml Nml Nml N/A      Waveforms:

## 2024-01-31 DIAGNOSIS — M5412 Radiculopathy, cervical region: Secondary | ICD-10-CM | POA: Diagnosis not present

## 2024-02-06 ENCOUNTER — Other Ambulatory Visit (HOSPITAL_BASED_OUTPATIENT_CLINIC_OR_DEPARTMENT_OTHER): Payer: Self-pay | Admitting: Neurosurgery

## 2024-02-06 DIAGNOSIS — M5412 Radiculopathy, cervical region: Secondary | ICD-10-CM

## 2024-02-29 DIAGNOSIS — M0609 Rheumatoid arthritis without rheumatoid factor, multiple sites: Secondary | ICD-10-CM | POA: Diagnosis not present

## 2024-03-07 ENCOUNTER — Ambulatory Visit (HOSPITAL_BASED_OUTPATIENT_CLINIC_OR_DEPARTMENT_OTHER)
Admission: RE | Admit: 2024-03-07 | Discharge: 2024-03-07 | Disposition: A | Discharge status: Home / Self Care | Payer: Medicare HMO | Source: Ambulatory Visit | Attending: Neurosurgery | Admitting: Neurosurgery

## 2024-03-07 DIAGNOSIS — Z981 Arthrodesis status: Secondary | ICD-10-CM | POA: Diagnosis not present

## 2024-03-07 DIAGNOSIS — M4802 Spinal stenosis, cervical region: Secondary | ICD-10-CM | POA: Diagnosis not present

## 2024-03-07 DIAGNOSIS — M5023 Other cervical disc displacement, cervicothoracic region: Secondary | ICD-10-CM | POA: Diagnosis not present

## 2024-03-07 DIAGNOSIS — M5412 Radiculopathy, cervical region: Secondary | ICD-10-CM | POA: Insufficient documentation

## 2024-03-07 DIAGNOSIS — M79601 Pain in right arm: Secondary | ICD-10-CM | POA: Diagnosis not present

## 2024-03-18 NOTE — Progress Notes (Unsigned)
 Cardiology Office Note   Date:  03/19/2024   ID:  Tyler Estrada, DOB Dec 24, 1955, MRN [id number]  PCP:  Pearline Cables, MD  Cardiologist:   Dietrich Pates, MD   F/U of CAD and DOE      History of Present Illness: Tyler Estrada is a 68 y.o. male with a history of coronary artery disease, s/p stent to the RCA in 2008, prior pericarditis treated with colchicine, hypertension, hyperlipidemia, ulcerative colitis.  Cardiac catheterization in November 2016 demonstrated patent stents in the RCA and moderate nonobstructive disease in the LAD.  Ejection fraction by echocardiogram in 2016 was normal.      He was seen by Leda Gauze Feb 2023    Complained of DOE at that time      Echo was normal     Stress myoview    Walked almost 6 min   Normal perfusion   NO ischemia  LVEF 51%     I saw the pt in Jan 2024 since seen he has done well.  He denies chest pain.  Breathing is okay.  No significant palpitations.  He remains active goes to [employer] on treadmill.  Goes to sauna  He and his wife got back from a cruise to the [place].  They have an upcoming cruise to [place].  Diet:   Has been on cruises recently eating more than he usually does.   Current Meds  Medication Sig   aspirin EC 81 MG tablet Take 81 mg by mouth at bedtime.    furosemide (LASIX) 20 MG tablet TAKE 1 TABLET BY MOUTH DAILY AS NEEDED FOR SWELLING OF LEG   loratadine (CLARITIN) 10 MG tablet Take 10 mg by mouth daily.   meloxicam (MOBIC) 7.5 MG tablet Take 1 tablet (7.5 mg total) by mouth daily. Use as needed for arthritis   mesalamine (LIALDA) 1.2 g EC tablet Take 1.2 g by mouth in the morning and at bedtime.    methotrexate (RHEUMATREX) 2.5 MG tablet Take 6 tablets (15 mg total) by mouth once a week. Caution:Chemotherapy. Protect from light.   montelukast (SINGULAIR) 10 MG tablet TAKE 1 TABLET BY MOUTH EVERY NIGHT AT BEDTIME   nitroGLYCERIN (NITROSTAT) 0.4 MG SL tablet Place 1 tablet (0.4 mg total) under the tongue every 5  (five) minutes as needed for chest pain.   tamsulosin (FLOMAX) 0.4 MG CAPS capsule Take 0.4 mg by mouth daily.   triamcinolone ointment (KENALOG) 0.5 % Apply 1 application topically 2 (two) times daily. Use as needed for skin rash   [DISCONTINUED] amLODipine (NORVASC) 2.5 MG tablet Take 0.5 tablets (1.25 mg total) by mouth daily.   [DISCONTINUED] rosuvastatin (CRESTOR) 20 MG tablet TAKE ONE TABLET BY MOUTH DAILY     Allergies:   Prednisone   Past Medical History:  Diagnosis Date   Arthritis    knees, hips, hands   Complication of anesthesia    Woke up during hip replacement, had surgery since with no problems   Coronary artery disease    a. s/p DES to RCA in 2008;  b. 10/2015 Cath: LM nl, LAD 50ost/p, OM1/2 min irregs, RCA 33m ISR, 86m/d ISR, EF 65%.   Echocardiogram    Echo 11/16: EF 60-65, normal wall motion, grade 1 diastolic dysfunction   Grade I diastolic dysfunction 11/19/2015   ECHO   Hyperlipidemia    Iron deficiency anemia due to chronic blood loss 09/10/2015   Melanoma (HCC)    Nose   Pericarditis  a. 10/2015->colchicine.   Prostate cancer (HCC)    Ulcerative colitis with complication (HCC) 09/10/2015    Past Surgical History:  Procedure Laterality Date   ANTERIOR CERVICAL DECOMP/DISCECTOMY FUSION N/A 03/26/2020   Procedure: Cervical Four-Five, Cervical Five-Six, Cervical Six-Seven Anterior Cervical Decompression/Discectomy/Fusion;  Surgeon: Coletta Memos, MD;  Location[facility] OR;  Service: Neurosurgery;  Laterality: N/A;  3C   CARDIAC CATHETERIZATION N/A 11/18/2015   Procedure: Left Heart Cath and Coronary Angiography;  Surgeon: Tonny Bollman, MD;  Location: [facility] INVASIVE CV LAB;  Service: Cardiovascular;  Laterality: N/A;   CARPAL TUNNEL RELEASE Right 03/26/2020   Procedure: CARPAL TUNNEL RELEASE;  Surgeon: Coletta Memos, MD;  Location: [facility] OR;  Service: Neurosurgery;  Laterality: Right;   CERVICAL SPINE SURGERY     COLONOSCOPY  2019   CORONARY ANGIOPLASTY WITH STENT  PLACEMENT  2008   a. Cordis stent to RCA in 2008 2.9mm x 18mm   HIP ARTHROPLASTY Right    MOHS SURGERY     PARTIAL KNEE ARTHROPLASTY Left 11/30/2018   Procedure: LEFT UNICOMPARTMENTAL KNEE Medially;  Surgeon: Durene Romans, MD;  Location: [facility] ORS;  Service: Orthopedics;  Laterality: Left;  70 mins with block   RADIOACTIVE SEED IMPLANT N/A 08/10/2019   Procedure: RADIOACTIVE SEED IMPLANT/BRACHYTHERAPY IMPLANT;  Surgeon: Sebastian Ache, MD;  [facility];  Service: Urology;  Laterality: N/A;   SPACE OAR INSTILLATION N/A 08/10/2019   Procedure: SPACE OAR INSTILLATION;  Surgeon: Sebastian Ache, MD;  Location: [facility];  Service: Urology;  Laterality: N/A;   TONSILLECTOMY     childhood     Social History:  The patient  reports that he has never smoked. He has never used smokeless tobacco. He reports current alcohol use. He reports that he does not use drugs.   Family History:  The patient's family history includes Arthritis in his mother and sister; CAD in his father; Cancer in his mother; Heart attack in his father; Heart disease in his father and sister; Hypertension in his brother and mother; Lymphoma in his mother.    ROS:  Please see the history of present illness. All other systems are reviewed and  Negative to the above problem except as noted.    PHYSICAL EXAM: VS:  BP 124/78   Pulse 77   Ht 5\' 3"  (1.6 m)   Wt 85.4 kg   SpO2 95%   BMI 33.34 kg/m   GEN: Obese 68 yo  in no acute distress  HEENT: normal  Neck: JVP is not elevated   Cardiac: RRR; no murmur,   NO LE edema  Respiratory:  clear to auscultation GI: soft, nontender,   No hepatomegaly  MS: no deformity Moving all extremities   Skin: warm and dry, no rash Neuro:  Strength and sensation are intact Psych: euthymic mood, full affect   EKG:  EKG shows sinus rhythm 77 bpm  Mointor   Dec 2023  Predominant rhythm  Sinus rhythm   Rates 50 to 128 bpm  Average HR 75 bpm    Rare  PACs, PVCs.     Few short bursts of SVT, longest 19 beats, fastest for 8 beats (197 bpm)   Triggered events correlated with SR.   Lipid Panel    Component Value Date/Time   CHOL 116 07/18/2023 1546   CHOL 125 03/09/2022 1239   TRIG 111.0 07/18/2023 1546   HDL 50.40 07/18/2023 1546   HDL 53 03/09/2022 1239   CHOLHDL 2 07/18/2023 1546   VLDL  22.2 07/18/2023 1546   LDLCALC 44 07/18/2023 1546   LDLCALC 56 03/09/2022 1239   LDLCALC 106 (H) 10/13/2020 0908      Wt Readings from Last 3 Encounters:  03/19/24 85.4 kg  12/27/23 87.5 kg  08/24/23 84.4 kg      ASSESSMENT AND PLAN:   1.  History of CAD.  Intervention to RCA in 2008.  Patient has done well since patient without angina  2.   HTN  BP remains well controlled    3.   HL  LDL In July 2024 was 44, HDL 50, triglycerides 111  4.  Metabolic's: Patient's A1c in July 2024 was 5.8 discussed diet needs to cut back on carbs.  Recommend that he stay active         Current medicines are reviewed at length with the patient today.  The patient does not have concerns regarding medicines.  Signed, Dietrich Pates, MD  03/19/2024 11:09 PM    [facility] [address] [place], [place], [place]  [address] Phone: [phone]; Fax: [phone]

## 2024-03-19 ENCOUNTER — Encounter: Payer: Self-pay | Admitting: Internal Medicine

## 2024-03-19 ENCOUNTER — Ambulatory Visit: Payer: Medicare HMO | Attending: Internal Medicine | Admitting: Internal Medicine

## 2024-03-19 ENCOUNTER — Ambulatory Visit: Payer: Medicare HMO | Admitting: Internal Medicine

## 2024-03-19 VITALS — BP 124/78 | HR 77 | Ht 63.0 in | Wt 188.2 lb

## 2024-03-19 DIAGNOSIS — I499 Cardiac arrhythmia, unspecified: Secondary | ICD-10-CM | POA: Diagnosis not present

## 2024-03-19 DIAGNOSIS — E785 Hyperlipidemia, unspecified: Secondary | ICD-10-CM

## 2024-03-19 DIAGNOSIS — I2510 Atherosclerotic heart disease of native coronary artery without angina pectoris: Secondary | ICD-10-CM | POA: Diagnosis not present

## 2024-03-19 MED ORDER — ROSUVASTATIN CALCIUM 20 MG PO TABS: 20.0000 mg | ORAL_TABLET | Freq: Every day | ORAL | 3 refills | Status: AC

## 2024-03-19 NOTE — Patient Instructions (Signed)
 Medication Instructions:   STOP TAKING AMLODIPINE NOW  *If you need a refill on your cardiac medications before your next appointment, please call your pharmacy*    Follow-Up: At [facility], you and your health needs are our priority.  As part of our continuing mission to provide you with exceptional heart care, our providers are all part of one team.  This team includes your primary Cardiologist (physician) and Advanced Practice Providers or APPs (Physician Assistants and Nurse Practitioners) who all work together to provide you with the care you need, when you need it.  Your next appointment:   11 month(s) FEB 2026  Provider:   Dietrich Pates, MD           1st Floor: - Lobby - Registration  - Pharmacy  - Lab - Cafe  2nd Floor: - PV Lab - Diagnostic Testing (echo, CT, nuclear med)  3rd Floor: - Vacant  4th Floor: - TCTS (cardiothoracic surgery) - AFib Clinic - Structural Heart Clinic - Vascular Surgery  - Vascular Ultrasound  5th Floor: - HeartCare Cardiology (general and EP) - Clinical Pharmacy for coumadin, hypertension, lipid, weight-loss medications, and med management appointments    Valet parking services will be available as well.

## 2024-03-20 ENCOUNTER — Encounter: Payer: Self-pay | Admitting: Family Medicine

## 2024-03-21 NOTE — Telephone Encounter (Signed)
 Looks like this was ordered under a "Franco Nones MD"

## 2024-04-09 DIAGNOSIS — M5440 Lumbago with sciatica, unspecified side: Secondary | ICD-10-CM | POA: Diagnosis not present

## 2024-04-09 DIAGNOSIS — Z6832 Body mass index (BMI) 32.0-32.9, adult: Secondary | ICD-10-CM | POA: Diagnosis not present

## 2024-04-10 DIAGNOSIS — Z0100 Encounter for examination of eyes and vision without abnormal findings: Secondary | ICD-10-CM | POA: Diagnosis not present

## 2024-04-12 ENCOUNTER — Ambulatory Visit

## 2024-04-12 DIAGNOSIS — Z0000 Encounter for general adult medical examination without abnormal findings: Secondary | ICD-10-CM

## 2024-04-12 NOTE — Patient Instructions (Signed)
 Mr. Tyler Estrada , Thank you for taking time to come for your Medicare Wellness Visit. I appreciate your ongoing commitment to your health goals. Please review the following plan we discussed and let me know if I can assist you in the future.   Referrals/Orders/Follow-Ups/Clinician Recommendations: none  This is a list of the screening recommended for you and due dates:  Health Maintenance  Topic Date Due   Zoster (Shingles) Vaccine (1 of 2) Never done   COVID-19 Vaccine (6 - Moderna risk 2024-25 season) 02/21/2024   Flu Shot  07/20/2024   Medicare Annual Wellness Visit  04/12/2025   DTaP/Tdap/Td vaccine (2 - Td or Tdap) 09/02/2028   Colon Cancer Screening  12/05/2033   Pneumonia Vaccine  Completed   Hepatitis C Screening  Completed   HPV Vaccine  Aged Out   Meningitis B Vaccine  Aged Out    Advanced directives: (ACP Link)Information on Advanced Care Planning can be found at [name]  Secretary of [facility] Advance Health Care Directives Advance Health Care Directives. [email or web address]   Next Medicare Annual Wellness Visit scheduled for next year: Yes  insert Preventive Care attachment Insert FALL PREVENTION attachment if needed

## 2024-04-12 NOTE — Progress Notes (Signed)
 Subjective:   Tyler Estrada is a 68 y.o. who presents for a Medicare Wellness preventive visit.  Visit Complete: Virtual I connected with  Randal Bury on 04/12/24 by a video and audio enabled telemedicine application and verified that I am speaking with the correct person using two identifiers.  Patient Location: Home  Provider Location: Home Office  I discussed the limitations of evaluation and management by telemedicine. The patient expressed understanding and agreed to proceed.  Vital Signs: Because this visit was a virtual/telehealth visit, some criteria may be missing or patient reported. Any vitals not documented were not able to be obtained and vitals that have been documented are patient reported.    Persons Participating in Visit: Patient assisted by spouse.  AWV Questionnaire: No: Patient Medicare AWV questionnaire was not completed prior to this visit.  Cardiac Risk Factors include: advanced age (>27men, >41 women);hypertension;male gender     Objective:    Today's Vitals   04/12/24 1418  PainSc: 5    There is no height or weight on file to calculate BMI.     04/12/2024    2:29 PM 10/15/2022    9:56 AM 03/28/2020    4:15 PM 03/26/2020    8:05 AM 03/20/2020    8:34 AM 08/10/2019    5:55 AM 04/24/2019    1:47 PM  Advanced Directives  Does Patient Have a Medical Advance Directive? No No No No No No No  Would patient like information on creating a medical advance directive?    No - Patient declined Yes (MAU/Ambulatory/Procedural Areas - Information given) No - Patient declined No - Patient declined    Current Medications (verified) Outpatient Encounter Medications as of 04/12/2024  Medication Sig   aspirin  EC 81 MG tablet Take 81 mg by mouth at bedtime.    folic acid (FOLVITE) 1 MG tablet Take 1 mg by mouth daily.   furosemide  (LASIX ) 20 MG tablet TAKE 1 TABLET BY MOUTH DAILY AS NEEDED FOR SWELLING OF LEG   loratadine  (CLARITIN ) 10 MG tablet Take 10 mg by mouth  daily.   meloxicam  (MOBIC ) 7.5 MG tablet Take 1 tablet (7.5 mg total) by mouth daily. Use as needed for arthritis   mesalamine  (LIALDA ) 1.2 g EC tablet Take 1.2 g by mouth in the morning and at bedtime. Takes two twice daily   methotrexate  (RHEUMATREX) 2.5 MG tablet Take 6 tablets (15 mg total) by mouth once a week. Caution:Chemotherapy. Protect from light. (Patient taking differently: Take 15 mg by mouth once a week. Caution:Chemotherapy. Protect from light. Takes 8 tablets weekly on Wednesday)   montelukast  (SINGULAIR ) 10 MG tablet TAKE 1 TABLET BY MOUTH EVERY NIGHT AT BEDTIME   nitroGLYCERIN  (NITROSTAT ) 0.4 MG SL tablet Place 1 tablet (0.4 mg total) under the tongue every 5 (five) minutes as needed for chest pain.   rosuvastatin  (CRESTOR ) 20 MG tablet Take 1 tablet (20 mg total) by mouth daily.   tamsulosin  (FLOMAX ) 0.4 MG CAPS capsule Take 0.4 mg by mouth daily.   triamcinolone  ointment (KENALOG ) 0.5 % Apply 1 application topically 2 (two) times daily. Use as needed for skin rash   No facility-administered encounter medications on file as of 04/12/2024.    Allergies (verified) Prednisone    History: Past Medical History:  Diagnosis Date   Arthritis    knees, hips, hands   Complication of anesthesia    Woke up during hip replacement, had surgery since with no problems   Coronary artery disease    a.  s/p DES to RCA in 2008;  b. 10/2015 Cath: LM nl, LAD 50ost/p, OM1/2 min irregs, RCA 66m ISR, 34m/d ISR, EF 65%.   Echocardiogram    Echo 11/16: EF 60-65, normal wall motion, grade 1 diastolic dysfunction   Grade I diastolic dysfunction 11/19/2015   ECHO   Hyperlipidemia    Iron deficiency anemia due to chronic blood loss 09/10/2015   Melanoma (HCC)    Nose   Pericarditis    a. 10/2015->colchicine .   Prostate cancer (HCC)    Ulcerative colitis with complication (HCC) 09/10/2015   Past Surgical History:  Procedure Laterality Date   ANTERIOR CERVICAL DECOMP/DISCECTOMY FUSION N/A  03/26/2020   Procedure: Cervical Four-Five, Cervical Five-Six, Cervical Six-Seven Anterior Cervical Decompression/Discectomy/Fusion;  Surgeon: Audie Bleacher, MD;  Location: [facility] OR;  Service: Neurosurgery;  Laterality: N/A;  3C   CARDIAC CATHETERIZATION N/A 11/18/2015   Procedure: Left Heart Cath and Coronary Angiography;  Surgeon: Arnoldo Lapping, MD;  Location: [facility] INVASIVE CV LAB;  Service: Cardiovascular;  Laterality: N/A;   CARPAL TUNNEL RELEASE Right 03/26/2020   Procedure: CARPAL TUNNEL RELEASE;  Surgeon: Audie Bleacher, MD;  Location: [facility] OR;  Service: Neurosurgery;  Laterality: Right;   CERVICAL SPINE SURGERY     COLONOSCOPY  2019   CORONARY ANGIOPLASTY WITH STENT PLACEMENT  2008   a. Cordis stent to RCA in 2008 2.57mm x 18mm   HIP ARTHROPLASTY Right    MOHS SURGERY     PARTIAL KNEE ARTHROPLASTY Left 11/30/2018   Procedure: LEFT UNICOMPARTMENTAL KNEE Medially;  Surgeon: Claiborne Crew, MD;  Location: [facility] ORS;  Service: Orthopedics;  Laterality: Left;  70 mins with block   RADIOACTIVE SEED IMPLANT N/A 08/10/2019   Procedure: RADIOACTIVE SEED IMPLANT/BRACHYTHERAPY IMPLANT;  Surgeon: Osborn Blaze, MD;  Location[facility];  Service: Urology;  Laterality: N/A;   SPACE OAR INSTILLATION N/A 08/10/2019   Procedure: SPACE OAR INSTILLATION;  Surgeon: Osborn Blaze, MD;  Location: [facility];  Service: Urology;  Laterality: N/A;   TONSILLECTOMY     childhood   Family History  Problem Relation Age of Onset   Lymphoma Mother    Hypertension Mother    Arthritis Mother    Cancer Mother    CAD Father    Heart disease Father    Heart attack Father    Arthritis Sister    Heart disease Sister    Hypertension Brother    Social History   Socioeconomic History   Marital status: Married    Spouse name: Not on file   Number of children: 0   Years of education: Not on file   Highest education level: Some college, no degree  Occupational History    Comment: full  time  Tobacco Use   Smoking status: Never   Smokeless tobacco: Never  Vaping Use   Vaping status: Never Used  Substance and Sexual Activity   Alcohol use: Yes    Alcohol/week: 0.0 standard drinks of alcohol    Comment: 1-2 drinks per week.   Drug use: No   Sexual activity: Not on file  Other Topics Concern   Not on file  Social History Narrative   Not on file   Social Drivers of Health   Financial Resource Strain: Low Risk  (04/12/2024)   Overall Financial Resource Strain (CARDIA)    Difficulty of Paying Living Expenses: Not hard at all  Food Insecurity: No Food Insecurity (04/12/2024)   Hunger Vital Sign    Worried About Running Out  of Food in the Last Year: Never true    Ran Out of Food in the Last Year: Never true  Transportation Needs: No Transportation Needs (04/12/2024)   PRAPARE - Administrator, Civil Service (Medical): No    Lack of Transportation (Non-Medical): No  Physical Activity: Sufficiently Active (04/12/2024)   Exercise Vital Sign    Days of Exercise per Week: 3 days    Minutes of Exercise per Session: 60 min  Stress: No Stress Concern Present (04/12/2024)   [employer] of Occupational Health - Occupational Stress Questionnaire    Feeling of Stress : Not at all  Social Connections: Moderately Integrated (04/12/2024)   Social Connection and Isolation Panel [NHANES]    Frequency of Communication with Friends and Family: More than three times a week    Frequency of Social Gatherings with Friends and Family: Once a week    Attends Religious Services: Never    Database administrator or Organizations: Yes    Attends Engineer, structural: More than 4 times per year    Marital Status: Married    Tobacco Counseling Counseling given: Not Answered    Clinical Intake:  Pre-visit preparation completed: Yes  Pain : 0-10 Pain Score: 5  Pain Type: Chronic pain Pain Location: Back Pain Orientation: Lower Pain Descriptors / Indicators:  Aching Pain Onset: More than a month ago Pain Frequency: Constant     Nutritional Risks: None Diabetes: No  Lab Results  Component Value Date   HGBA1C 5.8 07/18/2023   HGBA1C 5.9 09/23/2021   HGBA1C 5.7 (H) 10/13/2020     How often do you need to have someone help you when you read instructions, pamphlets, or other written materials from your doctor or pharmacy?: 1 - Never  Interpreter Needed?: No  Information entered by :: NAllen LPN   Activities of Daily Living     04/12/2024    2:20 PM  In your present state of health, do you have any difficulty performing the following activities:  Hearing? 0  Vision? 0  Difficulty concentrating or making decisions? 0  Walking or climbing stairs? 0  Dressing or bathing? 0  Doing errands, shopping? 0  Preparing Food and eating ? N  Using the Toilet? N  In the past six months, have you accidently leaked urine? N  Do you have problems with loss of bowel control? N  Managing your Medications? Y  Comment wife sets up pill box  Managing your Finances? N  Housekeeping or managing your Housekeeping? N    Patient Care Team: Copland, Skipper Dumas, MD as PCP - General (Family Medicine) Elmyra Haggard, MD as PCP - Cardiology (Cardiology) Judyth Nunnery, RN Nurse Navigator as Registered Nurse (Medical Oncology) Audie Bleacher, MD as Consulting Physician (Neurosurgery) Garfield Jungling (Optometry) Secundino Dach Harvey Linen., MD as Consulting Physician (Urology) Stefan Edge, MD as Consulting Physician (Rheumatology) Ozell Blunt, MD as Consulting Physician (Gastroenterology) Claiborne Crew, MD as Consulting Physician (Orthopedic Surgery)  Indicate any recent Medical Services you may have received from other than [facility] providers in the past year (date may be approximate).     Assessment:   This is a routine wellness examination for Tyler Estrada.  Hearing/Vision screen Hearing Screening - Comments:: Denies hearing issues Vision Screening - Comments::  Regular eye exams, Dr. Alto Atta   Goals Addressed             This Visit's Progress    Patient Stated  04/12/2024, wants to lose weight       Depression Screen     04/12/2024    2:35 PM 09/08/2022   11:46 AM 01/02/2020   12:45 PM  PHQ 2/9 Scores  PHQ - 2 Score 0 0 0  PHQ- 9 Score 0  0    Fall Risk     04/12/2024    2:34 PM 09/08/2022   11:46 AM  Fall Risk   Falls in the past year? 0 0  Number falls in past yr: 0 0  Injury with Fall? 0 0  Risk for fall due to : Medication side effect No Fall Risks  Follow up Falls prevention discussed;Falls evaluation completed Falls evaluation completed    MEDICARE RISK AT HOME:  Medicare Risk at Home Any stairs in or around the home?: No If so, are there any without handrails?: No Home free of loose throw rugs in walkways, pet beds, electrical cords, etc?: Yes Adequate lighting in your home to reduce risk of falls?: Yes Life alert?: No Use of a cane, walker or w/c?: No Grab bars in the bathroom?: No Shower chair or bench in shower?: Yes Elevated toilet seat or a handicapped toilet?: Yes  TIMED UP AND GO:  Was the test performed?  No  Cognitive Function: 6CIT completed        04/12/2024    2:36 PM  6CIT Screen  What Year? 0 points  What month? 0 points  What time? 0 points  Count back from 20 0 points  Months in reverse 4 points  Repeat phrase 0 points  Total Score 4 points    Immunizations Immunization History  Administered Date(s) Administered   Fluad Quad(high Dose 65+) 10/05/2022, 08/24/2023   Influenza Inj Mdck Quad Pf 09/22/2019   Influenza,inj,Quad PF,6+ Mos 09/02/2018, 09/23/2021   Influenza,inj,quad, With Preservative 09/02/2018   Influenza-Unspecified 09/02/2018   Moderna Covid-19 Vaccine Bivalent Booster 64yrs & up 10/28/2021   Moderna SARS-COV2 Booster Vaccination 11/07/2020, 03/27/2021   Moderna Sars-Covid-2 Vaccination 02/19/2020, 03/23/2020   PNEUMOCOCCAL CONJUGATE-20 12/27/2022    Pfizer(Comirnaty )Fall Seasonal Vaccine 12 years and older 10/05/2022   Tdap 09/02/2018   Unspecified SARS-COV-2 Vaccination 08/24/2023    Screening Tests Health Maintenance  Topic Date Due   Zoster Vaccines- Shingrix (1 of 2) Never done   COVID-19 Vaccine (6 - Moderna risk 2024-25 season) 02/21/2024   INFLUENZA VACCINE  07/20/2024   Medicare Annual Wellness (AWV)  04/12/2025   DTaP/Tdap/Td (2 - Td or Tdap) 09/02/2028   Colonoscopy  12/05/2033   Pneumonia Vaccine 87+ Years old  Completed   Hepatitis C Screening  Completed   HPV VACCINES  Aged Out   Meningococcal B Vaccine  Aged Out    Health Maintenance  Health Maintenance Due  Topic Date Due   Zoster Vaccines- Shingrix (1 of 2) Never done   COVID-19 Vaccine (6 - Moderna risk 2024-25 season) 02/21/2024   Health Maintenance Items Addressed: Due for Shingrix.  Additional Screening:  Vision Screening: Recommended annual ophthalmology exams for early detection of glaucoma and other disorders of the eye.  Dental Screening: Recommended annual dental exams for proper oral hygiene  Community Resource Referral / Chronic Care Management: CRR required this visit?  No   CCM required this visit?  No     Plan:     I have personally reviewed and noted the following in the patient's chart:   Medical and social history Use of alcohol, tobacco or illicit drugs  Current medications and supplements  including opioid prescriptions. Patient is not currently taking opioid prescriptions. Functional ability and status Nutritional status Physical activity Advanced directives List of other physicians Hospitalizations, surgeries, and ER visits in previous 12 months Vitals Screenings to include cognitive, depression, and falls Referrals and appointments  In addition, I have reviewed and discussed with patient certain preventive protocols, quality metrics, and best practice recommendations. A written personalized care plan for preventive  services as well as general preventive health recommendations were provided to patient.     Areatha Beecham, LPN   1/61/0960   After Visit Summary: (MyChart) Due to this being a telephonic visit, the after visit summary with patients personalized plan was offered to patient via MyChart   Notes: Nothing significant to report at this time.

## 2024-04-18 DIAGNOSIS — M5451 Vertebrogenic low back pain: Secondary | ICD-10-CM | POA: Diagnosis not present

## 2024-04-22 ENCOUNTER — Other Ambulatory Visit: Payer: Self-pay | Admitting: Internal Medicine

## 2024-05-01 DIAGNOSIS — M5451 Vertebrogenic low back pain: Secondary | ICD-10-CM | POA: Diagnosis not present

## 2024-05-01 NOTE — Progress Notes (Unsigned)
 [facility] [address] [place], [place] [address] [phone]  Date:  05/02/2024   Name:  Tyler Estrada   DOB:  [birth year]   MRN:  [id number]  PCP:  Kaylee Partridge, MD    Chief Complaint: No chief complaint on file.   History of Present Illness:  Omeir Zender is a 68 y.o. very pleasant male patient who presents with the following:  Patient seen today with concern of a bump on his face Most recent visit with myself was in September History of hypertension, CAD with RCA stent 2008, prediabetes, hyperlipidemia, sleep apnea treated with oral appliance, vitamin D deficiency. He underwent a C4-7 decompression in April of 2022 and has done overall quite well from that standpoint He also has rheumatoid arthritis using methotrexate  per rheumatology Can update lab work today if he would like  Lab Results  Component Value Date   HGBA1C 5.8 07/18/2023     Patient Active Problem List   Diagnosis Date Noted   Drug-induced gynecomastia 10/15/2022   Pre-op evaluation 12/01/2020   Essential hypertension 12/01/2020   Cervical spondylosis with myelopathy and radiculopathy 03/26/2020   Prostate cancer (HCC) 04/12/2019   Obese 12/01/2018   S/P left UKR 11/30/2018   H/O pericarditis    Elevated troponin 11/19/2015   Dyslipidemia, goal LDL below 70    CAD S/P percutaneous coronary angioplasty 11/18/2015   Chest pain 11/18/2015   Iron deficiency anemia due to chronic blood loss 09/10/2015   Ulcerative colitis with complication (HCC) 09/10/2015    Past Medical History:  Diagnosis Date   Arthritis    knees, hips, hands   Complication of anesthesia    Woke up during hip replacement, had surgery since with no problems   Coronary artery disease    a. s/p DES to RCA in 2008;  b. 10/2015 Cath: LM nl, LAD 50ost/p, OM1/2 min irregs, RCA 28m ISR, 42m/d ISR, EF 65%.   Echocardiogram    Echo 11/16: EF 60-65, normal wall  motion, grade 1 diastolic dysfunction   Grade I diastolic dysfunction 11/19/2015   ECHO   Hyperlipidemia    Iron deficiency anemia due to chronic blood loss 09/10/2015   Melanoma (HCC)    Nose   Pericarditis    a. 10/2015->colchicine .   Prostate cancer (HCC)    Ulcerative colitis with complication (HCC) 09/10/2015    Past Surgical History:  Procedure Laterality Date   ANTERIOR CERVICAL DECOMP/DISCECTOMY FUSION N/A 03/26/2020   Procedure: Cervical Four-Five, Cervical Five-Six, Cervical Six-Seven Anterior Cervical Decompression/Discectomy/Fusion;  Surgeon: Audie Bleacher, MD;  Location: [facility] OR;  Service: Neurosurgery;  Laterality: N/A;  3C   CARDIAC CATHETERIZATION N/A 11/18/2015   Procedure: Left Heart Cath and Coronary Angiography;  Surgeon: Arnoldo Lapping, MD;  [facility] INVASIVE CV LAB;  Service: Cardiovascular;  Laterality: N/A;   CARPAL TUNNEL RELEASE Right 03/26/2020   Procedure: CARPAL TUNNEL RELEASE;  Surgeon: Audie Bleacher, MD;  Location: [facility] OR;  Service: Neurosurgery;  Laterality: Right;   CERVICAL SPINE SURGERY     COLONOSCOPY  2019   CORONARY ANGIOPLASTY WITH STENT PLACEMENT  2008   a. Cordis stent to RCA in 2008 2.66mm x 18mm   HIP ARTHROPLASTY Right    MOHS SURGERY     PARTIAL KNEE ARTHROPLASTY Left 11/30/2018   Procedure: LEFT UNICOMPARTMENTAL KNEE Medially;  Surgeon: Claiborne Crew, MD;  Location: [facility] ORS;  Service: Orthopedics;  Laterality: Left;  70 mins with block  RADIOACTIVE SEED IMPLANT N/A 08/10/2019   Procedure: RADIOACTIVE SEED IMPLANT/BRACHYTHERAPY IMPLANT;  Surgeon: Osborn Blaze, MD;  Location: [facility];  Service: Urology;  Laterality: N/A;   SPACE OAR INSTILLATION N/A 08/10/2019   Procedure: SPACE OAR INSTILLATION;  Surgeon: Osborn Blaze, MD;  Location: [facility];  Service: Urology;  Laterality: N/A;   TONSILLECTOMY     childhood    Social History   Tobacco Use   Smoking status: Never   Smokeless tobacco: Never   Vaping Use   Vaping status: Never Used  Substance Use Topics   Alcohol use: Yes    Alcohol/week: 0.0 standard drinks of alcohol    Comment: 1-2 drinks per week.   Drug use: No    Family History  Problem Relation Age of Onset   Lymphoma Mother    Hypertension Mother    Arthritis Mother    Cancer Mother    CAD Father    Heart disease Father    Heart attack Father    Arthritis Sister    Heart disease Sister    Hypertension Brother     Allergies  Allergen Reactions   Prednisone      Blurred vision     Medication list has been reviewed and updated.  Current Outpatient Medications on File Prior to Visit  Medication Sig Dispense Refill   aspirin  EC 81 MG tablet Take 81 mg by mouth at bedtime.      folic acid (FOLVITE) 1 MG tablet Take 1 mg by mouth daily.     furosemide  (LASIX ) 20 MG tablet TAKE 1 TABLET BY MOUTH DAILY AS NEEDED FOR SWELLING OF LEG 90 tablet 0   loratadine  (CLARITIN ) 10 MG tablet Take 10 mg by mouth daily.     meloxicam  (MOBIC ) 7.5 MG tablet Take 1 tablet (7.5 mg total) by mouth daily. Use as needed for arthritis 30 tablet 6   mesalamine  (LIALDA ) 1.2 g EC tablet Take 1.2 g by mouth in the morning and at bedtime. Takes two twice daily     methotrexate  (RHEUMATREX) 2.5 MG tablet Take 6 tablets (15 mg total) by mouth once a week. Caution:Chemotherapy. Protect from light. (Patient taking differently: Take 15 mg by mouth once a week. Caution:Chemotherapy. Protect from light. Takes 8 tablets weekly on Wednesday) 20 tablet 0   montelukast  (SINGULAIR ) 10 MG tablet TAKE 1 TABLET BY MOUTH EVERY NIGHT AT BEDTIME 90 tablet 3   nitroGLYCERIN  (NITROSTAT ) 0.4 MG SL tablet Place 1 tablet (0.4 mg total) under the tongue every 5 (five) minutes as needed for chest pain. 25 tablet 3   rosuvastatin  (CRESTOR ) 20 MG tablet Take 1 tablet (20 mg total) by mouth daily. 90 tablet 3   tamsulosin  (FLOMAX ) 0.4 MG CAPS capsule Take 0.4 mg by mouth daily.     triamcinolone  ointment (KENALOG )  0.5 % Apply 1 application topically 2 (two) times daily. Use as needed for skin rash 60 g 1   No current facility-administered medications on file prior to visit.    Review of Systems:  As per HPI- otherwise negative.   Physical Examination: There were no vitals filed for this visit. There were no vitals filed for this visit. There is no height or weight on file to calculate BMI. Ideal Body Weight:    GEN: no acute distress. HEENT: Atraumatic, Normocephalic.  Ears and Nose: No external deformity. CV: RRR, No M/G/R. No JVD. No thrill. No extra heart sounds. PULM: CTA B, no wheezes, crackles, rhonchi. No retractions.  No resp. distress. No accessory muscle use. ABD: S, NT, ND, +BS. No rebound. No HSM. EXTR: No c/c/e PSYCH: Normally interactive. Conversant.    Assessment and Plan: ***  Signed Gates Kasal, MD

## 2024-05-02 ENCOUNTER — Encounter: Payer: Self-pay | Admitting: Family Medicine

## 2024-05-02 ENCOUNTER — Ambulatory Visit (INDEPENDENT_AMBULATORY_CARE_PROVIDER_SITE_OTHER): Admitting: Family Medicine

## 2024-05-02 VITALS — BP 136/82 | HR 91 | Ht 63.0 in | Wt 190.0 lb

## 2024-05-02 DIAGNOSIS — R7303 Prediabetes: Secondary | ICD-10-CM | POA: Diagnosis not present

## 2024-05-02 DIAGNOSIS — E785 Hyperlipidemia, unspecified: Secondary | ICD-10-CM | POA: Diagnosis not present

## 2024-05-02 DIAGNOSIS — I10 Essential (primary) hypertension: Secondary | ICD-10-CM | POA: Diagnosis not present

## 2024-05-02 DIAGNOSIS — C61 Malignant neoplasm of prostate: Secondary | ICD-10-CM

## 2024-05-02 DIAGNOSIS — D649 Anemia, unspecified: Secondary | ICD-10-CM

## 2024-05-02 DIAGNOSIS — L03211 Cellulitis of face: Secondary | ICD-10-CM

## 2024-05-02 LAB — CBC
HCT: 35 % — ABNORMAL LOW (ref 39.0–52.0)
Hemoglobin: 11.6 g/dL — ABNORMAL LOW (ref 13.0–17.0)
MCHC: 33.1 g/dL (ref 30.0–36.0)
MCV: 91.9 fl (ref 78.0–100.0)
Platelets: 213 10*3/uL (ref 150.0–400.0)
RBC: 3.81 Mil/uL — ABNORMAL LOW (ref 4.22–5.81)
RDW: 14.8 % (ref 11.5–15.5)
WBC: 3.6 10*3/uL — ABNORMAL LOW (ref 4.0–10.5)

## 2024-05-02 LAB — COMPREHENSIVE METABOLIC PANEL WITH GFR
ALT: 15 U/L (ref 0–53)
AST: 18 U/L (ref 0–37)
Albumin: 4.2 g/dL (ref 3.5–5.2)
Alkaline Phosphatase: 69 U/L (ref 39–117)
BUN: 17 mg/dL (ref 6–23)
CO2: 25 meq/L (ref 19–32)
Calcium: 8.9 mg/dL (ref 8.4–10.5)
Chloride: 108 meq/L (ref 96–112)
Creatinine, Ser: 0.67 mg/dL (ref 0.40–1.50)
GFR: 96.69 mL/min (ref >60.00)
Glucose, Bld: 91 mg/dL (ref 70–99)
Potassium: 3.9 meq/L (ref 3.5–5.1)
Sodium: 141 meq/L (ref 135–145)
Total Bilirubin: 0.5 mg/dL (ref 0.2–1.2)
Total Protein: 6.1 g/dL (ref 6.0–8.3)

## 2024-05-02 LAB — PSA: PSA: 0 ng/mL — ABNORMAL LOW (ref 0.10–4.00)

## 2024-05-02 LAB — LIPID PANEL
Cholesterol: 124 mg/dL (ref 0–200)
HDL: 51.5 mg/dL (ref >39.00)
LDL Cholesterol: 54 mg/dL (ref 0–99)
NonHDL: 72.87
Total CHOL/HDL Ratio: 2
Triglycerides: 92 mg/dL (ref 0.0–149.0)
VLDL: 18.4 mg/dL (ref 0.0–40.0)

## 2024-05-02 LAB — HEMOGLOBIN A1C: Hgb A1c MFr Bld: 5.9 % (ref 4.6–6.5)

## 2024-05-02 MED ORDER — CEPHALEXIN 500 MG PO CAPS
500.0000 mg | ORAL_CAPSULE | Freq: Three times a day (TID) | ORAL | 0 refills | Status: AC
Start: 2024-05-02 — End: ?

## 2024-05-02 NOTE — Patient Instructions (Signed)
 It was good to see you! I will be in touch with your labs I am not quite sure about the bump on your face- most likely it is a pimple or small cyst of some sort Right now there is not a lot to see Fill the keflex rx and take it with you on your trip- you can start it now or wait and see how things go if you like Warm compresses also may help Keep me posted about this Recommend shingles vaccine series at your convenience

## 2024-05-15 DIAGNOSIS — M5451 Vertebrogenic low back pain: Secondary | ICD-10-CM | POA: Diagnosis not present

## 2024-05-17 DIAGNOSIS — M5451 Vertebrogenic low back pain: Secondary | ICD-10-CM | POA: Diagnosis not present

## 2024-05-22 ENCOUNTER — Inpatient Hospital Stay: Attending: Medical Oncology

## 2024-05-22 ENCOUNTER — Inpatient Hospital Stay: Admitting: Medical Oncology

## 2024-05-22 ENCOUNTER — Encounter: Payer: Self-pay | Admitting: Medical Oncology

## 2024-05-22 VITALS — BP 143/64 | HR 73 | Temp 97.9°F | Resp 18 | Ht 62.0 in | Wt 186.8 lb

## 2024-05-22 DIAGNOSIS — E538 Deficiency of other specified B group vitamins: Secondary | ICD-10-CM

## 2024-05-22 DIAGNOSIS — D649 Anemia, unspecified: Secondary | ICD-10-CM

## 2024-05-22 DIAGNOSIS — Z7982 Long term (current) use of aspirin: Secondary | ICD-10-CM | POA: Insufficient documentation

## 2024-05-22 DIAGNOSIS — Z85820 Personal history of malignant melanoma of skin: Secondary | ICD-10-CM | POA: Diagnosis not present

## 2024-05-22 DIAGNOSIS — M069 Rheumatoid arthritis, unspecified: Secondary | ICD-10-CM | POA: Diagnosis not present

## 2024-05-22 DIAGNOSIS — Z8546 Personal history of malignant neoplasm of prostate: Secondary | ICD-10-CM | POA: Diagnosis not present

## 2024-05-22 DIAGNOSIS — Z79631 Long term (current) use of antimetabolite agent: Secondary | ICD-10-CM | POA: Insufficient documentation

## 2024-05-22 DIAGNOSIS — Z79899 Other long term (current) drug therapy: Secondary | ICD-10-CM | POA: Diagnosis not present

## 2024-05-22 LAB — CBC WITH DIFFERENTIAL (CANCER CENTER ONLY)
Abs Immature Granulocytes: 0.02 10*3/uL (ref 0.00–0.07)
Basophils Absolute: 0 10*3/uL (ref 0.0–0.1)
Basophils Relative: 1 %
Eosinophils Absolute: 0.2 10*3/uL (ref 0.0–0.5)
Eosinophils Relative: 3 %
HCT: 35 % — ABNORMAL LOW (ref 39.0–52.0)
Hemoglobin: 11.7 g/dL — ABNORMAL LOW (ref 13.0–17.0)
Immature Granulocytes: 0 %
Lymphocytes Relative: 15 %
Lymphs Abs: 0.7 10*3/uL (ref 0.7–4.0)
MCH: 30 pg (ref 26.0–34.0)
MCHC: 33.4 g/dL (ref 30.0–36.0)
MCV: 89.7 fL (ref 80.0–100.0)
Monocytes Absolute: 0.6 10*3/uL (ref 0.1–1.0)
Monocytes Relative: 13 %
Neutro Abs: 3.2 10*3/uL (ref 1.7–7.7)
Neutrophils Relative %: 68 %
Platelet Count: 206 10*3/uL (ref 150–400)
RBC: 3.9 MIL/uL — ABNORMAL LOW (ref 4.22–5.81)
RDW: 14 % (ref 11.5–15.5)
WBC Count: 4.7 10*3/uL (ref 4.0–10.5)
nRBC: 0 % (ref 0.0–0.2)

## 2024-05-22 LAB — CMP (CANCER CENTER ONLY)
ALT: 17 U/L (ref 0–44)
AST: 23 U/L (ref 15–41)
Albumin: 4.4 g/dL (ref 3.5–5.0)
Alkaline Phosphatase: 73 U/L (ref 38–126)
Anion gap: 14 (ref 5–15)
BUN: 14 mg/dL (ref 8–23)
CO2: 21 mmol/L — ABNORMAL LOW (ref 22–32)
Calcium: 9.1 mg/dL (ref 8.9–10.3)
Chloride: 107 mmol/L (ref 98–111)
Creatinine: 0.61 mg/dL (ref 0.61–1.24)
GFR, Estimated: >60 mL/min (ref >60)
Glucose, Bld: 109 mg/dL — ABNORMAL HIGH (ref 70–99)
Potassium: 3.8 mmol/L (ref 3.5–5.1)
Sodium: 141 mmol/L (ref 135–145)
Total Bilirubin: 0.5 mg/dL (ref 0.0–1.2)
Total Protein: 6.8 g/dL (ref 6.5–8.1)

## 2024-05-22 LAB — FOLATE: Folate: >40 ng/mL (ref >5.9)

## 2024-05-22 LAB — RETIC PANEL
Immature Retic Fract: 21.1 % — ABNORMAL HIGH (ref 2.3–15.9)
RBC.: 3.9 MIL/uL — ABNORMAL LOW (ref 4.22–5.81)
Retic Count, Absolute: 60.5 10*3/uL (ref 19.0–186.0)
Retic Ct Pct: 1.6 % (ref 0.4–3.1)
Reticulocyte Hemoglobin: 35.7 pg (ref >27.9)

## 2024-05-22 LAB — FERRITIN: Ferritin: 159 ng/mL (ref 24–336)

## 2024-05-22 LAB — VITAMIN B12: Vitamin B-12: 209 pg/mL (ref 180–914)

## 2024-05-22 NOTE — Progress Notes (Unsigned)
 [facility] [phone]   [phone]  INITIAL CONSULT NOTE  Patient Care Team: Copland, Skipper Dumas, MD as PCP - General (Family Medicine) Elmyra Haggard, MD as PCP - Cardiology (Cardiology) Judyth Nunnery, RN Nurse Navigator as Registered Nurse (Medical Oncology) Audie Bleacher, MD as Consulting Physician (Neurosurgery) Garfield Jungling (Optometry) Secundino Dach, Harvey Linen., MD as Consulting Physician (Urology) Stefan Edge, MD as Consulting Physician (Rheumatology) Ozell Blunt, MD as Consulting Physician (Gastroenterology) Claiborne Crew, MD as Consulting Physician (Orthopedic Surgery)  CHIEF COMPLAINTS/PURPOSE OF CONSULTATION:  Mild Chronic Anemia  HISTORY OF PRESENTING ILLNESS:  Tyler Estrada 68 y.o. male is referred to our office by their PCP Dr. Geralyn Knee for mild chronic anemia:  He is here with his wife. She was a Nurse, learning disability. He is retired from Education officer, environmental.   He reports that he is doing well. The reason for his referral is that he has had mild chronic anemia for many years. Attributed to his UC. His UC has been well controlled for the past 1-2 years but anemia remains.   He is on methotrexate  for rheumatoid arthritis he has been on this medication for about 1 year.  He also has had UC since he was a teenager. He has been on Mesalamine  for many many years.   He was diagnosed with prostate cancer about 3-4 years ago. He had localized XRT, prostate seed implantation, ADT. No chemotherapy history. He sees Mr. Secundino Dach [facility]-for follow up every 6-12 months. He also sees Dr. Lorri Rota for rad onc follow up.   Blood in stools:No Change of bowel movement/constipation: No Family history of GI cancers: No Last Colonoscopy: 2024 - normal Last Endoscopy: Many years ago- normal History of GERD or GI bleed:Yes UC UC/Crohn's: Yes PPI use: No Hiatal Hernia: No NSAID use: No Blood in urine: No Difficulty swallowing: No Pica: No SOB: No Fatigue:  No Prior blood transfusion: No Prior history of blood loss: No Liver disease: No Kidney Disease: No Bariatric/Intestinal surgery: No Frequent Blood Donation: No Prior evaluation with hematology: No Prior bone marrow biopsy: No Oral iron: Not currently He is taking folic acid Prior IV iron infusions: No Family history Anemia: No known sickle cell or thalassemia  No Personal or family history of bleeding or clotting disorders.  Special diets: No No use of GLP-1: No Iron rich foods in diet: Some Eating habits: Mixed   MEDICAL HISTORY:  Past Medical History:  Diagnosis Date   Arthritis    knees, hips, hands   Complication of anesthesia    Woke up during hip replacement, had surgery since with no problems   Coronary artery disease    a. s/p DES to RCA in 2008;  b. 10/2015 Cath: LM nl, LAD 50ost/p, OM1/2 min irregs, RCA 29m ISR, 58m/d ISR, EF 65%.   Echocardiogram    Echo 11/16: EF 60-65, normal wall motion, grade 1 diastolic dysfunction   Grade I diastolic dysfunction 11/19/2015   ECHO   Hyperlipidemia    Iron deficiency anemia due to chronic blood loss 09/10/2015   Melanoma (HCC)    Nose   Pericarditis    a. 10/2015->colchicine .   Prostate cancer (HCC)    Ulcerative colitis with complication (HCC) 09/10/2015    SURGICAL HISTORY: Past Surgical History:  Procedure Laterality Date   ANTERIOR CERVICAL DECOMP/DISCECTOMY FUSION N/A 03/26/2020   Procedure: Cervical Four-Five, Cervical Five-Six, Cervical Six-Seven Anterior Cervical Decompression/Discectomy/Fusion;  Surgeon: Audie Bleacher, MD;  Location: [facility] OR;  Service: Neurosurgery;  Laterality: N/A;  3C   CARDIAC CATHETERIZATION N/A 11/18/2015   Procedure: Left Heart Cath and Coronary Angiography;  Surgeon: Arnoldo Lapping, MD;  Location: [facility] INVASIVE CV LAB;  Service: Cardiovascular;  Laterality: N/A;   CARPAL TUNNEL RELEASE Right 03/26/2020   Procedure: CARPAL TUNNEL RELEASE;  Surgeon: Audie Bleacher, MD;  Location: [facility] OR;  Service:  Neurosurgery;  Laterality: Right;   CERVICAL SPINE SURGERY     COLONOSCOPY  2019   CORONARY ANGIOPLASTY WITH STENT PLACEMENT  2008   a. Cordis stent to RCA in 2008 2.10mm x 18mm   HIP ARTHROPLASTY Right    MOHS SURGERY     PARTIAL KNEE ARTHROPLASTY Left 11/30/2018   Procedure: LEFT UNICOMPARTMENTAL KNEE Medially;  Surgeon: Claiborne Crew, MD;  Location: [facility] ORS;  Service: Orthopedics;  Laterality: Left;  70 mins with block   RADIOACTIVE SEED IMPLANT N/A 08/10/2019   Procedure: RADIOACTIVE SEED IMPLANT/BRACHYTHERAPY IMPLANT;  Surgeon: Osborn Blaze, MD;  Location: [facility];  Service: Urology;  Laterality: N/A;   SPACE OAR INSTILLATION N/A 08/10/2019   Procedure: SPACE OAR INSTILLATION;  Surgeon: Osborn Blaze, MD;  Location: [facility];  Service: Urology;  Laterality: N/A;   TONSILLECTOMY     childhood    SOCIAL HISTORY: Social History   Socioeconomic History   Marital status: Married    Spouse name: Not on file   Number of children: 0   Years of education: Not on file   Highest education level: Some college, no degree  Occupational History    Comment: full time  Tobacco Use   Smoking status: Never   Smokeless tobacco: Never  Vaping Use   Vaping status: Never Used  Substance and Sexual Activity   Alcohol use: Yes    Alcohol/week: 0.0 standard drinks of alcohol    Comment: 1-2 drinks per week.   Drug use: No   Sexual activity: Not on file  Other Topics Concern   Not on file  Social History Narrative   Not on file   Social Drivers of Health   Financial Resource Strain: Low Risk  (04/12/2024)   Overall Financial Resource Strain (CARDIA)    Difficulty of Paying Living Expenses: Not hard at all  Food Insecurity: No Food Insecurity (04/12/2024)   Hunger Vital Sign    Worried About Running Out of Food in the Last Year: Never true    Ran Out of Food in the Last Year: Never true  Transportation Needs: No Transportation Needs (04/12/2024)    PRAPARE - Administrator, Civil Service (Medical): No    Lack of Transportation (Non-Medical): No  Physical Activity: Sufficiently Active (04/12/2024)   Exercise Vital Sign    Days of Exercise per Week: 3 days    Minutes of Exercise per Session: 60 min  Stress: No Stress Concern Present (04/12/2024)   [employer] of Occupational Health - Occupational Stress Questionnaire    Feeling of Stress : Not at all  Social Connections: Moderately Integrated (04/12/2024)   Social Connection and Isolation Panel [NHANES]    Frequency of Communication with Friends and Family: More than three times a week    Frequency of Social Gatherings with Friends and Family: Once a week    Attends Religious Services: Never    Database administrator or Organizations: Yes    Attends Banker Meetings: More than 4 times per year    Marital Status: Married  Catering manager Violence: Not At Risk (04/12/2024)  Humiliation, Afraid, Rape, and Kick questionnaire    Fear of Current or Ex-Partner: No    Emotionally Abused: No    Physically Abused: No    Sexually Abused: No    FAMILY HISTORY: Family History  Problem Relation Age of Onset   Lymphoma Mother    Hypertension Mother    Arthritis Mother    Cancer Mother    CAD Father    Heart disease Father    Heart attack Father    Arthritis Sister    Heart disease Sister    Hypertension Brother     ALLERGIES:  is allergic to prednisone .  MEDICATIONS:  Current Outpatient Medications  Medication Sig Dispense Refill   aspirin  EC 81 MG tablet Take 81 mg by mouth at bedtime.      cephALEXin  (KEFLEX ) 500 MG capsule Take 1 capsule (500 mg total) by mouth 3 (three) times daily. 15 capsule 0   folic acid (FOLVITE) 1 MG tablet Take 1 mg by mouth daily.     loratadine  (CLARITIN ) 10 MG tablet Take 10 mg by mouth daily.     meloxicam  (MOBIC ) 7.5 MG tablet Take 1 tablet (7.5 mg total) by mouth daily. Use as needed for arthritis 30 tablet 6    mesalamine  (LIALDA ) 1.2 g EC tablet Take 1.2 g by mouth in the morning and at bedtime. Takes two twice daily     methotrexate  (RHEUMATREX) 2.5 MG tablet Take 6 tablets (15 mg total) by mouth once a week. Caution:Chemotherapy. Protect from light. (Patient taking differently: Take 15 mg by mouth once a week. Caution:Chemotherapy. Protect from light. Takes 8 tablets weekly on Wednesday) 20 tablet 0   montelukast  (SINGULAIR ) 10 MG tablet TAKE 1 TABLET BY MOUTH EVERY NIGHT AT BEDTIME 90 tablet 3   rosuvastatin  (CRESTOR ) 20 MG tablet Take 1 tablet (20 mg total) by mouth daily. 90 tablet 3   tamsulosin  (FLOMAX ) 0.4 MG CAPS capsule Take 0.4 mg by mouth daily.     furosemide  (LASIX ) 20 MG tablet TAKE 1 TABLET BY MOUTH DAILY AS NEEDED FOR SWELLING OF LEG 90 tablet 0   nitroGLYCERIN  (NITROSTAT ) 0.4 MG SL tablet Place 1 tablet (0.4 mg total) under the tongue every 5 (five) minutes as needed for chest pain. (Patient not taking: Reported on 05/22/2024) 25 tablet 3   triamcinolone  ointment (KENALOG ) 0.5 % Apply 1 application topically 2 (two) times daily. Use as needed for skin rash (Patient not taking: Reported on 05/22/2024) 60 g 1   No current facility-administered medications for this visit.    REVIEW OF SYSTEMS:   Constitutional: ( - ) fevers, ( - )  chills , ( - ) night sweats Eyes: ( - ) blurriness of vision, ( - ) double vision, ( - ) watery eyes Ears, nose, mouth, throat, and face: ( - ) mucositis, ( - ) sore throat Respiratory: ( - ) cough, ( - ) dyspnea, ( - ) wheezes Cardiovascular: ( - ) palpitation, ( - ) chest discomfort, ( - ) lower extremity swelling Gastrointestinal:  ( - ) nausea, ( - ) heartburn, ( - ) change in bowel habits Skin: ( - ) abnormal skin rashes Lymphatics: ( - ) new lymphadenopathy, ( - ) easy bruising Neurological: ( - ) numbness, ( - ) tingling, ( - ) new weaknesses Behavioral/Psych: ( - ) mood change, ( - ) new changes  All other systems were reviewed with the patient and are  negative.  PHYSICAL EXAMINATION: ECOG PERFORMANCE STATUS: 0 -  Asymptomatic  Vitals:   05/22/24 1309  BP: (!) 143/64  Pulse: 73  Resp: 18  Temp: 97.9 F (36.6 C)  SpO2: 98%   Filed Weights   05/22/24 1309  Weight: 186 lb 12 oz (84.7 kg)    GENERAL: well appearing male in NAD  SKIN: skin color, texture, turgor are normal, no rashes or significant lesions EYES: conjunctiva are pink and non-injected, sclera clear OROPHARYNX: no exudate, no erythema; lips, buccal mucosa, and tongue normal  NECK: supple, non-tender LYMPH:  no palpable lymphadenopathy in the cervical, axillary or supraclavicular lymph nodes.  LUNGS: clear to auscultation and percussion with normal breathing effort HEART: regular rate & rhythm and no murmurs and no lower extremity edema ABDOMEN: soft, non-tender, non-distended, normal bowel sounds Musculoskeletal: no cyanosis of digits and no clubbing  PSYCH: alert & oriented x 3, fluent speech NEURO: no focal motor/sensory deficits  LABORATORY DATA:  Pending   Encounter Diagnosis  Name Primary?   Anemia, unspecified type Yes    ASSESSMENT & PLAN Tyler Estrada is a 67 y.o. caucasian male who was referred to us  for mild chronic anemia:  We had a long discussion about his medical history and medications.  He is fortunately UTD on GI work up though if iron deficiency he likely would benefit from an EDG/capsule study.  He is on Methotrexate / Mesalmine which very well may impact CBC levels He has no CKD or elevated calcium  levels suggestive for potential MGUS.  No chemotherapy history or significant XRT to suggest primary myeloproliferative concern.  Retic count is lower side of normal- no concern for hemolytic anemia We will check for nutritional deficiencies in order to optimize Hgb levels.   If labs are non-concerning he elects to be release back to his PCP for monitoring.   UPDATE: B12 is lower in nature. Iron normal. Normal kidney function. Likely cause  of his mild chronic anemia are his methotrexate  and mesalamine  with possible mild anemia secondary to B12 deficiency. Reviewed case with Dr. Maria Shiner who agrees and we would recommend 5,000 mcg of B12 once daily given his history.   All questions were answered. The patient knows to call the clinic with any problems, questions or concerns.  I have spent a total of 60 minutes minutes of face-to-face and non-face-to-face time, preparing to see the patient, obtaining and/or reviewing separately obtained history, performing a medically appropriate examination, counseling and educating the patient, ordering medications/tests/procedures, referring and communicating with other health care professionals, documenting clinical information in the electronic health record, independently interpreting results and communicating results to the patient, and care coordination.    Sunnie England PA-C Department of Hematology/Oncology [facility] at [facility]

## 2024-05-23 ENCOUNTER — Encounter: Payer: Self-pay | Admitting: *Deleted

## 2024-05-23 LAB — IRON AND IRON BINDING CAPACITY (CC-WL,HP ONLY)
Iron: 84 ug/dL (ref 45–182)
Saturation Ratios: 23 % (ref 17.9–39.5)
TIBC: 360 ug/dL (ref 250–450)
UIBC: 276 ug/dL (ref 117–376)

## 2024-05-24 ENCOUNTER — Ambulatory Visit: Payer: Self-pay | Admitting: Medical Oncology

## 2024-06-06 DIAGNOSIS — M0609 Rheumatoid arthritis without rheumatoid factor, multiple sites: Secondary | ICD-10-CM | POA: Diagnosis not present

## 2024-06-06 DIAGNOSIS — E669 Obesity, unspecified: Secondary | ICD-10-CM | POA: Diagnosis not present

## 2024-06-06 DIAGNOSIS — Z6832 Body mass index (BMI) 32.0-32.9, adult: Secondary | ICD-10-CM | POA: Diagnosis not present

## 2024-06-06 DIAGNOSIS — K5180 Other ulcerative colitis without complications: Secondary | ICD-10-CM | POA: Diagnosis not present

## 2024-06-08 ENCOUNTER — Encounter: Payer: Self-pay | Admitting: Family Medicine

## 2024-06-08 DIAGNOSIS — H5213 Myopia, bilateral: Secondary | ICD-10-CM | POA: Diagnosis not present

## 2024-06-08 DIAGNOSIS — E538 Deficiency of other specified B group vitamins: Secondary | ICD-10-CM

## 2024-07-03 ENCOUNTER — Other Ambulatory Visit (INDEPENDENT_AMBULATORY_CARE_PROVIDER_SITE_OTHER)

## 2024-07-03 DIAGNOSIS — E538 Deficiency of other specified B group vitamins: Secondary | ICD-10-CM

## 2024-07-03 LAB — CBC
HCT: 34.7 % — ABNORMAL LOW (ref 39.0–52.0)
Hemoglobin: 11.6 g/dL — ABNORMAL LOW (ref 13.0–17.0)
MCHC: 33.3 g/dL (ref 30.0–36.0)
MCV: 90.5 fl (ref 78.0–100.0)
Platelets: 212 K/uL (ref 150.0–400.0)
RBC: 3.83 Mil/uL — ABNORMAL LOW (ref 4.22–5.81)
RDW: 15.3 % (ref 11.5–15.5)
WBC: 4.7 K/uL (ref 4.0–10.5)

## 2024-07-03 LAB — VITAMIN B12: Vitamin B-12: >1500 pg/mL — ABNORMAL HIGH (ref 211–911)

## 2024-07-05 ENCOUNTER — Encounter: Payer: Self-pay | Admitting: Family Medicine

## 2024-07-05 DIAGNOSIS — D649 Anemia, unspecified: Secondary | ICD-10-CM

## 2024-07-12 ENCOUNTER — Encounter: Payer: Self-pay | Admitting: Family Medicine

## 2024-07-12 DIAGNOSIS — L570 Actinic keratosis: Secondary | ICD-10-CM | POA: Diagnosis not present

## 2024-07-12 DIAGNOSIS — L280 Lichen simplex chronicus: Secondary | ICD-10-CM | POA: Diagnosis not present

## 2024-07-12 DIAGNOSIS — Z85828 Personal history of other malignant neoplasm of skin: Secondary | ICD-10-CM | POA: Diagnosis not present

## 2024-07-12 NOTE — Addendum Note (Signed)
 Addended by: WATT RAISIN C on: 07/12/2024 04:59 PM   Modules accepted: Orders

## 2024-07-13 NOTE — Telephone Encounter (Signed)
 IFOB kit has been placed up front to mail out to pt.

## 2024-08-14 ENCOUNTER — Encounter: Payer: Self-pay | Admitting: Family Medicine

## 2024-08-14 ENCOUNTER — Other Ambulatory Visit (INDEPENDENT_AMBULATORY_CARE_PROVIDER_SITE_OTHER)

## 2024-08-14 DIAGNOSIS — D649 Anemia, unspecified: Secondary | ICD-10-CM | POA: Diagnosis not present

## 2024-08-14 LAB — FECAL OCCULT BLOOD, IMMUNOCHEMICAL: Fecal Occult Bld: NEGATIVE

## 2024-09-03 ENCOUNTER — Encounter (INDEPENDENT_AMBULATORY_CARE_PROVIDER_SITE_OTHER): Payer: Self-pay | Admitting: Family Medicine

## 2024-09-03 DIAGNOSIS — U071 COVID-19: Secondary | ICD-10-CM

## 2024-09-03 DIAGNOSIS — C61 Malignant neoplasm of prostate: Secondary | ICD-10-CM | POA: Diagnosis not present

## 2024-09-03 DIAGNOSIS — C91Z0 Other lymphoid leukemia not having achieved remission: Secondary | ICD-10-CM | POA: Diagnosis not present

## 2024-09-03 MED ORDER — NIRMATRELVIR/RITONAVIR (PAXLOVID)TABLET: 3.0000 | ORAL_TABLET | Freq: Two times a day (BID) | ORAL | 0 refills | Status: AC

## 2024-09-03 NOTE — Telephone Encounter (Signed)

## 2024-10-06 ENCOUNTER — Encounter: Payer: Self-pay | Admitting: Family Medicine

## 2024-10-21 NOTE — Progress Notes (Addendum)
 [facility] at [facility] recently [address], [address] [place], [place] [address] [phone]  Date:  10/24/2024   Name:  Tyler Estrada   DOB:  September 29, 1956   MRN:  [id number]  PCP:  Watt Harlene BROCKS, MD    Chief Complaint: Extremity Weakness (Leg weakness )   History of Present Illness:  Tyler Estrada is a 68 y.o. very pleasant male patient who presents with the following:  Patient seen today with concern of leg weakness.  I saw him most recently in May History of hypertension, CAD with RCA stent 2008, prediabetes, hyperlipidemia, vitamin D deficiency. He underwent a C4-7 decompression in April of 2022 and has done overall quite well from that standpoint Dr Gillie  He also has rheumatoid arthritis using methotrexate  per rheumatology  - Flu vaccine- will give today  -COVID booster- -Shingrix   Discussed the use of AI scribe software for clinical note transcription with the patient, who gave verbal consent to proceed.  History of Present Illness Tyler Estrada is a 68 year old male who presents with bilateral leg weakness and increased headaches.  He has been experiencing bilateral leg weakness for the past two to three weeks, describing the sensation as feeling like 'jelly'. The weakness occurs without a specific pattern related to time of day or activity. No falls, numbness, or pain in the legs. The weakness was first noticed while on a cruise, and he sometimes feels as though he could fall easily when standing up.  No bowel or bladder incontinence  He also reports weakness in his right arm, describing it as feeling 'a little weird' and not as strong as his left arm. There is no numbness or history of dropping objects more than usual. This is the first occurrence of these symptoms.  He mentions experiencing more headaches than usual, which he describes as quick headaches lasting about a minute. These occur off and on, but not  daily. No associated vomiting or vision changes.  He has a history of neck surgery and has undergone physical therapy earlier this year for back issues. He recalls having an MRI of his cervical spine in March, which was followed by physical therapy. However, he does not recall having an MRI or x-rays of his lower back.  He does note he has some unusual symptoms somewhat like what he is experiencing now prior to his neck decompression  He is currently on methotrexate  for rheumatoid arthritis, with no recent changes in dosage.  He recently returned from a [place] cruise and plans to go on a [place] cruise in four days.    Patient Active Problem List   Diagnosis Date Noted   Drug-induced gynecomastia 10/15/2022   Pre-op evaluation 12/01/2020   Essential hypertension 12/01/2020   Cervical spondylosis with myelopathy and radiculopathy 03/26/2020   Prostate cancer (HCC) 04/12/2019   Obese 12/01/2018   S/P left UKR 11/30/2018   H/O pericarditis    Elevated troponin 11/19/2015   Dyslipidemia, goal LDL below 70    CAD S/P percutaneous coronary angioplasty 11/18/2015   Chest pain 11/18/2015   Iron deficiency anemia due to chronic blood loss 09/10/2015   Ulcerative colitis with complication (HCC) 09/10/2015    Past Medical History:  Diagnosis Date   Arthritis    knees, hips, hands   Complication of anesthesia    Woke up during hip replacement, had surgery since with no problems   Coronary artery disease    a.  s/p DES to RCA in 2008;  b. 10/2015 Cath: LM nl, LAD 50ost/p, OM1/2 min irregs, RCA 70m ISR, 24m/d ISR, EF 65%.   Echocardiogram    Echo 11/16: EF 60-65, normal wall motion, grade 1 diastolic dysfunction   Grade I diastolic dysfunction 11/19/2015   ECHO   Hyperlipidemia    Iron deficiency anemia due to chronic blood loss 09/10/2015   Melanoma (HCC)    Nose   Pericarditis    a. 10/2015->colchicine .   Prostate cancer (HCC)    Ulcerative colitis with complication (HCC)  09/10/2015    Past Surgical History:  Procedure Laterality Date   ANTERIOR CERVICAL DECOMP/DISCECTOMY FUSION N/A 03/26/2020   Procedure: Cervical Four-Five, Cervical Five-Six, Cervical Six-Seven Anterior Cervical Decompression/Discectomy/Fusion;  Surgeon: Gillie Duncans, MD;  Location: [facility] OR;  Service: Neurosurgery;  Laterality: N/A;  3C   CARDIAC CATHETERIZATION N/A 11/18/2015   Procedure: Left Heart Cath and Coronary Angiography;  Surgeon: Ozell Fell, MD;  Location: [facility] INVASIVE CV LAB;  Service: Cardiovascular;  Laterality: N/A;   CARPAL TUNNEL RELEASE Right 03/26/2020   Procedure: CARPAL TUNNEL RELEASE;  Surgeon: Gillie Duncans, MD;  Location: [facility] OR;  Service: Neurosurgery;  Laterality: Right;   CERVICAL SPINE SURGERY     COLONOSCOPY  2019   CORONARY ANGIOPLASTY WITH STENT PLACEMENT  2008   a. Cordis stent to RCA in 2008 2.56mm x 18mm   HIP ARTHROPLASTY Right    MOHS SURGERY     PARTIAL KNEE ARTHROPLASTY Left 11/30/2018   Procedure: LEFT UNICOMPARTMENTAL KNEE Medially;  Surgeon: Ernie Cough, MD;  Location: [facility] ORS;  Service: Orthopedics;  Laterality: Left;  70 mins with block   RADIOACTIVE SEED IMPLANT N/A 08/10/2019   Procedure: RADIOACTIVE SEED IMPLANT/BRACHYTHERAPY IMPLANT;  Surgeon: Alvaro Hummer, MD;  Location: [facility];  Service: Urology;  Laterality: N/A;   SPACE OAR INSTILLATION N/A 08/10/2019   Procedure: SPACE OAR INSTILLATION;  Surgeon: Alvaro Hummer, MD;  Location: [facility];  Service: Urology;  Laterality: N/A;   TONSILLECTOMY     childhood    Social History   Tobacco Use   Smoking status: Never   Smokeless tobacco: Never  Vaping Use   Vaping status: Never Used  Substance Use Topics   Alcohol use: Yes    Alcohol/week: 0.0 standard drinks of alcohol    Comment: 1-2 drinks per week.   Drug use: No    Family History  Problem Relation Age of Onset   Lymphoma Mother    Hypertension Mother    Arthritis Mother    Cancer Mother     CAD Father    Heart disease Father    Heart attack Father    Arthritis Sister    Heart disease Sister    Hypertension Brother     Allergies  Allergen Reactions   Prednisone      Blurred vision     Medication list has been reviewed and updated.  Current Outpatient Medications on File Prior to Visit  Medication Sig Dispense Refill   aspirin  EC 81 MG tablet Take 81 mg by mouth at bedtime.      cephALEXin  (KEFLEX ) 500 MG capsule Take 1 capsule (500 mg total) by mouth 3 (three) times daily. 15 capsule 0   folic acid  (FOLVITE ) 1 MG tablet Take 1 mg by mouth daily.     furosemide  (LASIX ) 20 MG tablet TAKE 1 TABLET BY MOUTH DAILY AS NEEDED FOR SWELLING OF LEG 90 tablet 0   loratadine  (CLARITIN ) 10 MG  tablet Take 10 mg by mouth daily.     meloxicam  (MOBIC ) 7.5 MG tablet Take 1 tablet (7.5 mg total) by mouth daily. Use as needed for arthritis 30 tablet 6   mesalamine  (LIALDA ) 1.2 g EC tablet Take 1.2 g by mouth in the morning and at bedtime. Takes two twice daily     methotrexate  (RHEUMATREX) 2.5 MG tablet Take 6 tablets (15 mg total) by mouth once a week. Caution:Chemotherapy. Protect from light. (Patient taking differently: Take 15 mg by mouth once a week. Caution:Chemotherapy. Protect from light. Takes 8 tablets weekly on Wednesday) 20 tablet 0   montelukast  (SINGULAIR ) 10 MG tablet TAKE 1 TABLET BY MOUTH EVERY NIGHT AT BEDTIME 90 tablet 3   nitroGLYCERIN  (NITROSTAT ) 0.4 MG SL tablet Place 1 tablet (0.4 mg total) under the tongue every 5 (five) minutes as needed for chest pain. (Patient not taking: Reported on 05/22/2024) 25 tablet 3   rosuvastatin  (CRESTOR ) 20 MG tablet Take 1 tablet (20 mg total) by mouth daily. 90 tablet 3   tamsulosin  (FLOMAX ) 0.4 MG CAPS capsule Take 0.4 mg by mouth daily.     triamcinolone  ointment (KENALOG ) 0.5 % Apply 1 application topically 2 (two) times daily. Use as needed for skin rash (Patient not taking: Reported on 05/22/2024) 60 g 1   No current  facility-administered medications on file prior to visit.    Review of Systems:  As per HPI- otherwise negative.   Physical Examination: Vitals:   10/24/24 1126  BP: 122/78  Pulse: 74  SpO2: 98%   Vitals:   10/24/24 1126  Weight: 196 lb 3.2 oz (89 kg)  Height: 5' 2 (1.575 m)   Body mass index is 35.89 kg/m. Ideal Body Weight: Weight in (lb) to have BMI = 25: 136.4  GEN: no acute distress.  Mildly obese, looks well and his normal self HEENT: Atraumatic, Normocephalic.  Ears and Nose: No external deformity. CV: RRR, No M/G/R. No JVD. No thrill. No extra heart sounds. PULM: CTA B, no wheezes, crackles, rhonchi. No retractions. No resp. distress. No accessory muscle use. ABD: S, NT, ND EXTR: No c/c/e PSYCH: Normally interactive. Conversant.  Strength of all limbs is grossly normal on exam today.  He notes symmetrical sensation in his right and left sides.  Patellar reflexes are suppressed bilaterally.  Assessment and Plan: B12 deficiency - Plan: Vitamin B12  Mild chronic anemia - Plan: CBC  Weakness of both lower extremities - Plan: DG Lumbar Spine Complete, DG Thoracic Spine 2 View, CBC, Comprehensive metabolic panel with GFR, TSH  Headache, new daily persistent (NDPH) - Plan: CT HEAD WO CONTRAST ( )  Need for influenza vaccination - Plan: Flu vaccine HIGH DOSE PF(Fluzone Trivalent)  Assessment & Plan Bilateral lower extremity weakness and unsteadiness Weakness of both legs and feeling of unsteadiness for 2-3 weeks without pain or numbness. Differential includes deconditioning, spinal stenosis, or other spinal issues. Previous neck surgery noted. No stroke or significant asymmetry observed. - Order x-rays of thoracic and lumbar spine. - Consider MRI of lumbar spine after vacation if x-rays show significant changes. - Order CT of head to rule out any acute intracranial issues. - Perform blood tests to check B12 levels and other labs before travel.  Deficiency of B  group vitamins Monitoring B12 levels due to previous deficiency. Potential contribution to weakness and unsteadiness considered. - Perform blood tests to check B12 levels.  Headache Intermittent headaches without vomiting or vision changes. Likely benign. CT of head ordered due to  upcoming travel. - Order CT of head to rule out any acute intracranial issues.  Rheumatoid arthritis Managed with methotrexate . No recent changes in medication or symptoms.  General Health Maintenance Discussed flu vaccination status. Last flu shot likely last year. No recent COVID vaccination reported. - Administer flu shot.  Signed Harlene Schroeder, MD  Received imaging as below, message to patient CT HEAD WO CONTRAST ( ) Result Date: 10/24/2024 EXAM: CT HEAD WITHOUT CONTRAST 10/24/2024 04:27:52 PM TECHNIQUE: CT of the head was performed without the administration of intravenous contrast. Automated exposure control, iterative reconstruction, and/or weight based adjustment of the mA/kV was utilized to reduce the radiation dose to as low as reasonably achievable. COMPARISON: MRI head 05/06/2022. CLINICAL HISTORY: Dizziness and headaches for a few months. Change in headache pattern. FINDINGS: BRAIN AND VENTRICLES: There is no evidence of an acute infarct, intracranial hemorrhage, mass, midline shift, hydrocephalus, or extra-axial fluid collection. Cerebral volume is within normal limits for age. Calcified atherosclerosis at the skull base. ORBITS: No acute abnormality. SINUSES: Mild scattered bilateral ethmoid air cell opacification. Clear mastoid air cells. SOFT TISSUES AND SKULL: No acute soft tissue abnormality. No skull fracture. IMPRESSION: 1. No acute intracranial abnormality. Electronically signed by: Dasie Hamburg MD 10/24/2024 04:37 PM EST RP Workstation: [id number]   DG Lumbar Spine Complete Result Date: 10/24/2024 CLINICAL DATA:  Bilateral lower extremity weakness. EXAM: LUMBAR SPINE - COMPLETE 4+ VIEW  COMPARISON:  None Available. FINDINGS: Five lumbar type vertebra. There is no acute fracture or subluxation of the lumbar spine. Lower lumbar facet arthropathy. There is L5 pars defect grade 1 L5-S1 anterolisthesis. Partially visualized total right hip arthroplasty. Multiple gallstones noted. IMPRESSION: 1. No acute findings. 2. L5 pars defect with grade 1 L5-S1 anterolisthesis. 3. Cholelithiasis. Electronically Signed   By: Vanetta Chou M.D.   On: 10/24/2024 13:47   DG Thoracic Spine 2 View Result Date: 10/24/2024 EXAM: 2 VIEW(S) XRAY OF THE THORACIC SPINE 10/24/2024 01:01:28 PM COMPARISON: Previous exams are available. CLINICAL HISTORY: weakness in bilateral legs FINDINGS: BONES: Minimal dextroscoliosis of upper thoracic spine. Alignment is normal. No acute fracture. No aggressive appearing osseous lesion. DISCS AND DEGENERATIVE CHANGES: Multilevel degenerative changes are noted in the lower thoracic spine. SOFT TISSUES: The visualized lungs are clear. IMPRESSION: 1. No acute thoracic spine abnormality identified. Electronically signed by: Lynwood Seip MD 10/24/2024 01:47 PM EST RP Workstation: [id number]   Addendum 11/6, received labs as below.  Message to patient  Results for orders placed or performed in visit on 10/24/24  Vitamin B12   Collection Time: 10/24/24 11:53 AM  Result Value Ref Range   Vitamin B-12 628 211 - 911 pg/mL  CBC   Collection Time: 10/24/24 11:53 AM  Result Value Ref Range   WBC 4.0 4.0 - 10.5 K/uL   RBC 4.14 (L) 4.22 - 5.81 Mil/uL   Platelets 213.0 150.0 - 400.0 K/uL   Hemoglobin 12.3 (L) 13.0 - 17.0 g/dL   HCT 62.7 (L) 60.9 - 47.9 %   MCV 89.9 78.0 - 100.0 fl   MCHC 33.1 30.0 - 36.0 g/dL   RDW 84.7 88.4 - 84.4 %  Comprehensive metabolic panel with GFR   Collection Time: 10/24/24 11:53 AM  Result Value Ref Range   Sodium 141 135 - 145 mEq/L   Potassium 4.0 3.5 - 5.1 mEq/L   Chloride 107 96 - 112 mEq/L   CO2 24 19 - 32 mEq/L   Glucose, Bld 94 70 - 99 mg/dL    BUN 15 6 -  23 mg/dL   Creatinine, Ser 9.28 0.40 - 1.50 mg/dL   Total Bilirubin 0.6 0.2 - 1.2 mg/dL   Alkaline Phosphatase 74 39 - 117 U/L   AST 17 0 - 37 U/L   ALT 15 0 - 53 U/L   Total Protein 6.7 6.0 - 8.3 g/dL   Albumin  4.4 3.5 - 5.2 g/dL   GFR 05.30 >39.99 mL/min   Calcium  8.9 8.4 - 10.5 mg/dL  TSH   Collection Time: 10/24/24 11:53 AM  Result Value Ref Range   TSH 2.15 0.35 - 5.50 uIU/mL

## 2024-10-24 ENCOUNTER — Encounter: Payer: Self-pay | Admitting: Family Medicine

## 2024-10-24 ENCOUNTER — Ambulatory Visit (INDEPENDENT_AMBULATORY_CARE_PROVIDER_SITE_OTHER): Admitting: Family Medicine

## 2024-10-24 ENCOUNTER — Ambulatory Visit (HOSPITAL_BASED_OUTPATIENT_CLINIC_OR_DEPARTMENT_OTHER)
Admission: RE | Admit: 2024-10-24 | Discharge: 2024-10-24 | Disposition: A | Discharge status: Home / Self Care | Source: Ambulatory Visit | Attending: Family Medicine | Admitting: Family Medicine

## 2024-10-24 VITALS — BP 122/78 | HR 74 | Ht 62.0 in | Wt 196.2 lb

## 2024-10-24 DIAGNOSIS — D649 Anemia, unspecified: Secondary | ICD-10-CM | POA: Diagnosis not present

## 2024-10-24 DIAGNOSIS — Z23 Encounter for immunization: Secondary | ICD-10-CM | POA: Diagnosis not present

## 2024-10-24 DIAGNOSIS — M47814 Spondylosis without myelopathy or radiculopathy, thoracic region: Secondary | ICD-10-CM | POA: Diagnosis not present

## 2024-10-24 DIAGNOSIS — R29898 Other symptoms and signs involving the musculoskeletal system: Secondary | ICD-10-CM | POA: Diagnosis not present

## 2024-10-24 DIAGNOSIS — G4452 New daily persistent headache (NDPH): Secondary | ICD-10-CM

## 2024-10-24 DIAGNOSIS — K8020 Calculus of gallbladder without cholecystitis without obstruction: Secondary | ICD-10-CM | POA: Diagnosis not present

## 2024-10-24 DIAGNOSIS — Z96641 Presence of right artificial hip joint: Secondary | ICD-10-CM | POA: Diagnosis not present

## 2024-10-24 DIAGNOSIS — R519 Headache, unspecified: Secondary | ICD-10-CM | POA: Diagnosis not present

## 2024-10-24 DIAGNOSIS — E538 Deficiency of other specified B group vitamins: Secondary | ICD-10-CM

## 2024-10-24 DIAGNOSIS — M47816 Spondylosis without myelopathy or radiculopathy, lumbar region: Secondary | ICD-10-CM | POA: Diagnosis not present

## 2024-10-24 DIAGNOSIS — R42 Dizziness and giddiness: Secondary | ICD-10-CM | POA: Diagnosis not present

## 2024-10-24 DIAGNOSIS — M4319 Spondylolisthesis, multiple sites in spine: Secondary | ICD-10-CM | POA: Diagnosis not present

## 2024-10-24 DIAGNOSIS — M419 Scoliosis, unspecified: Secondary | ICD-10-CM | POA: Diagnosis not present

## 2024-10-24 LAB — CBC
HCT: 37.2 % — ABNORMAL LOW (ref 39.0–52.0)
Hemoglobin: 12.3 g/dL — ABNORMAL LOW (ref 13.0–17.0)
MCHC: 33.1 g/dL (ref 30.0–36.0)
MCV: 89.9 fl (ref 78.0–100.0)
Platelets: 213 K/uL (ref 150.0–400.0)
RBC: 4.14 Mil/uL — ABNORMAL LOW (ref 4.22–5.81)
RDW: 15.2 % (ref 11.5–15.5)
WBC: 4 K/uL (ref 4.0–10.5)

## 2024-10-24 LAB — COMPREHENSIVE METABOLIC PANEL WITH GFR
ALT: 15 U/L (ref 0–53)
AST: 17 U/L (ref 0–37)
Albumin: 4.4 g/dL (ref 3.5–5.2)
Alkaline Phosphatase: 74 U/L (ref 39–117)
BUN: 15 mg/dL (ref 6–23)
CO2: 24 meq/L (ref 19–32)
Calcium: 8.9 mg/dL (ref 8.4–10.5)
Chloride: 107 meq/L (ref 96–112)
Creatinine, Ser: 0.71 mg/dL (ref 0.40–1.50)
GFR: 94.69 mL/min (ref >60.00)
Glucose, Bld: 94 mg/dL (ref 70–99)
Potassium: 4 meq/L (ref 3.5–5.1)
Sodium: 141 meq/L (ref 135–145)
Total Bilirubin: 0.6 mg/dL (ref 0.2–1.2)
Total Protein: 6.7 g/dL (ref 6.0–8.3)

## 2024-10-24 NOTE — Patient Instructions (Signed)
 Good to see you today- I will be in touch with your imaging results and labs asap and we will plan the next step Please let me know if anything is changing or getting worse  Flu shot today

## 2024-10-25 ENCOUNTER — Encounter: Payer: Self-pay | Admitting: Family Medicine

## 2024-10-25 ENCOUNTER — Ambulatory Visit (HOSPITAL_BASED_OUTPATIENT_CLINIC_OR_DEPARTMENT_OTHER)

## 2024-10-25 DIAGNOSIS — M4306 Spondylolysis, lumbar region: Secondary | ICD-10-CM

## 2024-10-25 DIAGNOSIS — R29898 Other symptoms and signs involving the musculoskeletal system: Secondary | ICD-10-CM

## 2024-10-25 LAB — TSH: TSH: 2.15 u[IU]/mL (ref 0.35–5.50)

## 2024-10-25 LAB — VITAMIN B12: Vitamin B-12: 628 pg/mL (ref 211–911)

## 2024-10-26 NOTE — Addendum Note (Signed)
 Addended by: WATT RAISIN C on: 10/26/2024 05:19 PM   Modules accepted: Orders

## 2024-11-02 ENCOUNTER — Encounter: Payer: Self-pay | Admitting: Family Medicine

## 2024-11-03 ENCOUNTER — Encounter: Payer: Self-pay | Admitting: Family Medicine

## 2024-11-07 ENCOUNTER — Encounter: Payer: Self-pay | Admitting: Family Medicine

## 2024-11-09 NOTE — Progress Notes (Signed)
 [facility] [address] [place], [place] [address] [phone]  Date:  11/12/2024   Name:  Tyler Estrada   DOB:  [birth year]   MRN:  [id number]  PCP:  Watt Harlene BROCKS, MD    Chief Complaint: Follow-up (Follow up on back pain it still hurts, doesn't seem to be any better. The pain is not creeping up into my neck.)   History of Present Illness:  Tyler Estrada is a 68 y.o. very pleasant male patient who presents with the following:  Patient seen today for a recheck of back pain.  I saw him in the office on 11/5 at which time he was concerned about bilateral leg weakness for 2 or 3 weeks. I got lab work, a CT of his head and plain films of the lumbar spine.  He does have an L5 pars defect Plain films of the thoracic spine were normal  I touched base with his neurosurgeon who did his previous cervical spine surgery, he wanted to get a lumbar MRI.  However as of yet we have not been able to get this approved by his insurance  Pt's insurance is requesting more information, please advise. All prior medical notes had already been sent. In order to process this case, please provide Detailed neuro exam and 6-week trial of provider directed treatment (within 12 weeks) and clinical re-evaluation may be helpful related to this  [id number] MRI L SPINE W/O CONTRAST request.    Discussed the use of AI scribe software for clinical note transcription with the patient, who gave verbal consent to proceed.  History of Present Illness Tyler Estrada is a 68 year old male with a PARS defect in the lumbar spine who presents with bilateral leg weakness and back pain.  He has been experiencing bilateral leg weakness for several weeks, describing his legs as feeling 'weak' and 'jellyish.' He sometimes experiences foot drop in the right foot, causing his foot to catch on the ground and leading to stumbles.  He has not fallen.  No  numbness, pain, or cramping in the legs is reported, and the weakness improves with rest but does not completely resolve. He is not using any specific medications for this issue.  He has a history of a PARS defect in the lumbar spine, which was found on x-rays. An MRI has not yet been approved. He experiences lower back pain and describes a sensation of clicking in his back when walking. He regularly uses a heating pad to manage his back discomfort.  Recently, he experienced a hot, burning sensation down the middle of his back, described as an 'electrical' or 'singing' feeling, which lasted for about a minute and has not recurred. He also reports neck pain with a crunching sound when moving his neck back and forth.  His symptoms, particularly the foot drop and leg weakness, have developed over the past year, with his toes catching more frequently, leading to stumbles. He has not experienced any falls but is concerned about the progression of these symptoms.    Patient Active Problem List   Diagnosis Date Noted   Drug-induced gynecomastia 10/15/2022   Pre-op evaluation 12/01/2020   Essential hypertension 12/01/2020   Cervical spondylosis with myelopathy and radiculopathy 03/26/2020   Prostate cancer (HCC) 04/12/2019   Obese 12/01/2018   S/P left UKR 11/30/2018   H/O pericarditis    Elevated troponin 11/19/2015   Dyslipidemia, goal LDL below 70  CAD S/P percutaneous coronary angioplasty 11/18/2015   Chest pain 11/18/2015   Iron deficiency anemia due to chronic blood loss 09/10/2015   Ulcerative colitis with complication (HCC) 09/10/2015    Past Medical History:  Diagnosis Date   Arthritis    knees, hips, hands   Complication of anesthesia    Woke up during hip replacement, had surgery since with no problems   Coronary artery disease    a. s/p DES to RCA in 2008;  b. 10/2015 Cath: LM nl, LAD 50ost/p, OM1/2 min irregs, RCA 47m ISR, 21m/d ISR, EF 65%.   Echocardiogram    Echo 11/16:  EF 60-65, normal wall motion, grade 1 diastolic dysfunction   Grade I diastolic dysfunction 11/19/2015   ECHO   Hyperlipidemia    Iron deficiency anemia due to chronic blood loss 09/10/2015   Melanoma (HCC)    Nose   Pericarditis    a. 10/2015->colchicine .   Prostate cancer (HCC)    Ulcerative colitis with complication (HCC) 09/10/2015    Past Surgical History:  Procedure Laterality Date   ANTERIOR CERVICAL DECOMP/DISCECTOMY FUSION N/A 03/26/2020   Procedure: Cervical Four-Five, Cervical Five-Six, Cervical Six-Seven Anterior Cervical Decompression/Discectomy/Fusion;  Surgeon: Gillie Duncans, MD;  Location: [facility] OR;  Service: Neurosurgery;  Laterality: N/A;  3C   CARDIAC CATHETERIZATION N/A 11/18/2015   Procedure: Left Heart Cath and Coronary Angiography;  Surgeon: Ozell Fell, MD;  Location: [facility] INVASIVE CV LAB;  Service: Cardiovascular;  Laterality: N/A;   CARPAL TUNNEL RELEASE Right 03/26/2020   Procedure: CARPAL TUNNEL RELEASE;  Surgeon: Gillie Duncans, MD;  Location: [facility] OR;  Service: Neurosurgery;  Laterality: Right;   CERVICAL SPINE SURGERY     COLONOSCOPY  2019   CORONARY ANGIOPLASTY WITH STENT PLACEMENT  2008   a. Cordis stent to RCA in 2008 2.75mm x 18mm   HIP ARTHROPLASTY Right    MOHS SURGERY     PARTIAL KNEE ARTHROPLASTY Left 11/30/2018   Procedure: LEFT UNICOMPARTMENTAL KNEE Medially;  Surgeon: Ernie Cough, MD;  Location: [facility] ORS;  Service: Orthopedics;  Laterality: Left;  70 mins with block   RADIOACTIVE SEED IMPLANT N/A 08/10/2019   Procedure: RADIOACTIVE SEED IMPLANT/BRACHYTHERAPY IMPLANT;  Surgeon: Alvaro Hummer, MD;  Location: [facility];  Service: Urology;  Laterality: N/A;   SPACE OAR INSTILLATION N/A 08/10/2019   Procedure: SPACE OAR INSTILLATION;  Surgeon: Alvaro Hummer, MD;  Location: [facility];  Service: Urology;  Laterality: N/A;   TONSILLECTOMY     childhood    Social History   Tobacco Use   Smoking status: Never    Smokeless tobacco: Never  Vaping Use   Vaping status: Never Used  Substance Use Topics   Alcohol use: Yes    Alcohol/week: 0.0 standard drinks of alcohol    Comment: 1-2 drinks per week.   Drug use: No    Family History  Problem Relation Age of Onset   Lymphoma Mother    Hypertension Mother    Arthritis Mother    Cancer Mother    CAD Father    Heart disease Father    Heart attack Father    Arthritis Sister    Heart disease Sister    Hypertension Brother     Allergies  Allergen Reactions   Prednisone      Blurred vision     Medication list has been reviewed and updated.  Current Outpatient Medications on File Prior to Visit  Medication Sig Dispense Refill   aspirin  EC 81 MG tablet  Take 81 mg by mouth at bedtime.      cephALEXin  (KEFLEX ) 500 MG capsule Take 1 capsule (500 mg total) by mouth 3 (three) times daily. 15 capsule 0   folic acid  (FOLVITE ) 1 MG tablet Take 1 mg by mouth daily.     furosemide  (LASIX ) 20 MG tablet TAKE 1 TABLET BY MOUTH DAILY AS NEEDED FOR SWELLING OF LEG 90 tablet 0   loratadine  (CLARITIN ) 10 MG tablet Take 10 mg by mouth daily.     meloxicam  (MOBIC ) 7.5 MG tablet Take 1 tablet (7.5 mg total) by mouth daily. Use as needed for arthritis 30 tablet 6   mesalamine  (LIALDA ) 1.2 g EC tablet Take 1.2 g by mouth in the morning and at bedtime. Takes two twice daily     methotrexate  (RHEUMATREX) 2.5 MG tablet Take 6 tablets (15 mg total) by mouth once a week. Caution:Chemotherapy. Protect from light. (Patient taking differently: Take 15 mg by mouth once a week. Caution:Chemotherapy. Protect from light. Takes 8 tablets weekly on Wednesday) 20 tablet 0   montelukast  (SINGULAIR ) 10 MG tablet TAKE 1 TABLET BY MOUTH EVERY NIGHT AT BEDTIME 90 tablet 3   rosuvastatin  (CRESTOR ) 20 MG tablet Take 1 tablet (20 mg total) by mouth daily. 90 tablet 3   tamsulosin  (FLOMAX ) 0.4 MG CAPS capsule Take 0.4 mg by mouth daily.     triamcinolone  ointment (KENALOG ) 0.5 % Apply 1  application topically 2 (two) times daily. Use as needed for skin rash (Patient not taking: Reported on 11/12/2024) 60 g 1   No current facility-administered medications on file prior to visit.    Review of Systems:  As per HPI- otherwise negative.   Physical Examination: Vitals:   11/12/24 1136  BP: 136/82  Pulse: 73  Temp: 97.8 F (36.6 C)  SpO2: 97%   Vitals:   11/12/24 1136  Weight: 195 lb 9.6 oz (88.7 kg)  Height: 5' 2 (1.575 m)   Body mass index is 35.78 kg/m. Ideal Body Weight: Weight in (lb) to have BMI = 25: 136.4  GEN: no acute distress.  Mild obesity, looks well HEENT: Atraumatic, Normocephalic.  Ears and Nose: No external deformity.SABRA EXTR: No c/c/e PSYCH: Normally interactive. Conversant.  There is palpable spasm of the paraspinous muscles at the lumbar level.  He has decreased plantarflexion strength on the right and decreased ability to press up onto his toes on the right side Quad and hamstring strength is normal Thoracolumbar flexion and extension is mildly reduced Sensation is equal on both sides  Assessment and Plan: Weakness of both lower extremities  Pars defect of lumbar spine  CAD S/P percutaneous coronary angioplasty - Plan: nitroGLYCERIN  (NITROSTAT ) 0.4 MG SL tablet  Foot drop, right  Assessment & Plan Lumbar spondylolysis with chronic low back pain and bilateral lower extremity weakness Chronic low back pain with bilateral lower extremity weakness, for about 6 weeks.  He has now also developed foot drop for the last 3 to 4 weeks no numbness. Weakness improves with rest. Burning sensation in back suggests possible nerve compression from bulging disc. MRI needed for assessment especially given finding of foot drop - Obtain MRI to assess for nerve compression. - Consider referral to [facility] for expedited MRI. - Discussed potential use of ankle-foot orthosis (AFO) if foot drop becomes chronic.  Signed Harlene Schroeder, MD "

## 2024-11-12 ENCOUNTER — Encounter: Payer: Self-pay | Admitting: Family Medicine

## 2024-11-12 ENCOUNTER — Ambulatory Visit: Admitting: Family Medicine

## 2024-11-12 VITALS — BP 136/82 | HR 73 | Temp 97.8°F | Ht 62.0 in | Wt 195.6 lb

## 2024-11-12 DIAGNOSIS — M4306 Spondylolysis, lumbar region: Secondary | ICD-10-CM | POA: Diagnosis not present

## 2024-11-12 DIAGNOSIS — Z9861 Coronary angioplasty status: Secondary | ICD-10-CM | POA: Diagnosis not present

## 2024-11-12 DIAGNOSIS — M21371 Foot drop, right foot: Secondary | ICD-10-CM | POA: Diagnosis not present

## 2024-11-12 DIAGNOSIS — I2510 Atherosclerotic heart disease of native coronary artery without angina pectoris: Secondary | ICD-10-CM | POA: Diagnosis not present

## 2024-11-12 DIAGNOSIS — R29898 Other symptoms and signs involving the musculoskeletal system: Secondary | ICD-10-CM | POA: Diagnosis not present

## 2024-11-12 MED ORDER — NITROGLYCERIN 0.4 MG SL SUBL: 0.4000 mg | SUBLINGUAL_TABLET | SUBLINGUAL | 3 refills | Status: AC | PRN

## 2024-11-14 NOTE — Progress Notes (Addendum)
 Tyler Estrada                                          MRN: [id number]   11/14/2024   The [facility] Quality Team Specialist reviewed this patient medical record for the purposes of chart review for care gap closure. The following were reviewed: abstraction for care gap closure-controlling blood pressure.  01/15/2025- no new cbp to close 2025    [facility] Quality Team

## 2024-11-16 ENCOUNTER — Telehealth: Payer: Self-pay | Admitting: Family Medicine

## 2024-11-16 NOTE — Telephone Encounter (Signed)
 I called [facility] today regarding MRI denial.  They are closed for the day after Thanksgiving?  Per message this case is final and there is no appeal process.  I will try them on Monday and see if I can get a peer to peer- however I am not sure if this will help.  I will reach out to his neurosurgeon as well and see if he might be able to order the MRI

## 2024-11-19 ENCOUNTER — Telehealth: Payer: Self-pay | Admitting: Family Medicine

## 2024-11-19 NOTE — Telephone Encounter (Signed)
 I called [facility] regarding denial of MRI. I am not able to do a Peer to peer for this case.  I am instructed to send a letter of medical necessity to the urgent appeals dept which I will do today  Fax to [phone] Case # [id number]  ([facility] case number is different [id number])

## 2024-11-21 DIAGNOSIS — M0609 Rheumatoid arthritis without rheumatoid factor, multiple sites: Secondary | ICD-10-CM | POA: Diagnosis not present

## 2024-11-21 DIAGNOSIS — K5180 Other ulcerative colitis without complications: Secondary | ICD-10-CM | POA: Diagnosis not present

## 2024-11-21 DIAGNOSIS — E669 Obesity, unspecified: Secondary | ICD-10-CM | POA: Diagnosis not present

## 2024-11-21 DIAGNOSIS — Z6834 Body mass index (BMI) 34.0-34.9, adult: Secondary | ICD-10-CM | POA: Diagnosis not present

## 2024-11-22 LAB — CBC AND DIFFERENTIAL
HCT: 36 — AB (ref 41–53)
Hemoglobin: 11.9 — AB (ref 13.5–17.5)
Platelets: 202 K/uL (ref 150–400)
WBC: 5.2

## 2024-11-22 LAB — BASIC METABOLIC PANEL WITH GFR
BUN: 18 (ref 4–21)
CO2: 22 (ref 13–22)
Chloride: 106 (ref 99–108)
Creatinine: 0.8 (ref 0.6–1.3)
Glucose: 97
Potassium: 4.4 meq/L (ref 3.5–5.1)
Sodium: 141 (ref 137–147)

## 2024-11-22 LAB — HEPATIC FUNCTION PANEL
ALT: 18 U/L (ref 10–40)
AST: 20 (ref 14–40)
Alkaline Phosphatase: 86 (ref 25–125)
Bilirubin, Total: 0.4

## 2024-11-22 LAB — COMPREHENSIVE METABOLIC PANEL WITH GFR
Albumin: 4.4 (ref 3.5–5.0)
Calcium: 9 (ref 8.7–10.7)
eGFR: 99

## 2024-12-11 ENCOUNTER — Encounter: Payer: Self-pay | Admitting: Family Medicine

## 2024-12-14 ENCOUNTER — Ambulatory Visit (HOSPITAL_COMMUNITY)
Admission: RE | Admit: 2024-12-14 | Discharge: 2024-12-14 | Disposition: A | Discharge status: Home / Self Care | Source: Ambulatory Visit | Attending: Family Medicine | Admitting: Family Medicine

## 2024-12-14 DIAGNOSIS — M4306 Spondylolysis, lumbar region: Secondary | ICD-10-CM | POA: Diagnosis present

## 2024-12-14 DIAGNOSIS — M4316 Spondylolisthesis, lumbar region: Secondary | ICD-10-CM | POA: Diagnosis not present

## 2024-12-14 DIAGNOSIS — R29898 Other symptoms and signs involving the musculoskeletal system: Secondary | ICD-10-CM | POA: Diagnosis present

## 2024-12-14 DIAGNOSIS — M21371 Foot drop, right foot: Secondary | ICD-10-CM | POA: Diagnosis present

## 2024-12-25 ENCOUNTER — Encounter: Payer: Self-pay | Admitting: Family Medicine

## 2024-12-31 ENCOUNTER — Other Ambulatory Visit: Payer: Self-pay | Admitting: Family Medicine

## 2024-12-31 DIAGNOSIS — L309 Dermatitis, unspecified: Secondary | ICD-10-CM

## 2025-01-23 ENCOUNTER — Ambulatory Visit: Admitting: Family Medicine

## 2025-01-30 ENCOUNTER — Ambulatory Visit: Admitting: Family Medicine

## 2025-04-18 ENCOUNTER — Ambulatory Visit
